# Patient Record
Sex: Female | Born: 1935 | Race: White | Hispanic: No | Marital: Married | Smoking: Current every day smoker
Health system: Southern US, Community
[De-identification: ages and names within clinical notes are randomized; demographics above are authoritative.]

## PROBLEM LIST (undated history)

## (undated) DIAGNOSIS — E785 Hyperlipidemia, unspecified: Secondary | ICD-10-CM

## (undated) DIAGNOSIS — J449 Chronic obstructive pulmonary disease, unspecified: Secondary | ICD-10-CM

## (undated) DIAGNOSIS — I639 Cerebral infarction, unspecified: Secondary | ICD-10-CM

## (undated) DIAGNOSIS — H9190 Unspecified hearing loss, unspecified ear: Secondary | ICD-10-CM

## (undated) DIAGNOSIS — C4490 Unspecified malignant neoplasm of skin, unspecified: Secondary | ICD-10-CM

## (undated) DIAGNOSIS — C569 Malignant neoplasm of unspecified ovary: Secondary | ICD-10-CM

## (undated) DIAGNOSIS — C801 Malignant (primary) neoplasm, unspecified: Secondary | ICD-10-CM

## (undated) DIAGNOSIS — I714 Abdominal aortic aneurysm, without rupture, unspecified: Secondary | ICD-10-CM

## (undated) DIAGNOSIS — R55 Syncope and collapse: Secondary | ICD-10-CM

## (undated) DIAGNOSIS — M810 Age-related osteoporosis without current pathological fracture: Secondary | ICD-10-CM

## (undated) DIAGNOSIS — C50919 Malignant neoplasm of unspecified site of unspecified female breast: Secondary | ICD-10-CM

## (undated) HISTORY — DX: Age-related osteoporosis without current pathological fracture: M81.0

## (undated) HISTORY — DX: Malignant neoplasm of unspecified site of unspecified female breast: C50.919

## (undated) HISTORY — DX: Malignant neoplasm of unspecified ovary: C56.9

## (undated) HISTORY — PX: BREAST LUMPECTOMY: SHX2

## (undated) HISTORY — DX: Cerebral infarction, unspecified: I63.9

## (undated) HISTORY — DX: Chronic obstructive pulmonary disease, unspecified: J44.9

## (undated) HISTORY — DX: Unspecified hearing loss, unspecified ear: H91.90

## (undated) HISTORY — DX: Hyperlipidemia, unspecified: E78.5

## (undated) HISTORY — PX: ABDOMINAL HYSTERECTOMY: SHX81

## (undated) HISTORY — DX: Syncope and collapse: R55

## (undated) HISTORY — DX: Unspecified malignant neoplasm of skin, unspecified: C44.90

## (undated) HISTORY — PX: OTHER SURGICAL HISTORY: SHX169

## (undated) HISTORY — PX: CHOLECYSTECTOMY: SHX55

---

## 2001-10-30 ENCOUNTER — Inpatient Hospital Stay (HOSPITAL_COMMUNITY): Admission: EM | Admit: 2001-10-30 | Discharge: 2001-10-31 | Payer: Self-pay | Admitting: Emergency Medicine

## 2001-10-30 ENCOUNTER — Encounter: Payer: Self-pay | Admitting: Emergency Medicine

## 2005-11-16 ENCOUNTER — Ambulatory Visit (HOSPITAL_COMMUNITY): Admission: RE | Admit: 2005-11-16 | Discharge: 2005-11-16 | Payer: Self-pay | Admitting: Obstetrics and Gynecology

## 2005-11-16 ENCOUNTER — Encounter (INDEPENDENT_AMBULATORY_CARE_PROVIDER_SITE_OTHER): Payer: Self-pay | Admitting: *Deleted

## 2005-12-08 ENCOUNTER — Ambulatory Visit: Admission: RE | Admit: 2005-12-08 | Discharge: 2005-12-08 | Payer: Self-pay | Admitting: Gynecology

## 2006-01-09 ENCOUNTER — Inpatient Hospital Stay (HOSPITAL_COMMUNITY): Admission: RE | Admit: 2006-01-09 | Discharge: 2006-01-12 | Payer: Self-pay | Admitting: Obstetrics and Gynecology

## 2006-01-09 ENCOUNTER — Encounter (INDEPENDENT_AMBULATORY_CARE_PROVIDER_SITE_OTHER): Payer: Self-pay | Admitting: *Deleted

## 2006-11-23 ENCOUNTER — Encounter (INDEPENDENT_AMBULATORY_CARE_PROVIDER_SITE_OTHER): Payer: Self-pay | Admitting: Surgery

## 2006-11-23 ENCOUNTER — Encounter: Admission: RE | Admit: 2006-11-23 | Discharge: 2006-11-23 | Payer: Self-pay | Admitting: Surgery

## 2006-11-23 ENCOUNTER — Ambulatory Visit (HOSPITAL_BASED_OUTPATIENT_CLINIC_OR_DEPARTMENT_OTHER): Admission: RE | Admit: 2006-11-23 | Discharge: 2006-11-23 | Payer: Self-pay | Admitting: Surgery

## 2006-12-04 ENCOUNTER — Ambulatory Visit: Payer: Self-pay | Admitting: Oncology

## 2006-12-19 LAB — CBC WITH DIFFERENTIAL/PLATELET
EOS%: 1.6 % (ref 0.0–7.0)
Eosinophils Absolute: 0.2 10*3/uL (ref 0.0–0.5)
MCH: 33.3 pg (ref 26.0–34.0)
MCV: 93.4 fL (ref 81.0–101.0)
MONO%: 4 % (ref 0.0–13.0)
NEUT#: 7.2 10*3/uL — ABNORMAL HIGH (ref 1.5–6.5)
RBC: 4.68 10*6/uL (ref 3.70–5.32)
RDW: 12.9 % (ref 11.3–14.5)

## 2006-12-22 LAB — COMPREHENSIVE METABOLIC PANEL
ALT: 15 U/L (ref 0–35)
AST: 18 U/L (ref 0–37)
Albumin: 4.3 g/dL (ref 3.5–5.2)
Alkaline Phosphatase: 68 U/L (ref 39–117)
Potassium: 4.3 mEq/L (ref 3.5–5.3)
Sodium: 145 mEq/L (ref 135–145)
Total Protein: 7.1 g/dL (ref 6.0–8.3)

## 2006-12-22 LAB — LACTATE DEHYDROGENASE: LDH: 153 U/L (ref 94–250)

## 2006-12-26 ENCOUNTER — Ambulatory Visit (HOSPITAL_COMMUNITY): Admission: RE | Admit: 2006-12-26 | Discharge: 2006-12-26 | Payer: Self-pay | Admitting: Oncology

## 2007-02-17 ENCOUNTER — Ambulatory Visit: Payer: Self-pay | Admitting: Oncology

## 2007-02-20 ENCOUNTER — Ambulatory Visit (HOSPITAL_COMMUNITY): Admission: RE | Admit: 2007-02-20 | Discharge: 2007-02-20 | Payer: Self-pay | Admitting: Oncology

## 2007-03-01 LAB — CBC WITH DIFFERENTIAL/PLATELET
BASO%: 0.4 % (ref 0.0–2.0)
Basophils Absolute: 0 10*3/uL (ref 0.0–0.1)
EOS%: 0.9 % (ref 0.0–7.0)
HCT: 42.5 % (ref 34.8–46.6)
HGB: 14.9 g/dL (ref 11.6–15.9)
LYMPH%: 23.6 % (ref 14.0–48.0)
MCH: 32.9 pg (ref 26.0–34.0)
MCHC: 35 g/dL (ref 32.0–36.0)
NEUT%: 71.5 % (ref 39.6–76.8)
Platelets: 274 10*3/uL (ref 145–400)

## 2007-03-01 LAB — COMPREHENSIVE METABOLIC PANEL
ALT: 21 U/L (ref 0–35)
BUN: 15 mg/dL (ref 6–23)
CO2: 31 mEq/L (ref 19–32)
Calcium: 9.2 mg/dL (ref 8.4–10.5)
Creatinine, Ser: 1.51 mg/dL — ABNORMAL HIGH (ref 0.40–1.20)
Total Bilirubin: 0.6 mg/dL (ref 0.3–1.2)

## 2007-03-20 LAB — COMPREHENSIVE METABOLIC PANEL
AST: 17 U/L (ref 0–37)
Albumin: 4.1 g/dL (ref 3.5–5.2)
BUN: 15 mg/dL (ref 6–23)
CO2: 25 mEq/L (ref 19–32)
Calcium: 9.1 mg/dL (ref 8.4–10.5)
Chloride: 106 mEq/L (ref 96–112)
Creatinine, Ser: 1.34 mg/dL — ABNORMAL HIGH (ref 0.40–1.20)
Glucose, Bld: 145 mg/dL — ABNORMAL HIGH (ref 70–99)
Potassium: 4.1 mEq/L (ref 3.5–5.3)

## 2007-03-20 LAB — CBC WITH DIFFERENTIAL/PLATELET
Basophils Absolute: 0.1 10*3/uL (ref 0.0–0.1)
EOS%: 1.5 % (ref 0.0–7.0)
Eosinophils Absolute: 0.1 10*3/uL (ref 0.0–0.5)
HCT: 43.2 % (ref 34.8–46.6)
HGB: 15.5 g/dL (ref 11.6–15.9)
MCH: 33.1 pg (ref 26.0–34.0)
MONO#: 0.4 10*3/uL (ref 0.1–0.9)
NEUT#: 6.2 10*3/uL (ref 1.5–6.5)
NEUT%: 67.1 % (ref 39.6–76.8)
RDW: 10.8 % — ABNORMAL LOW (ref 11.3–14.5)
lymph#: 2.4 10*3/uL (ref 0.9–3.3)

## 2007-03-20 LAB — CANCER ANTIGEN 27.29: CA 27.29: 18 U/mL (ref 0–39)

## 2007-03-29 ENCOUNTER — Ambulatory Visit (HOSPITAL_BASED_OUTPATIENT_CLINIC_OR_DEPARTMENT_OTHER): Admission: RE | Admit: 2007-03-29 | Discharge: 2007-03-29 | Payer: Self-pay | Admitting: Urology

## 2007-04-18 ENCOUNTER — Ambulatory Visit (HOSPITAL_COMMUNITY): Admission: RE | Admit: 2007-04-18 | Discharge: 2007-04-18 | Payer: Self-pay | Admitting: Urology

## 2007-05-10 ENCOUNTER — Inpatient Hospital Stay (HOSPITAL_COMMUNITY): Admission: RE | Admit: 2007-05-10 | Discharge: 2007-05-13 | Payer: Self-pay | Admitting: Urology

## 2007-05-16 ENCOUNTER — Ambulatory Visit: Payer: Self-pay | Admitting: Oncology

## 2007-06-03 LAB — CBC WITH DIFFERENTIAL/PLATELET
Basophils Absolute: 0 10*3/uL (ref 0.0–0.1)
EOS%: 1.3 % (ref 0.0–7.0)
Eosinophils Absolute: 0.1 10*3/uL (ref 0.0–0.5)
HGB: 12.9 g/dL (ref 11.6–15.9)
MCV: 95 fL (ref 81.0–101.0)
MONO%: 4.7 % (ref 0.0–13.0)
NEUT#: 6.5 10*3/uL (ref 1.5–6.5)
RBC: 3.87 10*6/uL (ref 3.70–5.32)
RDW: 13.7 % (ref 11.3–14.5)
lymph#: 2.5 10*3/uL (ref 0.9–3.3)

## 2007-06-03 LAB — COMPREHENSIVE METABOLIC PANEL
AST: 16 U/L (ref 0–37)
Albumin: 3.7 g/dL (ref 3.5–5.2)
Alkaline Phosphatase: 43 U/L (ref 39–117)
BUN: 22 mg/dL (ref 6–23)
Calcium: 9 mg/dL (ref 8.4–10.5)
Chloride: 107 mEq/L (ref 96–112)
Glucose, Bld: 132 mg/dL — ABNORMAL HIGH (ref 70–99)
Potassium: 4.2 mEq/L (ref 3.5–5.3)
Sodium: 145 mEq/L (ref 135–145)
Total Protein: 6.4 g/dL (ref 6.0–8.3)

## 2007-12-09 ENCOUNTER — Ambulatory Visit: Payer: Self-pay | Admitting: Oncology

## 2007-12-11 LAB — COMPREHENSIVE METABOLIC PANEL
ALT: 15 U/L (ref 0–35)
CO2: 26 mEq/L (ref 19–32)
Calcium: 9.1 mg/dL (ref 8.4–10.5)
Chloride: 104 mEq/L (ref 96–112)
Creatinine, Ser: 1.33 mg/dL — ABNORMAL HIGH (ref 0.40–1.20)
Glucose, Bld: 170 mg/dL — ABNORMAL HIGH (ref 70–99)

## 2007-12-11 LAB — CBC WITH DIFFERENTIAL/PLATELET
BASO%: 0.3 % (ref 0.0–2.0)
Basophils Absolute: 0 10*3/uL (ref 0.0–0.1)
Eosinophils Absolute: 0.1 10*3/uL (ref 0.0–0.5)
HCT: 44 % (ref 34.8–46.6)
HGB: 15 g/dL (ref 11.6–15.9)
MCHC: 34 g/dL (ref 32.0–36.0)
MONO#: 0.1 10*3/uL (ref 0.1–0.9)
NEUT#: 7.2 10*3/uL — ABNORMAL HIGH (ref 1.5–6.5)
NEUT%: 78.9 % — ABNORMAL HIGH (ref 39.6–76.8)
Platelets: 261 10*3/uL (ref 145–400)
WBC: 9.1 10*3/uL (ref 3.9–10.0)
lymph#: 1.7 10*3/uL (ref 0.9–3.3)

## 2007-12-11 LAB — CANCER ANTIGEN 27.29: CA 27.29: 23 U/mL (ref 0–39)

## 2007-12-11 LAB — LACTATE DEHYDROGENASE: LDH: 144 U/L (ref 94–250)

## 2008-06-09 ENCOUNTER — Ambulatory Visit: Payer: Self-pay | Admitting: Oncology

## 2008-06-12 LAB — COMPREHENSIVE METABOLIC PANEL
ALT: 17 U/L (ref 0–35)
Albumin: 4.1 g/dL (ref 3.5–5.2)
Alkaline Phosphatase: 37 U/L — ABNORMAL LOW (ref 39–117)
CO2: 27 mEq/L (ref 19–32)
Potassium: 4.1 mEq/L (ref 3.5–5.3)
Sodium: 142 mEq/L (ref 135–145)
Total Bilirubin: 0.4 mg/dL (ref 0.3–1.2)
Total Protein: 6.6 g/dL (ref 6.0–8.3)

## 2008-11-10 ENCOUNTER — Ambulatory Visit: Payer: Self-pay | Admitting: Oncology

## 2008-11-17 LAB — CBC WITH DIFFERENTIAL/PLATELET
BASO%: 0.4 % (ref 0.0–2.0)
Basophils Absolute: 0 10*3/uL (ref 0.0–0.1)
Eosinophils Absolute: 0.1 10*3/uL (ref 0.0–0.5)
HCT: 42.1 % (ref 34.8–46.6)
HGB: 14.1 g/dL (ref 11.6–15.9)
LYMPH%: 22.4 % (ref 14.0–49.7)
MCHC: 33.6 g/dL (ref 31.5–36.0)
MONO#: 0.4 10*3/uL (ref 0.1–0.9)
NEUT#: 8.1 10*3/uL — ABNORMAL HIGH (ref 1.5–6.5)
NEUT%: 73.2 % (ref 38.4–76.8)
Platelets: 237 10*3/uL (ref 145–400)
WBC: 11 10*3/uL — ABNORMAL HIGH (ref 3.9–10.3)
lymph#: 2.5 10*3/uL (ref 0.9–3.3)

## 2008-11-17 LAB — COMPREHENSIVE METABOLIC PANEL
ALT: 17 U/L (ref 0–35)
CO2: 28 mEq/L (ref 19–32)
Calcium: 9.6 mg/dL (ref 8.4–10.5)
Chloride: 106 mEq/L (ref 96–112)
Creatinine, Ser: 1.24 mg/dL — ABNORMAL HIGH (ref 0.40–1.20)
Glucose, Bld: 121 mg/dL — ABNORMAL HIGH (ref 70–99)
Total Protein: 6.6 g/dL (ref 6.0–8.3)

## 2009-05-07 ENCOUNTER — Ambulatory Visit: Payer: Self-pay | Admitting: Oncology

## 2009-05-11 LAB — CBC WITH DIFFERENTIAL/PLATELET
Eosinophils Absolute: 0 10*3/uL (ref 0.0–0.5)
HCT: 45.2 % (ref 34.8–46.6)
LYMPH%: 21.4 % (ref 14.0–49.7)
MCV: 99.7 fL (ref 79.5–101.0)
MONO#: 0.2 10*3/uL (ref 0.1–0.9)
MONO%: 1.3 % (ref 0.0–14.0)
NEUT#: 9 10*3/uL — ABNORMAL HIGH (ref 1.5–6.5)
NEUT%: 77 % — ABNORMAL HIGH (ref 38.4–76.8)
Platelets: 271 10*3/uL (ref 145–400)
RBC: 4.54 10*6/uL (ref 3.70–5.45)
WBC: 11.7 10*3/uL — ABNORMAL HIGH (ref 3.9–10.3)

## 2009-05-11 LAB — COMPREHENSIVE METABOLIC PANEL
Alkaline Phosphatase: 37 U/L — ABNORMAL LOW (ref 39–117)
BUN: 15 mg/dL (ref 6–23)
CO2: 29 mEq/L (ref 19–32)
Creatinine, Ser: 1.31 mg/dL — ABNORMAL HIGH (ref 0.40–1.20)
Glucose, Bld: 142 mg/dL — ABNORMAL HIGH (ref 70–99)
Sodium: 142 mEq/L (ref 135–145)
Total Bilirubin: 0.4 mg/dL (ref 0.3–1.2)

## 2009-05-11 LAB — CANCER ANTIGEN 27.29: CA 27.29: 19 U/mL (ref 0–39)

## 2009-06-02 ENCOUNTER — Encounter: Admission: RE | Admit: 2009-06-02 | Discharge: 2009-06-02 | Payer: Self-pay | Admitting: Family Medicine

## 2009-11-01 ENCOUNTER — Ambulatory Visit: Payer: Self-pay | Admitting: Oncology

## 2009-11-02 LAB — CBC WITH DIFFERENTIAL/PLATELET
BASO%: 0.1 % (ref 0.0–2.0)
Basophils Absolute: 0 10*3/uL (ref 0.0–0.1)
HCT: 42.1 % (ref 34.8–46.6)
HGB: 14.4 g/dL (ref 11.6–15.9)
MONO#: 0.4 10*3/uL (ref 0.1–0.9)
NEUT%: 70.8 % (ref 38.4–76.8)
WBC: 10.1 10*3/uL (ref 3.9–10.3)
lymph#: 2.4 10*3/uL (ref 0.9–3.3)

## 2009-11-02 LAB — COMPREHENSIVE METABOLIC PANEL
ALT: 15 U/L (ref 0–35)
Albumin: 4 g/dL (ref 3.5–5.2)
BUN: 20 mg/dL (ref 6–23)
CO2: 24 mEq/L (ref 19–32)
Calcium: 9.1 mg/dL (ref 8.4–10.5)
Chloride: 106 mEq/L (ref 96–112)
Creatinine, Ser: 1.23 mg/dL — ABNORMAL HIGH (ref 0.40–1.20)

## 2009-11-02 LAB — CANCER ANTIGEN 27.29: CA 27.29: 16 U/mL (ref 0–39)

## 2009-12-02 ENCOUNTER — Ambulatory Visit: Payer: Self-pay | Admitting: Oncology

## 2010-04-25 ENCOUNTER — Ambulatory Visit: Payer: Self-pay | Admitting: Oncology

## 2010-04-27 LAB — COMPREHENSIVE METABOLIC PANEL
ALT: 13 U/L (ref 0–35)
AST: 21 U/L (ref 0–37)
Albumin: 3.9 g/dL (ref 3.5–5.2)
Alkaline Phosphatase: 37 U/L — ABNORMAL LOW (ref 39–117)
BUN: 19 mg/dL (ref 6–23)
Calcium: 8.9 mg/dL (ref 8.4–10.5)
Chloride: 104 mEq/L (ref 96–112)
Potassium: 4.2 mEq/L (ref 3.5–5.3)
Sodium: 140 mEq/L (ref 135–145)
Total Protein: 6.5 g/dL (ref 6.0–8.3)

## 2010-04-27 LAB — CBC WITH DIFFERENTIAL/PLATELET
BASO%: 0.2 % (ref 0.0–2.0)
Basophils Absolute: 0 10*3/uL (ref 0.0–0.1)
EOS%: 0.8 % (ref 0.0–7.0)
HGB: 14.8 g/dL (ref 11.6–15.9)
MCH: 33.6 pg (ref 25.1–34.0)
RBC: 4.39 10*6/uL (ref 3.70–5.45)
RDW: 13.6 % (ref 11.2–14.5)
lymph#: 2.6 10*3/uL (ref 0.9–3.3)

## 2010-07-19 ENCOUNTER — Other Ambulatory Visit: Payer: Self-pay | Admitting: Family Medicine

## 2010-11-08 NOTE — Op Note (Signed)
NAME:  Ruth [name], Ruth [name]                  ACCOUNT NO.:  [id number]   MEDICAL RECORD NO.:  [id number]          PATIENT TYPE:  AMB   LOCATION:  [facility]                         FACILITY:  [facility]   PHYSICIAN:  Bertram Millard. Dahlstedt, M.D.DATE OF BIRTH:  [birth year]   DATE OF PROCEDURE:  03/29/2007  DATE OF DISCHARGE:                               OPERATIVE REPORT   PREOPERATIVE DIAGNOSIS:  Right distal ureteral stricture with  hydronephrosis.   POSTOPERATIVE DIAGNOSIS:  Right ureteral stricture due to the surgical  clips, hydronephrosis.   PRINCIPAL PROCEDURE:  Cystoscopy, right retrograde ureteral pyelogram,  balloon dilatation of distal right ureteral stricture, right  ureteroscopy, double-J stent (24 cm x 6-French).   SURGEON:  Bertram Millard. Dahlstedt, M.D.   ANESTHESIA:  General with LMA.   COMPLICATIONS:  None.   BRIEF HISTORY:  77 year old female who presents at this time for  definitive ureteroscopic evaluation of right hydronephrosis.  She  underwent a radical hysterectomy and pelvic lymph node dissection in  2007.  This was for invasive cervical cancer.  She has been followed  since then and was found to have hydronephrosis on a recent ultrasound.  The patient did have a CT scan which confirmed this and did not show any  evident pelvic mass.  She presents at this time for definitive  diagnostic possible therapeutic procedure.  The risks and complications  have been discussed with the patient.  She understands and desires to  proceed.   DESCRIPTION OF PROCEDURE:  The patient was administered preoperative IV  antibiotics.  Her surgical side was marked, and she was taken to the  operating room where general anesthetic was administered using LMA.  She  was placed in the dorsal lithotomy position.  Genitalia and perineum  were prepped and draped.   The bladder was inspected cystoscopically and found to be normal.  The  right ureteral orifice was cannulated with the 6 open-end catheter.  This showed a 1 to 2-cm segment of normal ureter.  There was a very  tight stricture, and proximal to this, it was very hard to fill the  ureter.  However, it was somewhat widened.  I tried passing a guidewire  blindly, and this would not pass up through the stricture.  I then used  the ureteroscope.  At this point, at the strictured area, I saw one  fairly large metal clip and a few small metallic objects consistent with  possible staples.  I was able to navigate a guidewire posterior to the  clips, and the guidewire was advanced into the renal pelvis.  I was  unable to advance a 6 open-ended catheter over the guidewire.  I then  passed a 4-cm 15-French balloon and balloon dilated the stricture.  It  was easily done.  Ureteroscopic evaluation of this area still revealed  these metallic densities within the ureter.  I was able to easily get by  them up into the more proximal ureter which was dilated.  No lesions  were seen.   At this point, I backed the ureteroscope out and then passed a 24-cm x  6-  [place] Contour double-J stent.  This curled proximally and distally.  The bladder was drained and the procedure terminated.   The patient tolerated the procedure well.  She was awakened and taken to  the PACU in stable condition.      Bertram Millard. Dahlstedt, M.D.  Electronically Signed     SMD/MEDQ  D:  03/29/2007  T:  03/29/2007  Job:  [id number]   cc:   Mosetta Putt, M.D.  Fax: [phone]   Juluis Mire, M.D.  Fax: [phone]   Valentino Hue. Magrinat, M.D.  Fax: [phone]

## 2010-11-08 NOTE — H&P (Signed)
NAMEDESI, CARBY                  ACCOUNT NO.:  [id number]   MEDICAL RECORD NO.:  [id number]          PATIENT TYPE:  INP   LOCATION:  1441                         FACILITY:  [facility]   PHYSICIAN:  Bertram Millard. Dahlstedt, M.D.DATE OF BIRTH:  10-05-1932   DATE OF ADMISSION:  05/10/2007  DATE OF DISCHARGE:  05/13/2007                              HISTORY & PHYSICAL   REASON FOR ADMISSION:  Right ureteral stricture.   HISTORY:  This 77 year old female presents at this time for a corrective  procedure for a right ureteral stricture.  She underwent a radical  hysterectomy and right salpingo-oophorectomy and bilateral  lymphadenectomy in July 2007.  She was recently diagnosed with  significant right hydronephrosis.  Further evaluation revealed a dense  stricture in the distal right ureter, most likely related to some small  clips put near the ureter.  She was recently stented.  She had a  renogram which showed 25% function of that right kidney - certainly  worth saving that kidney.  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   It was recommended that she undergo treatment of this right ureteral  stricture with a re-implantation or a Boari flap.  She also had  symptomatic stress urinary incontinence.  She has been a candidate for a  sling for some time.  She presents at this time for surgical management  of her right ureteral stricture and her stress incontinence.   PAST MEDICAL/SURGICAL HISTORY:  1. Significant for a D&C years ago.  2. Cholecystectomy.  3. Left salpingo-oophorectomy due to a cyst in 2003.  4. A cervical conization in May 2007.  5. Suprapubic tube insertion.  6. Radical hysterectomy with right salpingo-oophorectomy and pelvic      lymphadenectomy in July 2007.  7. Left breast lumpectomy in May 2008.  She is followed by Dr. Raymond Gurney C. Magrinat for her cancer.  1. She has hypercholesterolemia.   CURRENT MEDICATIONS:  1. Lovastatin.  2. Tamoxifen.  3. Aspirin.  4. Calcium.  5. Omega-3.  6. Multivitamin.  7. Recently Cipro.   ALLERGIES:  No known drug allergies.   SOCIAL HISTORY:  She is married.  She has a child.  She is a 1-1/2-pack-  per-day smoker for 54 years.  She occasionally drinks.  She is retired.   REVIEW OF SYSTEMS:  Negative except for stress incontinence.   PHYSICAL EXAMINATION:  GENERAL:  A pleasant elderly female.  She is  thin, and in no acute distress.  VITAL SIGNS:  As recorded.  NECK:  Supple without masses.  CHEST:  Clear.  HEART:  Normal rate and rhythm.  ABDOMEN:  Flat, soft, nontender, non-distended.  No masses, no  organomegaly.  Bladder is nonpalpable.  No CVA tenderness or splenic  mass.  Bladder is well-supported.  Increased mobility of the  ureterovesical angle with Valsalva.  PELVIC EXAM:  Uterus is absent.  No pelvic masses.  RECTAL:  Normal anal sphincter tone.  EXTREMITIES:  No peripheral edema.  NEUROLOGIC:  Grossly intact.   ADMISSION LABORATORY DATA:  Calcium 8.2, glucose 141.  Otherwise BMET is  normal.  Hemoglobin normal, white count was normal.  The platelet count  was normal.  Urinalysis revealed large leukocytes, otherwise  unremarkable.   A chest x-ray revealed chronic obstructive pulmonary disease with no  active disease.   Electrocardiogram with a normal sinus rhythm with possible right  ventricular conduction delay.   IMPRESSION:  1. Right ureteral stricture.  2. Stress urinary incontinence.   PLAN:  Admit for right ureteral re-implant and Lynx sling.      Bertram Millard. Dahlstedt, M.D.  Electronically Signed     SMD/MEDQ  D:  05/13/2007  T:  05/13/2007  Job:  [id number]

## 2010-11-08 NOTE — Op Note (Signed)
Ruth [name], Ruth [name]                  ACCOUNT NO.:  [id number]   MEDICAL RECORD NO.:  [id number]          PATIENT TYPE:  INP   LOCATION:  1441                         [facility]ACILITY:  [facility]   PHYSICIAN:  Bertram Millard. Dahlstedt, M.D.DATE OF BIRTH:  Nov 10, 1932   DATE OF PROCEDURE:  05/10/2007  DATE OF DISCHARGE:  05/13/2007                               OPERATIVE REPORT   PREOPERATIVE DIAGNOSIS:  Right ureteral stricture, stress urinary  incontinence.   POSTOPERATIVE DIAGNOSIS:  Right ureteral stricture, stress urinary  incontinence.   PROCEDURE:  Right ureteral reimplantation, right ureterolysis, placement  of Lynx suprapubic sling.   SURGEON:  Bertram Millard. Dahlstedt, M.D.   FIRST ASSISTANT:  Dr. Allena Katz.   FINDINGS:  Dense right ureteral stricture.   ESTIMATED BLOOD LOSS:  150 mL.   COMPLICATIONS:  Small perforation in bladder, discovered with  cystoscopy.   BRIEF HISTORY:  A 77 year old female who presents at this time for a  suprapubic sling as well as the surgical management of a right ureteral  stricture.  She is status post right salpingo-oophorectomy and a radical  hysterectomy and a bilateral pelvic lymph node dissection over a year  ago.  She was recently discovered to have significant right  hydronephrosis and hydroureter.  Evaluation included a cysto and a  ureteroscopy which showed a dense ureteral stricture with a circle of  clips present within the ureter.  A stent was placed and she presents at  this time for definitive management.  She does have some function still  in that right kidney.  She has significant stress urinary incontinence  and desires management of this as well.  She is aware of risks and  complications of the procedure.  These have been discussed with her and  her husband at length.  They understand and desire to proceed.   DESCRIPTION OF PROCEDURE:  The patient was identified in the holding  area and the surgical side marked.  She is taken to the operating  room  where general anesthetic was administered.  She was placed in the  modified lithotomy position.  Genitalia, perineum and abdomen were  prepped and draped.  The Pfannenstiel scar was incised and dissection  was carried down to the rectus fascia with electrocautery.  The fascia  was divided midline and the rectus muscle peeled off of the fascia.  The  rectus was divided in the midline.  Access was gained to the peritoneum.  There were no significant adhesions of the bladder to the bowels, but  the bowels were fairly well adhesed to the ureter underneath which could  be palpated with a stent in place.  Adhesiolysis of dense scars was then  performed, gaining access down to the ureter.  The staples in the ureter  were identified.  The ureter was dissected superiorly such that  approximately 3-4 cm was freed up.  The strictured area was then  excised.  The distal ureteral stump was then oversewn with a 2-0 Vicryl  placed in figure-of-eight fashion.  We filled the bladder up with the  Foley catheter that had been placed.  There was adequate length of the  ureter to perform a fish mouth anastomosis without performing a blind  flap or a psoas hitch.  A cystotomy was then made approximately 1 cm  wide by dissecting down through the muscularis tissue.  The proximal  ureter was then spatulated after the distal end had been freshened up a  bit.  A direct anastomosis was then performed using 4-0 PDS placed in  interrupted simple fashion.  A 6-French 24 cm Cook double-J stent was  then placed prior to closure.  We filled the bladder up and no leak was  seen.  The anastomosis was felt to be adequate.  At this point, two  puncture site wounds were then made just above the pubis on either side  of the midline.  The anterior vaginal wall was infiltrated with 50%  Marcaine with epinephrine.  An incision approximately 2 cm long was made  overlying the urethra.  Flaps were raised bilaterally down to  the  pubocervical fascia.  The [place] needles were then placed and brought  through the vaginal incision on either side of the catheter which was  easily palpable.  The catheter was removed and cystoscopy revealed an  intact anastomosis posteriorly on the right, and seemingly no injury to  the bladder anteriorly.  The [place] sling was then pulled through.  With  the bladder full at this point, on the inside of the patient's abdomen I  could see a little bit of leakage on the right.  There was a small  cystotomy at this point.  I then excised the sling and removed.  The  needles were passed again.  After the needles were passed, close  cystoscopic evaluation of the anterior bladder revealed no evident  needles.  I then pulled the sling through again.  There was excellent  apposition underneath the urethra with a right angle placed in between  the urethra and the sling to create a approximately 1 cm of slack.  With  the bladder full, there was no leak through any portion of bladder that  was observed from the inside of the abdomen.  The sheath from the sling  was then removed, and the sling trimmed just underneath the skin level  into the subcutaneous tissue.  Dermabond was placed to cover the small  puncture sites.  The vaginal mucosa wound was then closed very carefully  over the sling using a running 2-0 Vicryl placed in a simple fashion.  The vagina was irrigated and a pack was placed.  A drain was placed  through a separate stab incision in the left lower quadrant skin with 3-  0 nylon.  It was placed posteriorly to posterior to the bladder near the  anastomosis.  The pelvis was then irrigated.  Sponge was removed and the  sponge count was correct.  The rectus muscle was then reapproximated the  midline using a 2-0 chromic placed in a simple interrupted fashion.  The  rectus fascia was then reapproximated using running #1 PDS placed in a  simple fashion.  Skin edges were then reapproximated  using skin clips.  Dry sterile dressings were placed.  Catheter was left indwelling.   The patient tolerated the procedure well.  She was awakened and taken to  PACU in stable condition.      Bertram Millard. Dahlstedt, M.D.  Electronically Signed     SMD/MEDQ  D:  05/13/2007  T:  05/13/2007  Job:  [id number]   cc:  Juluis Mire, M.D.  Fax: [phone]   Valentino Hue. Magrinat, M.D.  Fax: [phone]

## 2010-11-08 NOTE — Op Note (Signed)
NAME:  Ruth [name], Ruth [name]                  ACCOUNT NO.:  [id number]   MEDICAL RECORD NO.:  [id number]          PATIENT TYPE:  AMB   LOCATION:  DSC                          FACILITY:  [facility]   PHYSICIAN:  Thomas A. Cornett, M.D.DATE OF BIRTH:  [birth year]   DATE OF PROCEDURE:  11/23/2006  DATE OF DISCHARGE:                               OPERATIVE REPORT   PREOPERATIVE DIAGNOSIS:  Left breast cancer.   POSTOPERATIVE DIAGNOSIS:  Left breast cancer.   PROCEDURE.:  1. Left breast needle-localized lumpectomy.  2. Left axillary sentinel lymph node mapping with injection of      methylene blue dye.   SURGEON:  Thomas Cornett.   ASSISTANT:  None.   ANESTHESIA:  LMA with 10 mL of 0.25% Sensorcaine local.   ESTIMATED BLOOD LOSS:  50 mL.   SPECIMEN:  1. Left breast mass to pathology.  2. Three left axillary sentinel nodes negative by touch prep.   INDICATIONS FOR PROCEDURE:  The patient is a 77 year old female found  with a left breast mass, found to be left breast cancer.  She wanted  breast conservation and presents today for left breast lumpectomy as  well as left sentinel lymph node mapping procedure.   DESCRIPTION OF PROCEDURE:  The patient was brought to the operating room  and placed supine.  After induction of general anesthesia, I injected 4  mL of methylene blue dye after using alcohol prep in a subareolar  position.  After 5 minutes, she was prepped and draped in sterile  fashion.  A NeoProbe was used, and the left axilla was interrogated.  A  hot spot was found.  Local anesthesia was infiltrated, and a small  incision was made in her left axillary region.  Dissection was carried  down into the Level I lymph nodes.  I found three hot lymph nodes and  sent these to pathology, and there were negative by touch prep.  Unfortunately, only one had blue dye in it.  The remainder of the axilla  was baseline.  Both internal mammary and supraclavicular nodes were at  baseline.  Next, we  turned our attention to the left breast mass.  This  was located in the left upper inner quadrant just under the nipple at  about 11 o'clock.  Needle localization was used preoperatively.  Curvilinear incision was made, and dissection was carried down in this  region.  The tumor felt to be up under the nipple, and I had to dissect  all the tissue from the undersurface of the nipple away to ensure that I  had adequate margins.  This tumor was actually quite central and not so  much in the upper inner quadrant as I initially thought.  We were able  to dissect circumferentially around this and took a very large margin in  this setting without difficulty.  The tumor is oriented and sent for  radiograph, found to be adequate.  Wire and clip were in the specimen.  Unfortunately, given the central location of this tumor, I mobilized  some of the lateral breast tissue back toward the  nipple areolar complex  and sutured this down to the chest wall inferiorly.  This helped filled  the void, but there still was somewhat of a defect here from the  location of the tumor being centrally located.  I was able to pull some  more tissue in this area to make this defect much less noticeable, but  unfortunately, she had a small amount of dimpling in this area.  I felt  with fluid and healing of this area it should look relatively contour.  Hemostasis was excellent in the biopsy cavity.  The skin was then closed  with 4-0 Monocryl in a subcuticular fashion.  Steri-Strips and dry  dressings were applied.  The left axilla was closed with 3-0 Vicryl and  4-0 Monocryl in a subcuticular fashion.  Steri-Strips and dry dressings  were applied.  All final counts of sponge, needle and instruments were  found be correct at this portion of the case.  The patient was then  awoke, taken to recovery in satisfactory condition.      Thomas A. Cornett, M.D.  Electronically Signed     TAC/MEDQ  D:  11/23/2006  T:   11/23/2006  Job:  [id number]   cc:   Mosetta Putt, M.D.

## 2010-11-11 NOTE — Consult Note (Signed)
NAME:  Ruth [name], Ruth [name] NO.:  [id number]   MEDICAL RECORD NO.:  [id number]          PATIENT TYPE:  OUT   LOCATION:  GYN                          FACILITY:  [facility]   PHYSICIAN:  De Blanch, M.D.DATE OF BIRTH:  26-Mar-1933   DATE OF CONSULTATION:  DATE OF DISCHARGE:                                   CONSULTATION   CONSULTING PHYSICIAN:  De Blanch, M.D.   A 77 year old white female seen in consultation at the request of Dr. Juluis Mire regarding management of a newly diagnosed cervical cancer.   The patient has not had a Pap smear in a number of years and had one  recently showing at least high-grade dysplasia.  Ultimately, the patient  underwent a cold knife conization that showed extensive high-grade squamous  dysplasia which was not only present in the cone specimen but also in the  endocervical and endometrial curettings obtained after the cone.  The cone  specimen itself had mostly extensive carcinoma in situ, however, there is an  area of superficial invasion which is right at the endocervical margin and  the depth of invasion cannot be determined.  Further, we cannot determine  whether there is disease above the surgical margin.  She has had an  uncomplicated recovery from that surgical procedure.   PAST MEDICAL HISTORY:   MEDICAL ILLNESSES:  Hypercholesterolemia.   PAST SURGICAL HISTORY:  1.  Left salpingo-oophorectomy for benign ovarian cyst.  2.  Open cholecystectomy.  3.  D&C for incomplete abortion.  4.  Cold knife conization as noted above.   CURRENT MEDICATIONS:  __________  , aspirin, and calcium.   DRUG ALLERGIES:  None.   OBSTETRICAL HISTORY:  Gravida 2.   FAMILY HISTORY:  Negative for gynecologic, breast, or colon cancer.  The  patient had a negative mammogram recently.   REVIEW OF SYSTEMS:  A 10-point comprehensive review of systems is negative.  The patient apparently has had a recent aortic ultrasound for a  question of  an aortic aneurysm.  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  The patient is asymptomatic and has no evidence  of claudication or other peripheral vascular disease.   PHYSICAL EXAMINATION:  VITAL SIGNS:  Height 5 feet, weight 133 pounds, blood  pressure 138/70, pulse 92, respiratory rate 18.  GENERAL:  The patient is pleasant, healthy, white female in no acute  distress.  HEENT:  Negative.  NECK:  Supple without thyromegaly.  There is no supraclavicular or inguinal  adenopathy.  ABDOMEN:  Soft and nontender.  There is an abdominal pulsation which is not  extreme.  No mass, organomegaly, ascites, or hernias are noted.  PELVIC:  EG/BUS, vagina, bladder, urethra are normal but slightly atrophic.  Cervix is post conization.  There is still some bloody exudate.  There is no  gross tumor noted.  Bimanual and rectovaginal exam reveal an anterior  uterus.  No adnexal masses noted and there is no parametrial involvement.  LOWER EXTREMITIES:  Without edema or varicosities.   IMPRESSION:  Invasive cervical cancer with extensive cervical high-grade  dysplasia.   I have a lengthy discussion with the patient and her daughter regarding this  dilemma we have because of the positive surgical margin.  On the one hand,  one could repeat the conization in hopes of determining whether there is any  deeper invasion, versus proceeding with total abdominal hysterectomy, versus  radical hysterectomy with pelvic lymphadenectomy.  We discussed the pros and  cons of each of these, the risks and benefits.  She is aware that should she  have an extra fascial hysterectomy and have invasive cervical cancer, she  would require postoperative pelvic radiation therapy.  On the other hand, if  she has a radical hysterectomy with pelvic lymphadenectomy, she potentially  could be over treated.  The  biggest concern would be surgical morbidity  including bladder dysfunction.  The risks of a repeat conization were also  discussed as well as the need for definitive therapy after the second  conization.  After discussing all the pros and cons at some length with a  total face time of approximately 30 minutes, the patient has elected to  proceed with a radical hysterectomy in hopes that this will be the most  definitive therapy for the potential worst disease.  Again, I reiterated the  fact that she will require a suprapubic catheter and will likely have some  degree of bladder dysfunction at least for a period of time.  She accepts  these risks and wishes to go ahead with surgery which will be scheduled in  conjunction with Dr. Arelia Sneddon for January 19, 2006.  She will undergo  preoperative studies prior to that time.  The risks of surgery including  hemorrhage, infection, injury to adjacent viscera, thrombolic complications,  and anesthetic risks were outlined.  The patient accepts these risks and  wishes to proceed.      De Blanch, M.D.  Electronically Signed     DC/MEDQ  D:  12/08/2005  T:  12/08/2005  Job:  [id number]   cc:   Juluis Mire, M.D.  Fax: [phone]   Telford Nab, R.N.  [address]  [place], [place] [address]   Mosetta Putt, M.D.  Fax: [phone]

## 2010-11-11 NOTE — Discharge Summary (Signed)
Ruth [name], Ruth [name]                  ACCOUNT NO.:  [id number]   MEDICAL RECORD NO.:  [id number]          PATIENT TYPE:  INP   LOCATION:  1441                         FACILITY:  [facility]   PHYSICIAN:  Bertram Millard. Dahlstedt, M.D.DATE OF BIRTH:  October 03, 1932   DATE OF ADMISSION:  05/10/2007  DATE OF DISCHARGE:  05/13/2007                               DISCHARGE SUMMARY   PRIMARY DIAGNOSES:  1. Right ureteral obstruction.  2. Hydronephrosis.  3. Stress urinary incontinence   PRINCIPAL PROCEDURE:  1. May 10, 2007 ureteroneocystostomy.  2. Ureteral adhesiolysis.  3. Placement of Lynx retropubic sling.   BRIEF HISTORY:  A 77 year old female admitted for a right ureteral  reimplantation.  She has a history of right ureteral stricture and  subsequent hydronephrosis.  She does have fair renal function on that  side.   At the same time, she has significant stress urinary incontinence.  She  has talked to me about treatment of this.  A brief evaluation revealed  fairly significant typical stress urinary incontinence.   The patient has a stent in place at the present time.  She is aware of  the risks and complications of a reimplantation on the right, having  discussed this with her and her husband at length in the office on  several occasions.  She is admitted at this time for those procedures.   PAST MEDICAL HISTORY:  Significant for D&C years ago.  She is status  post cholecystectomy, left salpingo-oophorectomy in 2003 and cervical  conization in May 2007.  She had a suprapubic tube in place in the past.  She had a radical hysterectomy with right salpingo-oophorectomy and  pelvic lymphadenectomy in July 2007.  She had a left breast lumpectomy  in 2008.  She has hypercholesterolemia.   For the reading physical exam, please see the admission note.   HOSPITAL COURSE:  The patient was admitted directly to the operating  room where she underwent a mid urethral sling and right ureteral  reimplantation.  She tolerated the procedures well.  We did not need to  perform a Boari flap or psoas hitch on this patient.  A drain was left  in perioperatively, drainage output taper off nicely.  She was advanced  to a regular diet without difficulty.  We treated her initially with a  PCA pump.  She was eventually weaned off of this, and her JP drain was  discontinued.   By May 13, 2007, she was doing quite well.  She had a bowel  movement, she was very comfortable and she was having flatus.  Her wound  appeared to be healing well.  She was discharged to home at that time.  She was discharged with her staples and her Foley catheter in place.   DISCHARGE MEDICATIONS:  Included her usual medications - lovastatin,  tamoxifen, aspirin, calcium, Omega-3 supplements, multivitamins, as well  as hydrocodone for pain, stool softener and Cipro.  She will followup in  approximately 1 week in my office.      Bertram Millard. Dahlstedt, M.D.  Electronically Signed     SMD/MEDQ  D:  07/01/2007  T:  07/01/2007  Job:  [id number]   cc:   Juluis Mire, M.D.  Fax: [phone]

## 2010-11-11 NOTE — Cardiovascular Report (Signed)
[place]  Patient:    Ruth [name], Ruth [name] Visit Number: [id number] MRN: [id number]          Service Type: MED Location: [phone] Attending Physician:  Ruth [name] Dictated by:   Ruth [name], M.D. [facility] Proc. Date: 10/30/01 Admit Date:  10/30/2001 Discharge Date: 10/31/2001   CC:         Ruth [name], M.D.  Ruth [name], M.D. [facility]  Cardiopulmonary Lab   Cardiac Catheterization  CLINICAL HISTORY:  [name] is 77 years old and had no prior history of known heart disease.  She gave blood this morning and then went shopping with her daughter in [employer].  She went to the bathroom and while she was in the bathroom sitting on the commode she felt nauseated and weak, and the next thing she remembers she was on the floor being revived.  EMS was called and she was brought to the emergency room at [facility].  Her EKG showed diffuse 1 mm ST segment elevation in most all of the leads. Dr. Chales [name] saw her and was concerned that her symptoms and EKG might represent acute coronary syndrome, and we arranged for her to be evaluated in the catheterization lab with angiography.  DESCRIPTION OF PROCEDURE:  The procedure was performed by the right femoral artery with arterial sheath and 6-French preformed coronary catheters.  A front wall arterial puncture was performed and Omnipaque contrast was used. The patient tolerated the procedure well and left the laboratory in satisfactory condition.  RESULTS: 1. LEFT MAIN CORONARY ARTERY:  Free of significant disease. 2. LEFT ANTERIOR DESCENDING ARTERY:  Gave rise to a first diagonal branch, a    large septal perforator and two more diagonal branches.  There was 30%    narrowing in the proximal LAD, and there was a mild aneurysmal    dilatation just before the first diagonal branch.  There was 30%    narrowing after the first diagonal branch and the first septal perforator. 3. CIRCUMFLEX  ARTERY:  Gave rise to an intermediate branch, a small marginal    branch, an atrial branch and a posterolateral branch.  These vessels were    free of significant disease. 4. RIGHT CORONARY ARTERY:  A moderate sized vessel that gave rise to a conus    branch, two small right ventricular branches, a posterior descending branch    and two posterolateral branches.  The right coronary artery was irregular;    there were no major obstruction.  LEFT VENTRICULOGRAM:  Performed in the RAO projection, showed good wall motion and no areas of hypokinesis.  The estimated ejection fraction was 60%.  HEMODYNAMICS: 1. Aortic pressure:  134/65 with mean of 93. 2. Left ventricular pressure:  134/17.  CONCLUSIONS: 1. Nonobstructive coronary artery disease, with    a. 30% narrowing in the proximal and mid left anterior descending artery.    b. No obstruction in the circumflex artery.    c. Mild irregularities in the right coronary artery. 2. Normal left ventricular function.  RECOMMENDATIONS:  There is no source for ischemia.  In view of these findings I think the patients recent episode probably represents vasovagal syncope. She does not have a previous history of such events, so I think we probably do not need to work this up further unless she has recurrent symptoms.  Will plan to keep her in the hospital on telemetry for 24 hr, and await the results of her blood  tests.  If this is all negative, she can probably be discharged home. Dictated by:   Ruth [name], M.D. [facility] Attending Physician:  Ruth [name] DD:  10/30/01 TD:  11/01/01 Job: [id number]

## 2010-12-09 ENCOUNTER — Other Ambulatory Visit: Payer: Self-pay | Admitting: Oncology

## 2010-12-09 ENCOUNTER — Encounter (HOSPITAL_BASED_OUTPATIENT_CLINIC_OR_DEPARTMENT_OTHER): Payer: Medicare Other | Admitting: Oncology

## 2010-12-09 DIAGNOSIS — C50919 Malignant neoplasm of unspecified site of unspecified female breast: Secondary | ICD-10-CM

## 2010-12-09 DIAGNOSIS — M810 Age-related osteoporosis without current pathological fracture: Secondary | ICD-10-CM

## 2010-12-09 DIAGNOSIS — C50219 Malignant neoplasm of upper-inner quadrant of unspecified female breast: Secondary | ICD-10-CM

## 2010-12-09 DIAGNOSIS — C50119 Malignant neoplasm of central portion of unspecified female breast: Secondary | ICD-10-CM

## 2010-12-09 DIAGNOSIS — Z170 Estrogen receptor positive status [ER+]: Secondary | ICD-10-CM

## 2010-12-09 LAB — CBC WITH DIFFERENTIAL/PLATELET
BASO%: 0.5 % (ref 0.0–2.0)
EOS%: 2.2 % (ref 0.0–7.0)
MCH: 33 pg (ref 25.1–34.0)
MCHC: 33.8 g/dL (ref 31.5–36.0)
MCV: 97.7 fL (ref 79.5–101.0)
MONO%: 5.1 % (ref 0.0–14.0)
RDW: 13.2 % (ref 11.2–14.5)
lymph#: 1.8 10*3/uL (ref 0.9–3.3)

## 2010-12-09 LAB — COMPREHENSIVE METABOLIC PANEL
ALT: 50 U/L — ABNORMAL HIGH (ref 0–35)
AST: 31 U/L (ref 0–37)
Albumin: 3.2 g/dL — ABNORMAL LOW (ref 3.5–5.2)
Alkaline Phosphatase: 124 U/L — ABNORMAL HIGH (ref 39–117)
Calcium: 9.7 mg/dL (ref 8.4–10.5)
Chloride: 102 mEq/L (ref 96–112)
Creatinine, Ser: 1.69 mg/dL — ABNORMAL HIGH (ref 0.50–1.10)
Potassium: 3.9 mEq/L (ref 3.5–5.3)

## 2011-01-11 ENCOUNTER — Other Ambulatory Visit: Payer: Self-pay | Admitting: Family Medicine

## 2011-03-14 ENCOUNTER — Other Ambulatory Visit: Payer: Self-pay | Admitting: Family Medicine

## 2011-04-04 LAB — COMPREHENSIVE METABOLIC PANEL
AST: 20
Albumin: 3 — ABNORMAL LOW
BUN: 14
Calcium: 8.7
Chloride: 101
Creatinine, Ser: 1.1
GFR calc Af Amer: 59 — ABNORMAL LOW
Total Protein: 5.9 — ABNORMAL LOW

## 2011-04-04 LAB — CBC
HCT: 37.7
Hemoglobin: 12
MCHC: 34.9
MCHC: 35
MCV: 94.1
MCV: 94.9
MCV: 94.4
Platelets: 234
Platelets: 255
Platelets: 268
RBC: 4.25
RDW: 12.7
RDW: 13.3
RDW: 13.3
WBC: 13.5 — ABNORMAL HIGH
WBC: 18.8 — ABNORMAL HIGH
WBC: 9.4

## 2011-04-04 LAB — DIFFERENTIAL
Basophils Absolute: 0
Basophils Relative: 0
Eosinophils Absolute: 0.2
Eosinophils Relative: 2
Lymphocytes Relative: 12

## 2011-04-04 LAB — URINE MICROSCOPIC-ADD ON

## 2011-04-04 LAB — BASIC METABOLIC PANEL
BUN: 11
BUN: 14
CO2: 29
Calcium: 8.4
Calcium: 8.6
Chloride: 103
Chloride: 103
Chloride: 105
Creatinine, Ser: 1.17
Creatinine, Ser: 1.26 — ABNORMAL HIGH
GFR calc Af Amer: 51 — ABNORMAL LOW
GFR calc non Af Amer: 42 — ABNORMAL LOW
GFR calc non Af Amer: 45 — ABNORMAL LOW
Glucose, Bld: 157 — ABNORMAL HIGH
Glucose, Bld: 141 — ABNORMAL HIGH
Potassium: 3.9
Sodium: 136

## 2011-04-04 LAB — URINALYSIS, ROUTINE W REFLEX MICROSCOPIC
Glucose, UA: NEGATIVE
Nitrite: NEGATIVE
pH: 5.5

## 2011-04-06 LAB — POCT HEMOGLOBIN-HEMACUE: Operator id: 268271

## 2011-06-06 ENCOUNTER — Other Ambulatory Visit: Payer: Self-pay | Admitting: Oncology

## 2011-06-06 ENCOUNTER — Other Ambulatory Visit (HOSPITAL_BASED_OUTPATIENT_CLINIC_OR_DEPARTMENT_OTHER): Payer: Medicare Other | Admitting: Lab

## 2011-06-06 DIAGNOSIS — C50919 Malignant neoplasm of unspecified site of unspecified female breast: Secondary | ICD-10-CM

## 2011-06-06 DIAGNOSIS — Z170 Estrogen receptor positive status [ER+]: Secondary | ICD-10-CM

## 2011-06-06 DIAGNOSIS — M810 Age-related osteoporosis without current pathological fracture: Secondary | ICD-10-CM

## 2011-06-06 LAB — CBC WITH DIFFERENTIAL/PLATELET
BASO%: 0.3 % (ref 0.0–2.0)
EOS%: 0.4 % (ref 0.0–7.0)
LYMPH%: 23.4 % (ref 14.0–49.7)
MCHC: 33.5 g/dL (ref 31.5–36.0)
MCV: 98.1 fL (ref 79.5–101.0)
MONO%: 4.2 % (ref 0.0–14.0)
NEUT#: 6.6 10*3/uL — ABNORMAL HIGH (ref 1.5–6.5)
Platelets: 228 10*3/uL (ref 145–400)
RBC: 4.24 10*6/uL (ref 3.70–5.45)
RDW: 14 % (ref 11.2–14.5)

## 2011-06-07 LAB — COMPREHENSIVE METABOLIC PANEL
ALT: 12 U/L (ref 0–35)
AST: 19 U/L (ref 0–37)
Albumin: 3.7 g/dL (ref 3.5–5.2)
Alkaline Phosphatase: 35 U/L — ABNORMAL LOW (ref 39–117)
Potassium: 3.9 mEq/L (ref 3.5–5.3)
Sodium: 139 mEq/L (ref 135–145)
Total Bilirubin: 0.3 mg/dL (ref 0.3–1.2)
Total Protein: 5.7 g/dL — ABNORMAL LOW (ref 6.0–8.3)

## 2011-06-07 LAB — VITAMIN D 25 HYDROXY (VIT D DEFICIENCY, FRACTURES): Vit D, 25-Hydroxy: 47 ng/mL (ref 30–89)

## 2011-06-13 ENCOUNTER — Ambulatory Visit (HOSPITAL_BASED_OUTPATIENT_CLINIC_OR_DEPARTMENT_OTHER): Payer: Medicare Other | Admitting: Oncology

## 2011-06-13 VITALS — BP 155/72 | HR 90 | Temp 98.3°F | Ht 61.0 in | Wt 113.8 lb

## 2011-06-13 DIAGNOSIS — Z170 Estrogen receptor positive status [ER+]: Secondary | ICD-10-CM

## 2011-06-13 DIAGNOSIS — C50919 Malignant neoplasm of unspecified site of unspecified female breast: Secondary | ICD-10-CM

## 2011-06-13 DIAGNOSIS — C50912 Malignant neoplasm of unspecified site of left female breast: Secondary | ICD-10-CM | POA: Insufficient documentation

## 2011-06-13 DIAGNOSIS — Z79810 Long term (current) use of selective estrogen receptor modulators (SERMs): Secondary | ICD-10-CM

## 2011-06-13 DIAGNOSIS — M810 Age-related osteoporosis without current pathological fracture: Secondary | ICD-10-CM

## 2011-06-13 NOTE — Progress Notes (Signed)
ID: Ruth [name]   Interval History:   Ruth [name] returns today for followup of her breast cancer. Interval history is generally unremarkable. [facility] once a week. Other days "there 6 to do around the house".  ROS:  She has a slight vaginal discharge secondary to the tamoxifen. This has been unchanged, is not itchy or smelly, and doesn't require any intervention on her part. She had a skin cancer removed from her forehead and a basal cell from her right cheek. Otherwise a detailed review of systems was noncontributory.   Medications: I have reviewed the patient's current medications.  No current outpatient prescriptions on file.   The patient is on tamoxifen 20 mg daily and alendronate 70 mg weekly.  Objective:  Filed Vitals:   06/13/11 1350  BP: 155/72  Pulse: 90  Temp: 98.3 F (36.8 C)   Body mass index is 21.50 kg/(m^2).  Physical Exam:   Sclerae unicteric  Oropharynx clear  No peripheral adenopathy  Lungs clear -- no rales or rhonchi  Heart regular rate and rhythm  Abdomen benign  MSK no focal spinal tenderness, no peripheral edema  Neuro nonfocal  Breast exam: Right breast no suspicious finding left breast status post lumpectomy, no evidence of local recurrence  Lab Results:  CMP    Chemistry      Component Value Date/Time   NA 139 06/06/2011 1403   NA 139 06/06/2011 1403   NA 139 06/06/2011 1403   NA 139 06/06/2011 1403   K 3.9 06/06/2011 1403   K 3.9 06/06/2011 1403   K 3.9 06/06/2011 1403   K 3.9 06/06/2011 1403   CL 102 06/06/2011 1403   CL 102 06/06/2011 1403   CL 102 06/06/2011 1403   CL 102 06/06/2011 1403   CO2 28 06/06/2011 1403   CO2 28 06/06/2011 1403   CO2 28 06/06/2011 1403   CO2 28 06/06/2011 1403   BUN 20 06/06/2011 1403   BUN 20 06/06/2011 1403   BUN 20 06/06/2011 1403   BUN 20 06/06/2011 1403   CREATININE 1.26* 06/06/2011 1403   CREATININE 1.26* 06/06/2011 1403   CREATININE 1.26* 06/06/2011 1403     CREATININE 1.26* 06/06/2011 1403      Component Value Date/Time   CALCIUM 9.1 06/06/2011 1403   CALCIUM 9.1 06/06/2011 1403   CALCIUM 9.1 06/06/2011 1403   CALCIUM 9.1 06/06/2011 1403   ALKPHOS 35* 06/06/2011 1403   ALKPHOS 35* 06/06/2011 1403   ALKPHOS 35* 06/06/2011 1403   ALKPHOS 35* 06/06/2011 1403   AST 19 06/06/2011 1403   AST 19 06/06/2011 1403   AST 19 06/06/2011 1403   AST 19 06/06/2011 1403   ALT 12 06/06/2011 1403   ALT 12 06/06/2011 1403   ALT 12 06/06/2011 1403   ALT 12 06/06/2011 1403   BILITOT 0.3 06/06/2011 1403   BILITOT 0.3 06/06/2011 1403   BILITOT 0.3 06/06/2011 1403   BILITOT 0.3 06/06/2011 1403       CBC Lab Results  Component Value Date   WBC 9.2 06/06/2011   HGB 13.9 06/06/2011   HCT 41.6 06/06/2011   MCV 98.1 06/06/2011   PLT 228 06/06/2011   NEUTROABS 6.6* 06/06/2011    Studies/Results:  Mammography at [facility] May of 2012 was unremarkable  Assessment: This is a 77 year old [place] woman status post left lumpectomy and sentinel lymph node biopsy May 2008 for a T1c N0 grade 2 invasive lobular carcinoma, which was  ER positive, but PR and HER2 negative, with an MIB-1 of 17%.  She received no radiation and has been on tamoxifen since July 2008 with good tolerance.  She is status post [facility] July 2007 for CIN-3 of the cervix, the final pathology showing no residual disease and with a 0 of 14 lymph nodes involved.   Plan: She will return to see [place] June of 2013, and that will be 5 years out from her original surgery. The new data suggests that 10 years of tamoxifen may be of benefit as opposed to 5, and in her case, since tamoxifen is also helping with her osteoporosis, that may be a reasonable choice. In any case after that visit we would see her on a once a year basis for the next 5 years.  MAGRINAT,GUSTAV C 06/13/2011

## 2012-02-04 ENCOUNTER — Other Ambulatory Visit: Payer: Self-pay | Admitting: Oncology

## 2012-02-04 DIAGNOSIS — C50919 Malignant neoplasm of unspecified site of unspecified female breast: Secondary | ICD-10-CM

## 2012-02-05 ENCOUNTER — Telehealth: Payer: Self-pay | Admitting: Oncology

## 2012-02-05 NOTE — Telephone Encounter (Signed)
S/w the pt and she is aware of her oct appts °

## 2012-03-26 ENCOUNTER — Telehealth: Payer: Self-pay | Admitting: *Deleted

## 2012-03-26 ENCOUNTER — Ambulatory Visit (HOSPITAL_BASED_OUTPATIENT_CLINIC_OR_DEPARTMENT_OTHER): Payer: Medicare Other | Admitting: Oncology

## 2012-03-26 ENCOUNTER — Other Ambulatory Visit (HOSPITAL_BASED_OUTPATIENT_CLINIC_OR_DEPARTMENT_OTHER): Payer: Medicare Other | Admitting: Lab

## 2012-03-26 VITALS — BP 132/88 | HR 99 | Temp 98.5°F | Resp 20 | Ht 61.0 in | Wt 105.1 lb

## 2012-03-26 DIAGNOSIS — C50919 Malignant neoplasm of unspecified site of unspecified female breast: Secondary | ICD-10-CM

## 2012-03-26 DIAGNOSIS — Z170 Estrogen receptor positive status [ER+]: Secondary | ICD-10-CM

## 2012-03-26 DIAGNOSIS — C50019 Malignant neoplasm of nipple and areola, unspecified female breast: Secondary | ICD-10-CM

## 2012-03-26 LAB — COMPREHENSIVE METABOLIC PANEL (CC13)
Albumin: 3.4 g/dL — ABNORMAL LOW (ref 3.5–5.0)
BUN: 19 mg/dL (ref 7.0–26.0)
CO2: 26 mEq/L (ref 22–29)
Glucose: 89 mg/dl (ref 70–99)
Potassium: 4.6 mEq/L (ref 3.5–5.1)
Sodium: 140 mEq/L (ref 136–145)
Total Bilirubin: 0.4 mg/dL (ref 0.20–1.20)
Total Protein: 6.6 g/dL (ref 6.4–8.3)

## 2012-03-26 LAB — CBC WITH DIFFERENTIAL/PLATELET
Basophils Absolute: 0.1 10*3/uL (ref 0.0–0.1)
EOS%: 1.6 % (ref 0.0–7.0)
HCT: 43 % (ref 34.8–46.6)
HGB: 14.2 g/dL (ref 11.6–15.9)
LYMPH%: 17.7 % (ref 14.0–49.7)
MCH: 32.7 pg (ref 25.1–34.0)
MCV: 99 fL (ref 79.5–101.0)
NEUT%: 73.6 % (ref 38.4–76.8)
Platelets: 222 10*3/uL (ref 145–400)
lymph#: 1.4 10*3/uL (ref 0.9–3.3)

## 2012-03-26 MED ORDER — TAMOXIFEN CITRATE 20 MG PO TABS: 20.0000 mg | ORAL_TABLET | Freq: Every day | ORAL | Status: AC

## 2012-03-26 NOTE — Progress Notes (Signed)
ID: Ruth [name]   DOB: April 25, 1933  MR#: [id number]  CSN#:[id number]  PCP: Carolyne Fiscal, MD GYN:  SU: Harriette Bouillon MD OTHER MD:   HISTORY OF PRESENT ILLNESS: The patient had a screening mammogram at [facility] on 10-17-06 which showed some asymmetry in the left breast.  Additional views were obtained on 10-25-06 and showed approximately a 10 mm. nodular density in the retroareolar region of the left breast.  Ultrasound was obtained and showed it to be a hypoechoic mass measuring 10.8 mm.  Biopsy was obtained the same day and showed ([id number] and [id number]) invasive lobular breast cancer which was 97% estrogen receptor positive, 0% progesterone receptor with a proliferation marker of 17% and HER-2/neu negative at 0.    With this information, the patient was referred to Dr. Luisa Hart and on 10-31-06 bilateral breast MRI's were obtained at [facility].  This showed a bilobed 8 mm. spiculated nodule in the left breast with no evidence of axillary involvement and nothing of importance in the right breast.    With this information, after appropriate discussion, the patient proceeded to left lumpectomy and sentinel lymph node biopsy under Dr. Luisa Hart on 11-23-06.  The final pathology ([id number]) confirmed an invasive lobular carcinoma measuring 1.5 cm., grade 2, with no evidence of lymphovascular invasion, adequate margins and 0 of 3 sentinel lymph nodes involved. Her subsequent history is as detailed below.  INTERVAL HISTORY: [place] returns today for followup of her breast cancer. Since her last visit here she and her husband Channing Mutters have moved to [place]. She still in the middle of the move, and she "hurts all over" from carrying things. She does say the food is good there.  REVIEW OF SYSTEMS:  PAST MEDICAL HISTORY: No past medical history on file. Significant for the patient being status post hysterectomy and bilateral salpingo-oophorectomy under Dr. Richardean Chimera and Dr.  De Blanch on 01-09-06.  The final pathology of that surgery ([id number]) showed no residual disease and a total of 14 lymph nodes were negative for carcinoma.  On 11-16-05 the patient had endocervical curettage showing a high grade squamous intraepithelial lesion, CIN-3. The patient is followed by Dr. Arelia Sneddon for this problem. She is also status post cholecystectomy.  She has a history of hypercholesterolemia, osteoporosis, bilateral hearing loss, question of hypertension and ongoing tobacco abuse.  She has an ascending thoracic aortic aneurysm which measured 3.8 cm. by the recent breast MRI and she has problems with bladder atony and stress incontinence.  PAST SURGICAL HISTORY: No past surgical history on file.  FAMILY HISTORY No family history on file. The patient's father died at the age of 41 from a myocardial infarction.  The patient's mother died at the age of 51 from a stroke.  She has two sisters and one brother.  No history of breast or ovarian cancer in the family.    GYNECOLOGIC HISTORY: She is GX P2, first pregnancy age 9, change of life age 56.  She did not take hormone replacement.  SOCIAL HISTORY: She used to work in Clinical biochemist.  She has been married 47+ years to [place] who is present today.  He is a semi-retired Comptroller.  Their daughter, Darl Pikes, is a Herbalist.  She works for a [employer].  Their son, Onalee Hua, works in Arts administrator and lives in [place].  The patient has one grandchild.  She is not a Advice worker.    ADVANCED DIRECTIVES:  HEALTH MAINTENANCE: History  Substance Use Topics  . Smoking status:  Not on file  . Smokeless tobacco: Not on file  . Alcohol Use: Not on file     Colonoscopy:  PAP:  Bone density:  Lipid panel:  Allergies no known allergies  Current Outpatient Prescriptions  Medication Sig Dispense Refill  . alendronate (FOSAMAX) 70 MG tablet TAKE 1 TABLET BY MOUTH EACH WEEK AS DIRECTED  12 tablet  1      OBJECTIVE: Elderly white woman who appears well Filed Vitals:   03/26/12 1202  BP: 132/88  Pulse: 99  Temp: 98.5 F (36.9 C)  Resp: 20     Body mass index is 19.86 kg/(m^2).    ECOG FS: 1  Sclerae unicteric Oropharynx clear No cervical or supraclavicular adenopathy Lungs no rales or rhonchi Heart regular rate and rhythm Abd benign MSK no focal spinal tenderness, no peripheral edema Neuro: nonfocal Breasts: The right breast is unremarkable. The left breast is status post lumpectomy, with no evidence of local recurrence. The left axilla is clear  LAB RESULTS: Lab Results  Component Value Date   WBC 8.1 03/26/2012   NEUTROABS 5.9 03/26/2012   HGB 14.2 03/26/2012   HCT 43.0 03/26/2012   MCV 99.0 03/26/2012   PLT 222 03/26/2012      Chemistry      Component Value Date/Time   NA 139 06/06/2011 1403   NA 139 06/06/2011 1403   NA 139 06/06/2011 1403   NA 139 06/06/2011 1403   K 3.9 06/06/2011 1403   K 3.9 06/06/2011 1403   K 3.9 06/06/2011 1403   K 3.9 06/06/2011 1403   CL 102 06/06/2011 1403   CL 102 06/06/2011 1403   CL 102 06/06/2011 1403   CL 102 06/06/2011 1403   CO2 28 06/06/2011 1403   CO2 28 06/06/2011 1403   CO2 28 06/06/2011 1403   CO2 28 06/06/2011 1403   BUN 20 06/06/2011 1403   BUN 20 06/06/2011 1403   BUN 20 06/06/2011 1403   BUN 20 06/06/2011 1403   CREATININE 1.26* 06/06/2011 1403   CREATININE 1.26* 06/06/2011 1403   CREATININE 1.26* 06/06/2011 1403   CREATININE 1.26* 06/06/2011 1403      Component Value Date/Time   CALCIUM 9.1 06/06/2011 1403   CALCIUM 9.1 06/06/2011 1403   CALCIUM 9.1 06/06/2011 1403   CALCIUM 9.1 06/06/2011 1403   ALKPHOS 35* 06/06/2011 1403   ALKPHOS 35* 06/06/2011 1403   ALKPHOS 35* 06/06/2011 1403   ALKPHOS 35* 06/06/2011 1403   AST 19 06/06/2011 1403   AST 19 06/06/2011 1403   AST 19 06/06/2011 1403   AST 19 06/06/2011 1403   ALT 12 06/06/2011 1403   ALT 12 06/06/2011 1403   ALT 12 06/06/2011 1403   ALT 12  06/06/2011 1403   BILITOT 0.3 06/06/2011 1403   BILITOT 0.3 06/06/2011 1403   BILITOT 0.3 06/06/2011 1403   BILITOT 0.3 06/06/2011 1403       Lab Results  Component Value Date   LABCA2 12 06/06/2011   LABCA2 12 06/06/2011   LABCA2 12 06/06/2011    No components found with this basename: [id number]    No results found for this basename: INR:1;PROTIME:1 in the last 168 hours  Urinalysis    Component Value Date/Time   COLORURINE YELLOW 05/07/2007 1145   APPEARANCEUR CLOUDY* 05/07/2007 1145   LABSPEC 1.019 05/07/2007 1145   PHURINE 5.5 05/07/2007 1145   GLUCOSEU NEGATIVE 05/07/2007 1145   HGBUR MODERATE* 05/07/2007 1145   BILIRUBINUR NEGATIVE 05/07/2007 1145  KETONESUR NEGATIVE 05/07/2007 1145   PROTEINUR >300* 05/07/2007 1145   UROBILINOGEN 0.2 05/07/2007 1145   NITRITE NEGATIVE 05/07/2007 1145   LEUKOCYTESUR LARGE* 05/07/2007 1145    STUDIES: No results found. Mammogram is being rescheduled for later this week  ASSESSMENT: 78 y.o. [place] woman status post left lumpectomy and sentinel lymph node biopsy May 2008 for a T1c N0 grade 2 invasive lobular carcinoma, which was ER positive, but PR and HER2 negative, with an MIB-1 of 17%. She received no radiation and has been on tamoxifen since July 2008 with good tolerance. She is status post [facility] July 2007 for CIN-3 of the cervix, the final pathology showing no residual disease and with a 0 of 14 lymph nodes involved.  PLAN: We discussed stopping tamoxifen at this point, switching to an aromatase inhibitor, on a continuing tamoxifen for another 5 years. We discussed discontinuing followup as well. Which she would like to do is continue the tamoxifen, which she is tolerating without any side effects that she is aware of, and continued to be seen here on a once a year basis. This is very reasonable as recent data shows this will reduce her risk of recurrence an additional 2-3%.  Accordingly I wrote her tamoxifen prescription and  made her return appointment here for a year from now. She was unable to get her mammogram before this visit because of her move and we have rescheduled that test for her. She knows to call for any problems that may develop before the next visit.   Jaymien Landin C    03/26/2012

## 2012-03-26 NOTE — Telephone Encounter (Signed)
MADE PATIENT APPOINTMENT FOR MAMMOGRAM AT [facility]  MADE PATIENT APPOINTMENT FOR RETURN OFFICE VISIT

## 2012-09-03 ENCOUNTER — Other Ambulatory Visit: Payer: Self-pay | Admitting: Oncology

## 2013-03-31 ENCOUNTER — Other Ambulatory Visit: Payer: Self-pay | Admitting: Physician Assistant

## 2013-03-31 DIAGNOSIS — C50919 Malignant neoplasm of unspecified site of unspecified female breast: Secondary | ICD-10-CM

## 2013-04-01 ENCOUNTER — Other Ambulatory Visit (HOSPITAL_BASED_OUTPATIENT_CLINIC_OR_DEPARTMENT_OTHER): Payer: Medicare Other | Admitting: Lab

## 2013-04-01 ENCOUNTER — Ambulatory Visit (HOSPITAL_BASED_OUTPATIENT_CLINIC_OR_DEPARTMENT_OTHER): Payer: Medicare Other | Admitting: Physician Assistant

## 2013-04-01 ENCOUNTER — Telehealth: Payer: Self-pay | Admitting: Oncology

## 2013-04-01 ENCOUNTER — Encounter: Payer: Self-pay | Admitting: Physician Assistant

## 2013-04-01 VITALS — BP 158/92 | HR 98 | Temp 97.1°F | Resp 18 | Ht 61.0 in | Wt 106.7 lb

## 2013-04-01 DIAGNOSIS — C50019 Malignant neoplasm of nipple and areola, unspecified female breast: Secondary | ICD-10-CM

## 2013-04-01 DIAGNOSIS — C50919 Malignant neoplasm of unspecified site of unspecified female breast: Secondary | ICD-10-CM

## 2013-04-01 DIAGNOSIS — Z170 Estrogen receptor positive status [ER+]: Secondary | ICD-10-CM

## 2013-04-01 DIAGNOSIS — H9190 Unspecified hearing loss, unspecified ear: Secondary | ICD-10-CM

## 2013-04-01 DIAGNOSIS — M810 Age-related osteoporosis without current pathological fracture: Secondary | ICD-10-CM

## 2013-04-01 DIAGNOSIS — E785 Hyperlipidemia, unspecified: Secondary | ICD-10-CM

## 2013-04-01 DIAGNOSIS — C50912 Malignant neoplasm of unspecified site of left female breast: Secondary | ICD-10-CM

## 2013-04-01 HISTORY — DX: Unspecified hearing loss, unspecified ear: H91.90

## 2013-04-01 HISTORY — DX: Age-related osteoporosis without current pathological fracture: M81.0

## 2013-04-01 HISTORY — DX: Hyperlipidemia, unspecified: E78.5

## 2013-04-01 LAB — COMPREHENSIVE METABOLIC PANEL (CC13)
Albumin: 3.2 g/dL — ABNORMAL LOW (ref 3.5–5.0)
Anion Gap: 8 mEq/L (ref 3–11)
CO2: 28 mEq/L (ref 22–29)
Calcium: 9.1 mg/dL (ref 8.4–10.4)
Chloride: 108 mEq/L (ref 98–109)
Glucose: 104 mg/dl (ref 70–140)
Potassium: 4 mEq/L (ref 3.5–5.1)
Sodium: 143 mEq/L (ref 136–145)
Total Protein: 6.8 g/dL (ref 6.4–8.3)

## 2013-04-01 LAB — CBC WITH DIFFERENTIAL/PLATELET
Eosinophils Absolute: 0.1 10*3/uL (ref 0.0–0.5)
HGB: 14.4 g/dL (ref 11.6–15.9)
MONO#: 0.5 10*3/uL (ref 0.1–0.9)
NEUT#: 8.1 10*3/uL — ABNORMAL HIGH (ref 1.5–6.5)
RBC: 4.41 10*6/uL (ref 3.70–5.45)
RDW: 13.3 % (ref 11.2–14.5)
WBC: 10.6 10*3/uL — ABNORMAL HIGH (ref 3.9–10.3)

## 2013-04-01 MED ORDER — TAMOXIFEN CITRATE 20 MG PO TABS: 20.0000 mg | ORAL_TABLET | Freq: Every day | ORAL | Status: DC

## 2013-04-01 NOTE — Telephone Encounter (Signed)
, °

## 2013-04-01 NOTE — Progress Notes (Signed)
ID: Ruth [name]   DOB: August 29, 1932  MR#: [id number]  CSN#:[id number]  PCP: Carolyne Fiscal, MD GYN:  SU: Harriette Bouillon MD OTHER MD: Marcine Matar, MD   CHIEF COMPLAINT:  Left Breast Cancer   HISTORY OF PRESENT ILLNESS: The patient had a screening mammogram at [facility] on 10-17-06 which showed some asymmetry in the left breast.  Additional views were obtained on 10-25-06 and showed approximately a 10 mm. nodular density in the retroareolar region of the left breast.  Ultrasound was obtained and showed it to be a hypoechoic mass measuring 10.8 mm.  Biopsy was obtained the same day and showed ([id number]) invasive lobular breast cancer which was 97% estrogen receptor positive, 0% progesterone receptor with a proliferation marker of 17% and HER-2/neu negative at 0.    With this information, the patient was referred to Dr. Luisa Hart and on 10-31-06 bilateral breast MRI's were obtained at [facility].  This showed a bilobed 8 mm. spiculated nodule in the left breast with no evidence of axillary involvement and nothing of importance in the right breast.    With this information, after appropriate discussion, the patient proceeded to left lumpectomy and sentinel lymph node biopsy under Dr. Luisa Hart on 11-23-06.  The final pathology ([id number]) confirmed an invasive lobular carcinoma measuring 1.5 cm., grade 2, with no evidence of lymphovascular invasion, adequate margins and 0 of 3 sentinel lymph nodes involved. Her subsequent history is as detailed below.  INTERVAL HISTORY: Ruth [name] returns alone today for followup of her left breast cancer. Her husband Channing Mutters was unable to come with her today. He's had multiple health problems over the past year, including an aortic aneurysm for which he had surgery approximately 6 months ago. She tells me he is "doing okay".   Ruth [name] continues on tamoxifen which she is tolerating well. She's had no problems with hot flashes. She has a  slight vaginal discharge, clear and non-odorous. She's had no abnormal vaginal bleeding. She denies any abnormal clotting. She has regular eye exams and has had no significant change in vision. Interval history is notable for Ruth [name] currently being treated with Cipro for urinary tract infection which is improving.  REVIEW OF SYSTEMS: Ruth [name] denies any fevers or chills. She's had no rashes or skin changes. Her energy level is fair. She is sleeping well at night. She feels a little forgetful at times. She's eating and drinking well had no nausea or change in bowel or bladder habits. She denies any cough, increased shortness of breath, chest pain, or palpitations. She's had no abnormal headaches or dizziness. She denies any pain, specifically no unusual myalgias, arthralgias, or bony pain. She's had no peripheral swelling.  A detailed review of systems is otherwise stable and noncontributory.   PAST MEDICAL HISTORY: Past Medical History  Diagnosis Date  . Osteoporosis, unspecified 04/01/2013  . Hyperlipidemia 04/01/2013  . Hearing loss 04/01/2013  Significant for the patient being status post hysterectomy and bilateral salpingo-oophorectomy under Dr. Richardean Chimera and Dr. De Blanch on 01-09-06.  The final pathology of that surgery ([id number]) showed no residual disease and a total of 14 lymph nodes were negative for carcinoma.  On 11-16-05 the patient had endocervical curettage showing a high grade squamous intraepithelial lesion, CIN-3. The patient is followed by Dr. Arelia Sneddon for this problem. She is also status post cholecystectomy.  She has a history of hypercholesterolemia, osteoporosis, bilateral hearing loss, question of hypertension and ongoing tobacco abuse.  She has an ascending thoracic aortic aneurysm which measured  3.8 cm. by the recent breast MRI and she has problems with bladder atony and stress incontinence.  PAST SURGICAL HISTORY: History reviewed. No pertinent past surgical  history.  FAMILY HISTORY History reviewed. No pertinent family history. The patient's father died at the age of 70 from a myocardial infarction.  The patient's mother died at the age of 61 from a stroke.  She has two sisters and one brother.  No history of breast or ovarian cancer in the family.    GYNECOLOGIC HISTORY: She is GX P2, first pregnancy age 46, change of life age 43.  She did not take hormone replacement.  SOCIAL HISTORY: (Updated 04/01/2013) She used to work in Clinical biochemist.  She has been married 47+ years to Yeager.  He is a semi-retired Comptroller.  Their daughter, Darl Pikes, is a Herbalist.  She works for a [employer].  Their son, Onalee Hua, works in Arts administrator and lives in [place].  The patient has one grandchild.  She is not a Advice worker.    ADVANCED DIRECTIVES:  HEALTH MAINTENANCE:  (Updated 04/01/2013) History  Substance Use Topics  . Smoking status: Current Every Day Smoker -- 1.50 packs/day    Types: Cigarettes  . Smokeless tobacco: Never Used  . Alcohol Use: Yes     Comment: occas     Colonoscopy:  PAP:  S/p TAH/BSO 2007  Bone density: June 2014, osteoporosis  Lipid panel: UTD, Dr. Duaine Dredge   No Known Allergies  Current Outpatient Prescriptions  Medication Sig Dispense Refill  . alendronate (FOSAMAX) 70 MG tablet TAKE 1 TABLET BY MOUTH EACH WEEK AS DIRECTED  12 tablet  1  . aspirin 81 MG tablet Take 81 mg by mouth daily.      . calcium carbonate (TUMS EX) 750 MG chewable tablet Chew 2 tablets by mouth daily.      . fish oil-omega-3 fatty acids 1000 MG capsule Take 1 g by mouth 2 (two) times daily.      . Multiple Vitamin (MULTIVITAMIN) tablet Take 1 tablet by mouth daily.      . pravastatin (PRAVACHOL) 10 MG tablet Take 10 mg by mouth daily.      . tamoxifen (NOLVADEX) 20 MG tablet Take 1 tablet (20 mg total) by mouth daily.  30 tablet  11  . tiotropium (SPIRIVA) 18 MCG inhalation capsule Place 18 mcg into inhaler and  inhale daily.       No current facility-administered medications for this visit.    OBJECTIVE: Elderly white woman who appears well and is in no acute distress Filed Vitals:   04/01/13 1133  BP: 158/92  Pulse: 98  Temp: 97.1 F (36.2 C)  Resp: 18     Body mass index is 20.17 kg/(m^2).    ECOG FS: 1 Filed Weights   04/01/13 1133  Weight: 106 lb 11.2 oz (48.399 kg)   Physical Exam: HEENT:  Sclerae anicteric.  Oropharynx clear. Buccal mucosa is pink and moist NODES:  No cervical or supraclavicular lymphadenopathy palpated.  BREAST EXAM:  Right breast is unremarkable. Left breast is status post lumpectomy, with no suspicious nodularity or evidence of local recurrence. Axillae are benign bilaterally with no palpable adenopathy. LUNGS:  Clear to auscultation bilaterally.  No wheezes or rhonchi HEART:  Regular rate and rhythm. No murmur appreciated ABDOMEN:  Soft, thin, nontender.  Positive bowel sounds.  MSK:  No focal spinal tenderness to palpation. Full range of motion in the upper tremor these. EXTREMITIES:  No peripheral edema.  NEURO:  Nonfocal. Well oriented.  Positive affect.    LAB RESULTS: Lab Results  Component Value Date   WBC 10.6* 04/01/2013   NEUTROABS 8.1* 04/01/2013   HGB 14.4 04/01/2013   HCT 42.7 04/01/2013   MCV 96.8 04/01/2013   PLT 209 04/01/2013      Chemistry      Component Value Date/Time   NA 143 04/01/2013 1106   NA 139 06/06/2011 1403   K 4.0 04/01/2013 1106   K 3.9 06/06/2011 1403   CL 105 03/26/2012 1145   CL 102 06/06/2011 1403   CO2 28 04/01/2013 1106   CO2 28 06/06/2011 1403   BUN 15.4 04/01/2013 1106   BUN 20 06/06/2011 1403   CREATININE 1.2* 04/01/2013 1106   CREATININE 1.26* 06/06/2011 1403      Component Value Date/Time   CALCIUM 9.1 04/01/2013 1106   CALCIUM 9.1 06/06/2011 1403   ALKPHOS 43 04/01/2013 1106   ALKPHOS 35* 06/06/2011 1403   AST 17 04/01/2013 1106   AST 19 06/06/2011 1403   ALT 12 04/01/2013 1106   ALT 12 06/06/2011 1403    BILITOT 0.41 04/01/2013 1106   BILITOT 0.3 06/06/2011 1403       STUDIES:  Most recent mammogram at [facility] on 04/03/2012 was unremarkable. This is scheduled to be repeated later this week on 04/04/2013.  Bone density at [facility] on 12/02/2012 confirms osteoporosis. Patient continues on Fosamax weekly.    ASSESSMENT: 79 y.o. [place] woman   (1)  status post left lumpectomy and sentinel lymph node biopsy May 2008 for a T1c N0 grade 2 invasive lobular carcinoma, which was ER positive, but PR and HER2 negative, with an MIB-1 of 17%.   (2)  She received no radiation and has been on tamoxifen since July 2008 with good tolerance. The plan is to continue tamoxifen for total of 10 years.  (3)  She is status post [facility] July 2007 for CIN-3 of the cervix, the final pathology showing no residual disease and with a 0 of 14 lymph nodes involved.   PLAN:  With regards to her breast cancer, Ruth [name] seems to be doing well, with no clinical evidence of disease recurrence. She will continue on tamoxifen, the plan being to complete a total of 10 years (until July 2018). This has been refilled for another year.  She scheduled for her mammogram tomorrow. We'll plan on seeing her again in one year from now for labs and physical exam, and we'll try to schedule her followup appointment here in late October, after her mammogram in 2015.   This was all reviewed with the patient detailed today and she voices understanding and agreement with our plan. She will call any changes prior to her next appointment.   Ruth [name]    04/01/2013

## 2013-08-18 ENCOUNTER — Other Ambulatory Visit: Payer: Self-pay | Admitting: *Deleted

## 2013-08-18 DIAGNOSIS — C50912 Malignant neoplasm of unspecified site of left female breast: Secondary | ICD-10-CM

## 2013-08-18 MED ORDER — TAMOXIFEN CITRATE 20 MG PO TABS: 20.0000 mg | ORAL_TABLET | Freq: Every day | ORAL | Status: DC

## 2013-11-12 ENCOUNTER — Other Ambulatory Visit: Payer: Self-pay | Admitting: Family Medicine

## 2013-11-12 DIAGNOSIS — I719 Aortic aneurysm of unspecified site, without rupture: Secondary | ICD-10-CM

## 2013-11-13 ENCOUNTER — Other Ambulatory Visit: Payer: Medicare Other

## 2013-11-14 ENCOUNTER — Inpatient Hospital Stay (HOSPITAL_COMMUNITY)
Admission: AD | Admit: 2013-11-14 | Discharge: 2013-11-19 | DRG: 371 | Disposition: A | Discharge status: Home Health | Payer: Medicare Other | Source: Ambulatory Visit | Attending: Internal Medicine | Admitting: Internal Medicine

## 2013-11-14 ENCOUNTER — Other Ambulatory Visit: Payer: Medicare Other

## 2013-11-14 ENCOUNTER — Telehealth: Payer: Self-pay | Admitting: Family Medicine

## 2013-11-14 DIAGNOSIS — Z681 Body mass index (BMI) 19 or less, adult: Secondary | ICD-10-CM

## 2013-11-14 DIAGNOSIS — Z7982 Long term (current) use of aspirin: Secondary | ICD-10-CM

## 2013-11-14 DIAGNOSIS — A0472 Enterocolitis due to Clostridium difficile, not specified as recurrent: Principal | ICD-10-CM | POA: Diagnosis present

## 2013-11-14 DIAGNOSIS — Z2989 Encounter for other specified prophylactic measures: Secondary | ICD-10-CM

## 2013-11-14 DIAGNOSIS — N179 Acute kidney failure, unspecified: Secondary | ICD-10-CM

## 2013-11-14 DIAGNOSIS — R531 Weakness: Secondary | ICD-10-CM | POA: Diagnosis present

## 2013-11-14 DIAGNOSIS — E860 Dehydration: Secondary | ICD-10-CM | POA: Diagnosis present

## 2013-11-14 DIAGNOSIS — Z79899 Other long term (current) drug therapy: Secondary | ICD-10-CM

## 2013-11-14 DIAGNOSIS — Z66 Do not resuscitate: Secondary | ICD-10-CM | POA: Diagnosis present

## 2013-11-14 DIAGNOSIS — A498 Other bacterial infections of unspecified site: Secondary | ICD-10-CM | POA: Diagnosis present

## 2013-11-14 DIAGNOSIS — A0471 Enterocolitis due to Clostridium difficile, recurrent: Secondary | ICD-10-CM | POA: Diagnosis present

## 2013-11-14 DIAGNOSIS — R5383 Other fatigue: Secondary | ICD-10-CM

## 2013-11-14 DIAGNOSIS — K529 Noninfective gastroenteritis and colitis, unspecified: Secondary | ICD-10-CM | POA: Diagnosis present

## 2013-11-14 DIAGNOSIS — Z418 Encounter for other procedures for purposes other than remedying health state: Secondary | ICD-10-CM

## 2013-11-14 DIAGNOSIS — C50919 Malignant neoplasm of unspecified site of unspecified female breast: Secondary | ICD-10-CM

## 2013-11-14 DIAGNOSIS — E785 Hyperlipidemia, unspecified: Secondary | ICD-10-CM

## 2013-11-14 DIAGNOSIS — Z853 Personal history of malignant neoplasm of breast: Secondary | ICD-10-CM

## 2013-11-14 DIAGNOSIS — H9190 Unspecified hearing loss, unspecified ear: Secondary | ICD-10-CM

## 2013-11-14 DIAGNOSIS — R5381 Other malaise: Secondary | ICD-10-CM | POA: Diagnosis present

## 2013-11-14 DIAGNOSIS — E41 Nutritional marasmus: Secondary | ICD-10-CM | POA: Diagnosis present

## 2013-11-14 DIAGNOSIS — M810 Age-related osteoporosis without current pathological fracture: Secondary | ICD-10-CM

## 2013-11-14 DIAGNOSIS — D72829 Elevated white blood cell count, unspecified: Secondary | ICD-10-CM | POA: Diagnosis present

## 2013-11-14 DIAGNOSIS — F17200 Nicotine dependence, unspecified, uncomplicated: Secondary | ICD-10-CM | POA: Diagnosis present

## 2013-11-15 ENCOUNTER — Encounter (HOSPITAL_COMMUNITY): Payer: Self-pay | Admitting: *Deleted

## 2013-11-15 DIAGNOSIS — A0471 Enterocolitis due to Clostridium difficile, recurrent: Secondary | ICD-10-CM | POA: Diagnosis present

## 2013-11-15 DIAGNOSIS — R5383 Other fatigue: Secondary | ICD-10-CM

## 2013-11-15 DIAGNOSIS — A0472 Enterocolitis due to Clostridium difficile, not specified as recurrent: Principal | ICD-10-CM

## 2013-11-15 DIAGNOSIS — E860 Dehydration: Secondary | ICD-10-CM | POA: Diagnosis present

## 2013-11-15 DIAGNOSIS — A498 Other bacterial infections of unspecified site: Secondary | ICD-10-CM

## 2013-11-15 DIAGNOSIS — R5381 Other malaise: Secondary | ICD-10-CM

## 2013-11-15 DIAGNOSIS — R531 Weakness: Secondary | ICD-10-CM | POA: Diagnosis present

## 2013-11-15 DIAGNOSIS — K529 Noninfective gastroenteritis and colitis, unspecified: Secondary | ICD-10-CM | POA: Diagnosis present

## 2013-11-15 DIAGNOSIS — E785 Hyperlipidemia, unspecified: Secondary | ICD-10-CM

## 2013-11-15 LAB — CBC WITH DIFFERENTIAL/PLATELET
BASOS PCT: 0 % (ref 0–1)
Basophils Absolute: 0 10*3/uL (ref 0.0–0.1)
EOS PCT: 0 % (ref 0–5)
Eosinophils Absolute: 0 10*3/uL (ref 0.0–0.7)
HCT: 44.1 % (ref 36.0–46.0)
Hemoglobin: 15.4 g/dL — ABNORMAL HIGH (ref 12.0–15.0)
Lymphocytes Relative: 7 % — ABNORMAL LOW (ref 12–46)
Lymphs Abs: 1.4 10*3/uL (ref 0.7–4.0)
MCH: 32.4 pg (ref 26.0–34.0)
MCHC: 34.9 g/dL (ref 30.0–36.0)
MCV: 92.6 fL (ref 78.0–100.0)
MONOS PCT: 8 % (ref 3–12)
Monocytes Absolute: 1.6 10*3/uL — ABNORMAL HIGH (ref 0.1–1.0)
Neutro Abs: 17.2 10*3/uL — ABNORMAL HIGH (ref 1.7–7.7)
Neutrophils Relative %: 85 % — ABNORMAL HIGH (ref 43–77)
Platelets: 240 10*3/uL (ref 150–400)
RBC: 4.76 MIL/uL (ref 3.87–5.11)
RDW: 14.8 % (ref 11.5–15.5)
SMEAR REVIEW: ADEQUATE
WBC MORPHOLOGY: INCREASED
WBC: 20.2 10*3/uL — AB (ref 4.0–10.5)

## 2013-11-15 LAB — COMPREHENSIVE METABOLIC PANEL
ALBUMIN: 2.7 g/dL — AB (ref 3.5–5.2)
ALT: 64 U/L — AB (ref 0–35)
AST: 67 U/L — ABNORMAL HIGH (ref 0–37)
Alkaline Phosphatase: 56 U/L (ref 39–117)
BILIRUBIN TOTAL: 0.4 mg/dL (ref 0.3–1.2)
BUN: 28 mg/dL — ABNORMAL HIGH (ref 6–23)
CO2: 23 meq/L (ref 19–32)
Calcium: 8.3 mg/dL — ABNORMAL LOW (ref 8.4–10.5)
Chloride: 97 mEq/L (ref 96–112)
Creatinine, Ser: 1.61 mg/dL — ABNORMAL HIGH (ref 0.50–1.10)
GFR calc Af Amer: 34 mL/min — ABNORMAL LOW (ref >90)
GFR, EST NON AFRICAN AMERICAN: 30 mL/min — AB (ref >90)
Glucose, Bld: 122 mg/dL — ABNORMAL HIGH (ref 70–99)
POTASSIUM: 3.7 meq/L (ref 3.7–5.3)
SODIUM: 137 meq/L (ref 137–147)
Total Protein: 6.4 g/dL (ref 6.0–8.3)

## 2013-11-15 LAB — FECAL LACTOFERRIN, QUANT: Fecal Lactoferrin: POSITIVE

## 2013-11-15 LAB — CLOSTRIDIUM DIFFICILE BY PCR: Toxigenic C. Difficile by PCR: POSITIVE — AB

## 2013-11-15 MED ORDER — CALCIUM CARBONATE ANTACID 500 MG PO CHEW
3.0000 | CHEWABLE_TABLET | Freq: Every day | ORAL | Status: DC
  Administered 2013-11-16 – 2013-11-18 (×2): 600 mg via ORAL
  Filled 2013-11-15 (×5): qty 3

## 2013-11-15 MED ORDER — ASPIRIN EC 81 MG PO TBEC
81.0000 mg | DELAYED_RELEASE_TABLET | Freq: Every day | ORAL | Status: DC
  Administered 2013-11-15 – 2013-11-19 (×5): 81 mg via ORAL
  Filled 2013-11-15 (×5): qty 1

## 2013-11-15 MED ORDER — SODIUM CHLORIDE 0.9 % IV SOLN
INTRAVENOUS | Status: AC
  Administered 2013-11-15: 06:00:00 via INTRAVENOUS

## 2013-11-15 MED ORDER — TIOTROPIUM BROMIDE MONOHYDRATE 18 MCG IN CAPS
18.0000 ug | ORAL_CAPSULE | Freq: Every day | RESPIRATORY_TRACT | Status: DC
  Administered 2013-11-15 – 2013-11-19 (×5): 18 ug via RESPIRATORY_TRACT
  Filled 2013-11-15: qty 5

## 2013-11-15 MED ORDER — OMEGA-3-ACID ETHYL ESTERS 1 G PO CAPS
1.0000 g | ORAL_CAPSULE | Freq: Two times a day (BID) | ORAL | Status: DC
  Administered 2013-11-15 – 2013-11-19 (×8): 1 g via ORAL
  Filled 2013-11-15 (×10): qty 1

## 2013-11-15 MED ORDER — SIMVASTATIN 5 MG PO TABS
5.0000 mg | ORAL_TABLET | Freq: Every day | ORAL | Status: DC
  Administered 2013-11-16 – 2013-11-18 (×3): 5 mg via ORAL
  Filled 2013-11-15 (×5): qty 1

## 2013-11-15 MED ORDER — ONE-DAILY MULTI VITAMINS PO TABS: 1.0000 | ORAL_TABLET | Freq: Every day | ORAL | Status: DC

## 2013-11-15 MED ORDER — SACCHAROMYCES BOULARDII 250 MG PO CAPS
250.0000 mg | ORAL_CAPSULE | Freq: Two times a day (BID) | ORAL | Status: DC
  Administered 2013-11-15 – 2013-11-19 (×9): 250 mg via ORAL
  Filled 2013-11-15 (×10): qty 1

## 2013-11-15 MED ORDER — ONDANSETRON HCL 4 MG PO TABS: 4.0000 mg | ORAL_TABLET | Freq: Four times a day (QID) | ORAL | Status: DC | PRN

## 2013-11-15 MED ORDER — OMEGA-3 FATTY ACIDS 1000 MG PO CAPS: 1.0000 g | ORAL_CAPSULE | Freq: Two times a day (BID) | ORAL | Status: DC

## 2013-11-15 MED ORDER — ADULT MULTIVITAMIN W/MINERALS CH
1.0000 | ORAL_TABLET | Freq: Every day | ORAL | Status: DC
Start: 2013-11-15 — End: 2013-11-19
  Administered 2013-11-15 – 2013-11-19 (×5): 1 via ORAL
  Filled 2013-11-15 (×5): qty 1

## 2013-11-15 MED ORDER — SODIUM CHLORIDE 0.9 % IV BOLUS (SEPSIS)
1000.0000 mL | Freq: Once | INTRAVENOUS | Status: AC
  Administered 2013-11-15: 1000 mL via INTRAVENOUS

## 2013-11-15 MED ORDER — BOOST / RESOURCE BREEZE PO LIQD
1.0000 | Freq: Two times a day (BID) | ORAL | Status: DC
  Administered 2013-11-15 – 2013-11-18 (×5): 1 via ORAL

## 2013-11-15 MED ORDER — CALCIUM CARBONATE ANTACID 750 MG PO CHEW: 2.0000 | CHEWABLE_TABLET | Freq: Every day | ORAL | Status: DC

## 2013-11-15 MED ORDER — TAMOXIFEN CITRATE 10 MG PO TABS
20.0000 mg | ORAL_TABLET | Freq: Every day | ORAL | Status: DC
  Administered 2013-11-15 – 2013-11-19 (×5): 20 mg via ORAL
  Filled 2013-11-15 (×5): qty 2

## 2013-11-15 MED ORDER — ASPIRIN 81 MG PO TABS: 81.0000 mg | ORAL_TABLET | Freq: Every day | ORAL | Status: DC

## 2013-11-15 MED ORDER — ONDANSETRON HCL 4 MG/2ML IJ SOLN
4.0000 mg | Freq: Four times a day (QID) | INTRAMUSCULAR | Status: DC | PRN
  Administered 2013-11-15: 4 mg via INTRAVENOUS
  Filled 2013-11-15: qty 2

## 2013-11-15 MED ORDER — VANCOMYCIN 50 MG/ML ORAL SOLUTION
125.0000 mg | Freq: Four times a day (QID) | ORAL | Status: DC
  Administered 2013-11-15 – 2013-11-19 (×19): 125 mg via ORAL
  Filled 2013-11-15 (×25): qty 2.5

## 2013-11-15 MED ORDER — BOOST / RESOURCE BREEZE PO LIQD: 1.0000 | Freq: Every day | ORAL | Status: DC

## 2013-11-15 NOTE — Progress Notes (Signed)
Ruth [name] [id number] Admitted to 0F00: 11/15/2013 12:35 AM Attending Provider: Phillips Grout, MD    Ruth [name] is a 80 y.o. female patient admitted from ED awake, alert  & orientated  X 3,  No Order, no c/o shortness of breath, no c/o chest pain, no distress noted.   Allergies:  No Known Allergies   Past Medical History  Diagnosis Date  . Osteoporosis, unspecified 04/01/2013  . Hyperlipidemia 04/01/2013  . Hearing loss 04/01/2013    History:  obtained from patient/husband  Pt orientation to unit, room and routine. Information packet given to patient/family.  Admission INP armband ID verified with patient/family, and in place. SR up x 2, fall risk assessment complete with Patient and family verbalizing understanding of risks associated with falls. Pt verbalizes an understanding of how to use the call bell and to call for help before getting out of bed.  Skin, clean-dry- intact without evidence of bruising, or skin tears.   No evidence of skin break down noted on exam.  Will cont to monitor and assist as needed.  Josephine Cables [place], RN 11/15/2013 12:35 AM

## 2013-11-15 NOTE — H&P (Signed)
PCP:   Ruth Land, MD   Chief Complaint:  Diarrhea , feeling weak  HPI: 80 yo female direct admit from [name] pcp earlier this evening for progressive worsening weakness and abnormal stool labs obtained 2 days ago in his office pos cdiff and shiga producing ecoli.  lives at assisted living with husband.  flagyl given couple of weeks ago for cdiff finished may 11 and improved. then diarrhea returned again not as severe as last month this past week. not eating well. wbc 3500 2 days ago. very weak.  She denies any n/v.  Denies any fevers or abd pain.  No sick contacts.  Does not frequent nursing homes.  She says she completed the course of flagyl ten days total and it really messed up her stomach, her appetite has not returned to normal since taking that flagyl.  [name] has faxed all of her recent labs and notes to the [facility].   Records reviewed from dr blomgren office:  Notes from visit Nov 12, 2013 vitals nml at that time.  Stool cx from 4/27 pos cdiff.  4/27 cr 1.08 wbc 12, hgb 14.4 sed rate 1, lip and amylase nml.  pcp did not have report from recent stool studies available to fax   Review of Systems:  Positive and negative as per HPI otherwise all other systems are negative  Past Medical History: Past Medical History  Diagnosis Date  . Osteoporosis, unspecified 04/01/2013  . Hyperlipidemia 04/01/2013  . Hearing loss 04/01/2013   History reviewed. No pertinent past surgical history.  Medications: Prior to Admission medications   Medication Sig Start Date End Date Taking? Authorizing Provider  alendronate (FOSAMAX) 70 MG tablet TAKE 1 TABLET BY MOUTH EACH WEEK AS DIRECTED 09/03/12   Chauncey Cruel, MD  aspirin 81 MG tablet Take 81 mg by mouth daily.    Historical Provider, MD  calcium carbonate (TUMS EX) 750 MG chewable tablet Chew 2 tablets by mouth daily.    Historical Provider, MD  fish oil-omega-3 fatty acids 1000 MG capsule Take 1 g by mouth 2  (two) times daily.    Historical Provider, MD  Multiple Vitamin (MULTIVITAMIN) tablet Take 1 tablet by mouth daily.    Historical Provider, MD  pravastatin (PRAVACHOL) 10 MG tablet Take 10 mg by mouth daily.    Historical Provider, MD  tamoxifen (NOLVADEX) 20 MG tablet Take 1 tablet (20 mg total) by mouth daily. 08/18/13   Amy Milda Smart, PA-C  tiotropium (SPIRIVA) 18 MCG inhalation capsule Place 18 mcg into inhaler and inhale daily.    Historical Provider, MD    Allergies:  No Known Allergies  Social History:  reports that she has been smoking Cigarettes.  She has a 91.5 pack-year smoking history. She has never used smokeless tobacco. She reports that she drinks alcohol. She reports that she does not use illicit drugs.  Family History: History reviewed. No pertinent family history.  Physical Exam: There were no vitals filed for this visit. General appearance: alert, cooperative and no distress Head: Normocephalic, without obvious abnormality, atraumatic Eyes: negative Nose: Nares normal. Septum midline. Mucosa normal. No drainage or sinus tenderness. Neck: no JVD and supple, symmetrical, trachea midline Lungs: clear to auscultation bilaterally Heart: regular rate and rhythm, S1, S2 normal, no murmur, click, rub or gallop Abdomen: soft, non-tender; bowel sounds normal; no masses,  no organomegaly Extremities: extremities normal, atraumatic, no cyanosis or edema Pulses: 2+ and symmetric Skin: Skin color, texture, turgor  normal. No rashes or lesions Neurologic: Grossly normal    Labs on Admission:   pending    Radiological Exams on Admission: No results found.  Assessment/Plan  80 yo female with possible recurrent cdiff and ?shiga producing e coli  Principal Problem:   Possible Recurrent Clostridium difficile diarrhea-  Will need to get those stool cultures from pcp in next day or so, will resend tonight.  Place on vancomycin for now.  Labs are pending.    Active Problems:   Stable unless o/w noted   Dehydration  Give ivf bolus, then maintenance overnight.   Weakness generalized   Shiga toxin-producing Escherichia coli (E. coli) (STEC), unspecified  Full code.  Contact isolation.  Nachum Derossett A Shanon Brow 11/15/2013, 12:21 AM

## 2013-11-15 NOTE — Progress Notes (Addendum)
INITIAL NUTRITION ASSESSMENT  DOCUMENTATION CODES Per approved criteria  -Severe malnutrition in the context of chronic illness -Underweight   INTERVENTION: Resource Breeze po BID, each supplement provides 250 kcal and 9 grams of protein RD to follow for nutrition care plan  NUTRITION DIAGNOSIS: Inadequate oral intake related to poor appetite, C-diff as evidenced by husband report  Goal: Pt to meet >/= 90% of their estimated nutrition needs   Monitor:  PO & supplemental intake, weight, labs, I/O's  Reason for Assessment: Malnutrition Screening Tool Report  80 y.o. female  Admitting Dx: Recurrent Clostridium difficile diarrhea  ASSESSMENT: 80 y.o. Female with PMH of breast cancer; admitted from [name] for progressive worsening weakness and abnormal stool labs; Flagyl given couple of weeks ago for C diff; diarrhea returned again not as severe as last month this past week.   RD spoke with patient's husband who reports patient is eating "zilch;" states pt with very poor appetite for 2-3 days PTA; usually consumes at least 2 meals per day; husband feels patient has lost at least 8 lbs; per wt readings pt has had a 18% weight loss since October 2014 -- severe for time frame; husband amenable to RD ordering Resource Breeze nutrition supplement.  Nutrition Focused Physical Exam:  Subcutaneous Fat:  Orbital Region: N/A Upper Arm Region: severe depletion Thoracic and Lumbar Region: N/A  Muscle:  Temple Region:  Clavicle Bone Region: severe depletion Clavicle and Acromion Bone Region: severe depletion Scapular Bone Region: N/A Dorsal Hand: N/A Patellar Region: N/A Anterior Thigh Region: N/A Posterior Calf Region: N/A  Edema: none  Patient meets criteria for severe malnutrition in the context of chronic illness as evidenced by 18% weight loss in < 1 year, severe muscle loss and severe subcutaneous fat loss.  Height: Ht Readings from Last 1 Encounters:  11/15/13 5\' 1"   (1.549 m)    Weight: Wt Readings from Last 1 Encounters:  11/15/13 87 lb 15.4 oz (39.9 kg)    Ideal Body Weight: 105 lb  % Ideal Body Weight: 83%  Wt Readings from Last 10 Encounters:  11/15/13 87 lb 15.4 oz (39.9 kg)  04/01/13 106 lb 11.2 oz (48.399 kg)  03/26/12 105 lb 1.6 oz (47.673 kg)  06/13/11 113 lb 12.8 oz (51.619 kg)    Usual Body Weight: 106 lb  % Usual Body Weight: 82%  BMI:  Body mass index is 16.63 kg/(m^2).  Estimated Nutritional Needs: Kcal: 1100-1300 Protein: 55-65 gm Fluid: >/= 1.5 L  Skin: Intact  Diet Order: Cardiac  EDUCATION NEEDS: -No education needs identified at this time   Intake/Output Summary (Last 24 hours) at 11/15/13 1409 Last data filed at 11/15/13 0544  Gross per 24 hour  Intake    240 ml  Output      0 ml  Net    240 ml    Labs:   Recent Labs Lab 11/15/13 0150  NA 137  K 3.7  CL 97  CO2 23  BUN 28*  CREATININE 1.61*  CALCIUM 8.3*  GLUCOSE 122*    Scheduled Meds: . aspirin EC  81 mg Oral Daily  . calcium carbonate  3 tablet Oral Daily  . multivitamin with minerals  1 tablet Oral Daily  . omega-3 acid ethyl esters  1 g Oral BID  . saccharomyces boulardii  250 mg Oral BID  . simvastatin  5 mg Oral q1800  . tamoxifen  20 mg Oral Daily  . tiotropium  18 mcg Inhalation Daily  . vancomycin  125 mg Oral 4 times per day    Continuous Infusions:   Past Medical History  Diagnosis Date  . Osteoporosis, unspecified 04/01/2013  . Hyperlipidemia 04/01/2013  . Hearing loss 04/01/2013    History reviewed. No pertinent past surgical history.  Arthur Holms, RD, LDN Pager #: [phone] After-Hours Pager #: [phone]

## 2013-11-15 NOTE — Progress Notes (Addendum)
PATIENT DETAILS Name: Ruth [name] Age: 80 y.o. Sex: female Date of Birth: October 24, 1932 Admit Date: 11/14/2013 Admitting Physician Phillips Grout, MD UJW:[id number],NFAOZ F, MD  Subjective: Admitted with worsening diarrhea-only 2 BM's overnight. No other complaints.  Assessment/Plan: Principal Problem:   Suspected Recurrent Clostridium difficile diarrhea -recently finished treatment with Flagyl on 5/11 for first episode of C Diff, only to have recurrent diarrhea a few days back. Admitted for weakness and worsening diarrhea. Much better overnight, now on Oral Vancomycin-started 5/23. Await C Diff PCR and other stool studies  Active Problems:   Dehydration -Resolved with IVF    Weakness generalized -secondary to diarrhea and dehydration -PT eval  ARF -likely secondary to diarrhea -re-check lytes in am  Hx of Breast Cancer -resume Tamoxifen  Hx of Dyslipidemia -resume Statins  Disposition: Remain inpatient  DVT Prophylaxis: Prophylactic Lovenox   Code Status: Full code or DNR  Family Communication None at bedside  Procedures:  None  CONSULTS:  None  Time spent 40 minutes-which includes 50% of the time with face-to-face with patient/ family and coordinating care related to the above assessment and plan.    MEDICATIONS: Scheduled Meds: . tiotropium  18 mcg Inhalation Daily  . vancomycin  125 mg Oral 4 times per day   Continuous Infusions: . sodium chloride 100 mL/hr at 11/15/13 0544   PRN Meds:.ondansetron (ZOFRAN) IV, ondansetron  Antibiotics: Anti-infectives   Start     Dose/Rate Route Frequency Ordered Stop   11/15/13 0100  vancomycin (VANCOCIN) 50 mg/mL oral solution 125 mg     125 mg Oral 4 times per day 11/15/13 0048         PHYSICAL EXAM: Vital signs in last 24 hours: Filed Vitals:   11/14/13 2300 11/15/13 0522 11/15/13 0754  BP: 94/68 143/83   Pulse: 103 110   Temp: 97.5 F (36.4 C) 98.7 F (37.1 C)   TempSrc: Oral Oral    Resp: 16 16   Height:  5\' 1"  (1.549 m)   Weight: 39.9 kg (87 lb 15.4 oz) 39.9 kg (87 lb 15.4 oz)   SpO2: 94% 92% 94%    Weight change:  Filed Weights   11/14/13 2300 11/15/13 0522  Weight: 39.9 kg (87 lb 15.4 oz) 39.9 kg (87 lb 15.4 oz)   Body mass index is 16.63 kg/(m^2).   Gen Exam: Awake and alert with clear speech.   Neck: Supple, No JVD.   Chest: B/L Clear.   CVS: S1 S2 Regular, no murmurs.  Abdomen: soft, BS +, non tender, non distended.  Extremities: no edema, lower extremities warm to touch. Neurologic: Non Focal.   Skin: No Rash.   Wounds: N/A.    Intake/Output from previous day:  Intake/Output Summary (Last 24 hours) at 11/15/13 0943 Last data filed at 11/15/13 0544  Gross per 24 hour  Intake    240 ml  Output      0 ml  Net    240 ml     LAB RESULTS: CBC  Recent Labs Lab 11/15/13 0150  WBC 20.2*  HGB 15.4*  HCT 44.1  PLT 240  MCV 92.6  MCH 32.4  MCHC 34.9  RDW 14.8  LYMPHSABS 1.4  MONOABS 1.6*  EOSABS 0.0  BASOSABS 0.0    Chemistries   Recent Labs Lab 11/15/13 0150  NA 137  K 3.7  CL 97  CO2 23  GLUCOSE 122*  BUN 28*  CREATININE 1.61*  CALCIUM 8.3*  CBG: No results found for this basename: GLUCAP,  in the last 168 hours  GFR Estimated Creatinine Clearance: 18.1 ml/min (by C-G formula based on Cr of 1.61).  Coagulation profile No results found for this basename: INR, PROTIME,  in the last 168 hours  Cardiac Enzymes No results found for this basename: CK, CKMB, TROPONINI, MYOGLOBIN,  in the last 168 hours  No components found with this basename: POCBNP,  No results found for this basename: DDIMER,  in the last 72 hours No results found for this basename: HGBA1C,  in the last 72 hours No results found for this basename: CHOL, HDL, LDLCALC, TRIG, CHOLHDL, LDLDIRECT,  in the last 72 hours No results found for this basename: TSH, T4TOTAL, FREET3, T3FREE, THYROIDAB,  in the last 72 hours No results found for this basename:  VITAMINB12, FOLATE, FERRITIN, TIBC, IRON, RETICCTPCT,  in the last 72 hours No results found for this basename: LIPASE, AMYLASE,  in the last 72 hours  Urine Studies No results found for this basename: UACOL, UAPR, USPG, UPH, UTP, UGL, UKET, UBIL, UHGB, UNIT, UROB, ULEU, UEPI, UWBC, URBC, UBAC, CAST, CRYS, UCOM, BILUA,  in the last 72 hours  MICROBIOLOGY: No results found for this or any previous visit (from the past 240 hour(s)).  RADIOLOGY STUDIES/RESULTS: No results found.  Jonetta Osgood, MD  [facility] Pager:[phone]  If 7PM-7AM, please contact night-coverage [email or web address] Password [email or web address] 11/15/2013, 9:43 AM   LOS: 1 day   **Disclaimer: This note may have been dictated with voice recognition software. Similar sounding words can inadvertently be transcribed and this note may contain transcription errors which may not have been corrected upon publication of note.**

## 2013-11-15 NOTE — Telephone Encounter (Signed)
See note

## 2013-11-16 DIAGNOSIS — C50919 Malignant neoplasm of unspecified site of unspecified female breast: Secondary | ICD-10-CM

## 2013-11-16 DIAGNOSIS — N179 Acute kidney failure, unspecified: Secondary | ICD-10-CM

## 2013-11-16 DIAGNOSIS — K5289 Other specified noninfective gastroenteritis and colitis: Secondary | ICD-10-CM

## 2013-11-16 LAB — CBC WITH DIFFERENTIAL/PLATELET
Basophils Absolute: 0 10*3/uL (ref 0.0–0.1)
Basophils Relative: 0 % (ref 0–1)
Eosinophils Absolute: 0.1 10*3/uL (ref 0.0–0.7)
Eosinophils Relative: 1 % (ref 0–5)
HCT: 36.4 % (ref 36.0–46.0)
HEMOGLOBIN: 12.1 g/dL (ref 12.0–15.0)
Lymphocytes Relative: 6 % — ABNORMAL LOW (ref 12–46)
Lymphs Abs: 0.9 10*3/uL (ref 0.7–4.0)
MCH: 31.3 pg (ref 26.0–34.0)
MCHC: 33.2 g/dL (ref 30.0–36.0)
MCV: 94.1 fL (ref 78.0–100.0)
MONO ABS: 1 10*3/uL (ref 0.1–1.0)
Monocytes Relative: 7 % (ref 3–12)
Neutro Abs: 13.5 10*3/uL — ABNORMAL HIGH (ref 1.7–7.7)
Neutrophils Relative %: 87 % — ABNORMAL HIGH (ref 43–77)
Platelets: 206 10*3/uL (ref 150–400)
RBC: 3.87 MIL/uL (ref 3.87–5.11)
RDW: 15.1 % (ref 11.5–15.5)
WBC: 15.6 10*3/uL — ABNORMAL HIGH (ref 4.0–10.5)

## 2013-11-16 LAB — BASIC METABOLIC PANEL
BUN: 19 mg/dL (ref 6–23)
CO2: 21 mEq/L (ref 19–32)
CREATININE: 1.01 mg/dL (ref 0.50–1.10)
Calcium: 7.7 mg/dL — ABNORMAL LOW (ref 8.4–10.5)
Chloride: 101 mEq/L (ref 96–112)
GFR, EST AFRICAN AMERICAN: 60 mL/min — AB (ref >90)
GFR, EST NON AFRICAN AMERICAN: 52 mL/min — AB (ref >90)
Glucose, Bld: 71 mg/dL (ref 70–99)
Potassium: 3.2 mEq/L — ABNORMAL LOW (ref 3.7–5.3)
Sodium: 136 mEq/L — ABNORMAL LOW (ref 137–147)

## 2013-11-16 MED ORDER — POTASSIUM CHLORIDE 20 MEQ/15ML (10%) PO LIQD
40.0000 meq | Freq: Once | ORAL | Status: AC
  Administered 2013-11-16: 40 meq via ORAL
  Filled 2013-11-16: qty 30

## 2013-11-16 MED ORDER — POTASSIUM CHLORIDE CRYS ER 20 MEQ PO TBCR
40.0000 meq | EXTENDED_RELEASE_TABLET | Freq: Four times a day (QID) | ORAL | Status: AC
  Administered 2013-11-16: 40 meq via ORAL
  Filled 2013-11-16 (×2): qty 2

## 2013-11-16 NOTE — Progress Notes (Signed)
PATIENT DETAILS Name: Ruth [name] Age: 80 y.o. Sex: female Date of Birth: [birth year] Admit Date: 11/14/2013 Admitting Physician Phillips Grout, MD URK:[id number],EGBTD F, MD  Subjective: Had only 2 bowel movements since yesterday morning.  Assessment/Plan:  Principal Problem:   Suspected Recurrent Clostridium difficile diarrhea -Recently finished treatment with Flagyl on 5/11 for first episode of C Diff, only to have recurrent diarrhea a few days back. -Admitted for weakness and worsening diarrhea.  -GI pathogen panel from outpatient showed positive C. difficile toxin and positive Escherichia coli toxin. -Inpatient workup showed positive C. difficile by PCR, rest of GI pathogen panel is pending. -Continue oral vancomycin, patient is nontoxic, followed WBC and diarrhea.  Active Problems:   Dehydration -Resolved with IVF  Leukocytosis -WBC of 20.2 on admission, today WBC is 15.6. -This is likely secondary to C. difficile colitis.    Weakness generalized -Secondary to diarrhea and dehydration -PT eval  ARF -likely secondary to diarrhea - resolved with IV fluid hydration, presented with creatinine of 1.6, creatinine today is 1.0.   Hx of Breast Cancer -resume Tamoxifen  Hx of Dyslipidemia -resume Statins  Disposition: Remain inpatient  DVT Prophylaxis: Prophylactic Lovenox   Code Status: Full code or DNR  Family Communication None at bedside  Procedures:  None  CONSULTS:  None  Time spent 40 minutes-which includes 50% of the time with face-to-face with patient/ family and coordinating care related to the above assessment and plan.    MEDICATIONS: Scheduled Meds: . aspirin EC  81 mg Oral Daily  . calcium carbonate  3 tablet Oral Daily  . feeding supplement (RESOURCE BREEZE)  1 Container Oral BID BM  . multivitamin with minerals  1 tablet Oral Daily  . omega-3 acid ethyl esters  1 g Oral BID  . saccharomyces boulardii  250 mg Oral BID  .  simvastatin  5 mg Oral q1800  . tamoxifen  20 mg Oral Daily  . tiotropium  18 mcg Inhalation Daily  . vancomycin  125 mg Oral 4 times per day   Continuous Infusions:   PRN Meds:.ondansetron (ZOFRAN) IV, ondansetron  Antibiotics: Anti-infectives   Start     Dose/Rate Route Frequency Ordered Stop   11/15/13 0100  vancomycin (VANCOCIN) 50 mg/mL oral solution 125 mg     125 mg Oral 4 times per day 11/15/13 0048         PHYSICAL EXAM: Vital signs in last 24 hours: Filed Vitals:   11/15/13 1319 11/15/13 2022 11/16/13 0601 11/16/13 0745  BP: 128/68 145/86 128/72   Pulse: 108 100 103   Temp: 98.3 F (36.8 C) 98.5 F (36.9 C) 97.8 F (36.6 C)   TempSrc: Oral Oral Oral   Resp: 14 16 14    Height:      Weight:      SpO2: 92% 90% 92% 91%    Weight change:  Filed Weights   11/14/13 2300 11/15/13 0522  Weight: 39.9 kg (87 lb 15.4 oz) 39.9 kg (87 lb 15.4 oz)   Body mass index is 16.63 kg/(m^2).   Gen Exam: Awake and alert with clear speech.   Neck: Supple, No JVD.   Chest: B/L Clear.   CVS: S1 S2 Regular, no murmurs.  Abdomen: soft, BS +, non tender, non distended.  Extremities: no edema, lower extremities warm to touch. Neurologic: Non Focal.   Skin: No Rash.   Wounds: N/A.    Intake/Output from previous day: No intake or output  data in the 24 hours ending 11/16/13 1039   LAB RESULTS: CBC  Recent Labs Lab 11/15/13 0150 11/16/13 0625  WBC 20.2* 15.6*  HGB 15.4* 12.1  HCT 44.1 36.4  PLT 240 206  MCV 92.6 94.1  MCH 32.4 31.3  MCHC 34.9 33.2  RDW 14.8 15.1  LYMPHSABS 1.4 0.9  MONOABS 1.6* 1.0  EOSABS 0.0 0.1  BASOSABS 0.0 0.0    Chemistries   Recent Labs Lab 11/15/13 0150 11/16/13 0625  NA 137 136*  K 3.7 3.2*  CL 97 101  CO2 23 21  GLUCOSE 122* 71  BUN 28* 19  CREATININE 1.61* 1.01  CALCIUM 8.3* 7.7*    CBG: No results found for this basename: GLUCAP,  in the last 168 hours  GFR Estimated Creatinine Clearance: 28.9 ml/min (by C-G formula  based on Cr of 1.01).  Coagulation profile No results found for this basename: INR, PROTIME,  in the last 168 hours  Cardiac Enzymes No results found for this basename: CK, CKMB, TROPONINI, MYOGLOBIN,  in the last 168 hours  No components found with this basename: POCBNP,  No results found for this basename: DDIMER,  in the last 72 hours No results found for this basename: HGBA1C,  in the last 72 hours No results found for this basename: CHOL, HDL, LDLCALC, TRIG, CHOLHDL, LDLDIRECT,  in the last 72 hours No results found for this basename: TSH, T4TOTAL, FREET3, T3FREE, THYROIDAB,  in the last 72 hours No results found for this basename: VITAMINB12, FOLATE, FERRITIN, TIBC, IRON, RETICCTPCT,  in the last 72 hours No results found for this basename: LIPASE, AMYLASE,  in the last 72 hours  Urine Studies No results found for this basename: UACOL, UAPR, USPG, UPH, UTP, UGL, UKET, UBIL, UHGB, UNIT, UROB, ULEU, UEPI, UWBC, URBC, UBAC, CAST, CRYS, UCOM, BILUA,  in the last 72 hours  MICROBIOLOGY: Recent Results (from the past 240 hour(s))  CLOSTRIDIUM DIFFICILE BY PCR     Status: Abnormal   Collection Time    11/15/13  5:34 AM      Result Value Ref Range Status   C difficile by pcr POSITIVE (*) NEGATIVE Final   Comment: CRITICAL RESULT CALLED TO, READ BACK BY AND VERIFIED WITH:     K.BRUNAGIN,RN 1044 11/15/13 M.CAMPBELL    RADIOLOGY STUDIES/RESULTS: No results found.  Verlee Monte, MD  [facility] Pager:[phone]  If 7PM-7AM, please contact night-coverage [email or web address] Password [email or web address] 11/16/2013, 10:39 AM   LOS: 2 days   **Disclaimer: This note may have been dictated with voice recognition software. Similar sounding words can inadvertently be transcribed and this note may contain transcription errors which may not have been corrected upon publication of note.**

## 2013-11-17 LAB — BASIC METABOLIC PANEL
BUN: 13 mg/dL (ref 6–23)
CALCIUM: 7.9 mg/dL — AB (ref 8.4–10.5)
CO2: 25 meq/L (ref 19–32)
CREATININE: 0.9 mg/dL (ref 0.50–1.10)
Chloride: 104 mEq/L (ref 96–112)
GFR calc non Af Amer: 60 mL/min — ABNORMAL LOW (ref >90)
GFR, EST AFRICAN AMERICAN: 69 mL/min — AB (ref >90)
Glucose, Bld: 88 mg/dL (ref 70–99)
Potassium: 4 mEq/L (ref 3.7–5.3)
Sodium: 137 mEq/L (ref 137–147)

## 2013-11-17 LAB — CBC
HCT: 36.9 % (ref 36.0–46.0)
Hemoglobin: 12.6 g/dL (ref 12.0–15.0)
MCH: 32.2 pg (ref 26.0–34.0)
MCHC: 34.1 g/dL (ref 30.0–36.0)
MCV: 94.4 fL (ref 78.0–100.0)
PLATELETS: 200 10*3/uL (ref 150–400)
RBC: 3.91 MIL/uL (ref 3.87–5.11)
RDW: 15 % (ref 11.5–15.5)
WBC: 11.2 10*3/uL — ABNORMAL HIGH (ref 4.0–10.5)

## 2013-11-17 MED ORDER — ACETAMINOPHEN 325 MG PO TABS
650.0000 mg | ORAL_TABLET | Freq: Four times a day (QID) | ORAL | Status: DC | PRN
  Administered 2013-11-17 – 2013-11-19 (×2): 650 mg via ORAL
  Filled 2013-11-17 (×2): qty 2

## 2013-11-17 NOTE — Progress Notes (Addendum)
PATIENT DETAILS Name: Ruth [name] Age: 80 y.o. Sex: female Date of Birth: 1932/10/08 Admit Date: 11/14/2013 Admitting Physician Phillips Grout, MD VPX:[id number],NIOEV F, MD  Subjective: Admitted with worsening diarrhea-only 2 BM's overnight. No other complaints.  Assessment/Plan: Principal Problem:   Suspected Recurrent Clostridium difficile diarrhea -recently finished treatment with Flagyl on 5/11 for first episode of C Diff, only to have recurrent diarrhea a few days back. Admitted for weakness and worsening diarrhea. Outpatient GI pathogen panel positive for both C Diff PCR and Shiga toxin E Coli. Started on oral vancomycin ON 5/23-much improved, down to 2 loose stools/24 hours. Had d/w Dr Johnnye Sima on 5/23, recommended to continue treatment of C Diff, and await further stool studies-Culture and repeat GI pathogen panel -Much improved, no fever, leukocytosis has resolved, diarrhea has slowed down significantly  Active Problems:   Dehydration -Resolved with IVF    Weakness generalized -secondary to diarrhea and dehydration -PT eval  ARF -likely secondary to diarrhea -resolved  Hx of Breast Cancer -c/w Tamoxifen  Hx of Dyslipidemia -c/w Statins  Severe malnutrition  -in the context of chronic illness-c/w Supplements  Disposition: Remain inpatient  DVT Prophylaxis: Prophylactic Lovenox   Code Status: Full code   Family Communication None at bedside  Procedures:  None  CONSULTS:  None  MEDICATIONS: Scheduled Meds: . aspirin EC  81 mg Oral Daily  . calcium carbonate  3 tablet Oral Daily  . feeding supplement (RESOURCE BREEZE)  1 Container Oral BID BM  . multivitamin with minerals  1 tablet Oral Daily  . omega-3 acid ethyl esters  1 g Oral BID  . saccharomyces boulardii  250 mg Oral BID  . simvastatin  5 mg Oral q1800  . tamoxifen  20 mg Oral Daily  . tiotropium  18 mcg Inhalation Daily  . vancomycin  125 mg Oral 4 times per day   Continuous  Infusions:   PRN Meds:.acetaminophen, ondansetron (ZOFRAN) IV, ondansetron  Antibiotics: Anti-infectives   Start     Dose/Rate Route Frequency Ordered Stop   11/15/13 0100  vancomycin (VANCOCIN) 50 mg/mL oral solution 125 mg     125 mg Oral 4 times per day 11/15/13 0048         PHYSICAL EXAM: Vital signs in last 24 hours: Filed Vitals:   11/16/13 0745 11/16/13 1328 11/16/13 2326 11/17/13 0453  BP:  131/83 131/85 128/77  Pulse:  103 108 93  Temp:  98.6 F (37 C) 98.2 F (36.8 C) 98.7 F (37.1 C)  TempSrc:  Oral Oral Oral  Resp:  14 16 14   Height:      Weight:      SpO2: 91% 90% 93% 91%    Weight change:  Filed Weights   11/14/13 2300 11/15/13 0522  Weight: 39.9 kg (87 lb 15.4 oz) 39.9 kg (87 lb 15.4 oz)   Body mass index is 16.63 kg/(m^2).   Gen Exam: Awake and alert with clear speech.   Neck: Supple, No JVD.   Chest: B/L Clear.   CVS: S1 S2 Regular, no murmurs.  Abdomen: soft, BS +, non tender, non distended.  Extremities: no edema, lower extremities warm to touch. Neurologic: Non Focal.   Skin: No Rash.   Wounds: N/A.    Intake/Output from previous day: No intake or output data in the 24 hours ending 11/17/13 1130   LAB RESULTS: CBC  Recent Labs Lab 11/15/13 0150 11/16/13 0625 11/17/13 0435  WBC 20.2* 15.6*  11.2*  HGB 15.4* 12.1 12.6  HCT 44.1 36.4 36.9  PLT 240 206 200  MCV 92.6 94.1 94.4  MCH 32.4 31.3 32.2  MCHC 34.9 33.2 34.1  RDW 14.8 15.1 15.0  LYMPHSABS 1.4 0.9  --   MONOABS 1.6* 1.0  --   EOSABS 0.0 0.1  --   BASOSABS 0.0 0.0  --     Chemistries   Recent Labs Lab 11/15/13 0150 11/16/13 0625 11/17/13 0435  NA 137 136* 137  K 3.7 3.2* 4.0  CL 97 101 104  CO2 23 21 25   GLUCOSE 122* 71 88  BUN 28* 19 13  CREATININE 1.61* 1.01 0.90  CALCIUM 8.3* 7.7* 7.9*    CBG: No results found for this basename: GLUCAP,  in the last 168 hours  GFR Estimated Creatinine Clearance: 32.4 ml/min (by C-G formula based on Cr of  0.9).  Coagulation profile No results found for this basename: INR, PROTIME,  in the last 168 hours  Cardiac Enzymes No results found for this basename: CK, CKMB, TROPONINI, MYOGLOBIN,  in the last 168 hours  No components found with this basename: POCBNP,  No results found for this basename: DDIMER,  in the last 72 hours No results found for this basename: HGBA1C,  in the last 72 hours No results found for this basename: CHOL, HDL, LDLCALC, TRIG, CHOLHDL, LDLDIRECT,  in the last 72 hours No results found for this basename: TSH, T4TOTAL, FREET3, T3FREE, THYROIDAB,  in the last 72 hours No results found for this basename: VITAMINB12, FOLATE, FERRITIN, TIBC, IRON, RETICCTPCT,  in the last 72 hours No results found for this basename: LIPASE, AMYLASE,  in the last 72 hours  Urine Studies No results found for this basename: UACOL, UAPR, USPG, UPH, UTP, UGL, UKET, UBIL, UHGB, UNIT, UROB, ULEU, UEPI, UWBC, URBC, UBAC, CAST, CRYS, UCOM, BILUA,  in the last 72 hours  MICROBIOLOGY: Recent Results (from the past 240 hour(s))  STOOL CULTURE     Status: None   Collection Time    11/15/13  5:34 AM      Result Value Ref Range Status   Specimen Description STOOL   Final   Special Requests NONE   Final   Culture     Final   Value: NO SUSPICIOUS COLONIES, CONTINUING TO HOLD     Performed at [facility]   Report Status PENDING   Incomplete  CLOSTRIDIUM DIFFICILE BY PCR     Status: Abnormal   Collection Time    11/15/13  5:34 AM      Result Value Ref Range Status   C difficile by pcr POSITIVE (*) NEGATIVE Final   Comment: CRITICAL RESULT CALLED TO, READ BACK BY AND VERIFIED WITH:     K.BRUNAGIN,RN 1044 11/15/13 M.CAMPBELL    RADIOLOGY STUDIES/RESULTS: No results found.  Jonetta Osgood, MD  [facility] Pager:[phone]  If 7PM-7AM, please contact night-coverage [email or web address] Password [email or web address] 11/17/2013, 11:30 AM   LOS: 3 days   **Disclaimer: This note may have been  dictated with voice recognition software. Similar sounding words can inadvertently be transcribed and this note may contain transcription errors which may not have been corrected upon publication of note.**

## 2013-11-18 LAB — GI PATHOGEN PANEL BY PCR, STOOL
C difficile toxin A/B: POSITIVE
CAMPYLOBACTER BY PCR: NEGATIVE
CRYPTOSPORIDIUM BY PCR: NEGATIVE
E COLI (ETEC) LT/ST: NEGATIVE
E COLI (STEC): POSITIVE
E coli 0157 by PCR: NEGATIVE
G lamblia by PCR: NEGATIVE
NOROVIRUS G1/G2: NEGATIVE
Rotavirus A by PCR: NEGATIVE
SHIGELLA BY PCR: NEGATIVE
Salmonella by PCR: NEGATIVE

## 2013-11-18 LAB — OVA AND PARASITE EXAMINATION

## 2013-11-18 MED ORDER — VANCOMYCIN 50 MG/ML ORAL SOLUTION: 125.0000 mg | Freq: Four times a day (QID) | ORAL | Status: DC

## 2013-11-18 MED ORDER — ENOXAPARIN SODIUM 40 MG/0.4ML ~~LOC~~ SOLN
40.0000 mg | SUBCUTANEOUS | Status: DC
  Administered 2013-11-18: 40 mg via SUBCUTANEOUS
  Filled 2013-11-18 (×2): qty 0.4

## 2013-11-18 MED ORDER — ENOXAPARIN SODIUM 30 MG/0.3ML ~~LOC~~ SOLN
20.0000 mg | SUBCUTANEOUS | Status: DC
  Administered 2013-11-19: 20 mg via SUBCUTANEOUS
  Filled 2013-11-18 (×2): qty 0.2

## 2013-11-18 MED ORDER — BOOST / RESOURCE BREEZE PO LIQD: 1.0000 | Freq: Two times a day (BID) | ORAL | Status: DC

## 2013-11-18 MED ORDER — SACCHAROMYCES BOULARDII 250 MG PO CAPS: 250.0000 mg | ORAL_CAPSULE | Freq: Two times a day (BID) | ORAL | Status: DC

## 2013-11-18 NOTE — Progress Notes (Addendum)
PATIENT DETAILS Name: Ruth [name] Age: 80 y.o. Sex: female Date of Birth: Apr 11, 1933 Admit Date: 11/14/2013 Admitting Physician Phillips Grout, MD CBJ:[id number],OHYWV F, MD  Subjective: No further abscess  Assessment/Plan: Principal Problem:   Suspected Recurrent Clostridium difficile diarrhea -recently finished treatment with Flagyl on 5/11 for first episode of C Diff, only to have recurrent diarrhea a few days back. Admitted for weakness and worsening diarrhea. Outpatient GI pathogen panel positive for both C Diff toxin and Shiga toxin E Coli. Started on oral vancomycin on 5/23-much improved,with complete resolution of diarrhea and leukocytosis. Had d/w Dr Johnnye Sima on 5/23, recommended to continue treatment of C Diff, and await further stool studies-Culture and repeat GI pathogen panel done during this hospitalization.Stool culture negative to date. Called Lab-told to expect results later this evening.  Active Problems:   Dehydration -Resolved with IVF    Weakness generalized -secondary to diarrhea and dehydration -PT eval  ARF -likely secondary to diarrhea -resolved  Hx of Breast Cancer -c/w Tamoxifen  Hx of Dyslipidemia -c/w Statins  Severe malnutrition  -in the context of chronic illness-c/w Supplements  Disposition: Remain inpatient  DVT Prophylaxis: Prophylactic Lovenox   Code Status: Full code   Family Communication None at bedside this am-but have d/w spouse and daughters a few days back  Procedures:  None  CONSULTS:  None  MEDICATIONS: Scheduled Meds: . aspirin EC  81 mg Oral Daily  . calcium carbonate  3 tablet Oral Daily  . [START ON 11/19/2013] enoxaparin (LOVENOX) injection  20 mg Subcutaneous Q24H  . feeding supplement (RESOURCE BREEZE)  1 Container Oral BID BM  . multivitamin with minerals  1 tablet Oral Daily  . omega-3 acid ethyl esters  1 g Oral BID  . saccharomyces boulardii  250 mg Oral BID  . simvastatin  5 mg Oral q1800    . tamoxifen  20 mg Oral Daily  . tiotropium  18 mcg Inhalation Daily  . vancomycin  125 mg Oral 4 times per day   Continuous Infusions:   PRN Meds:.acetaminophen, ondansetron (ZOFRAN) IV, ondansetron  Antibiotics: Anti-infectives   Start     Dose/Rate Route Frequency Ordered Stop   11/15/13 0100  vancomycin (VANCOCIN) 50 mg/mL oral solution 125 mg     125 mg Oral 4 times per day 11/15/13 0048         PHYSICAL EXAM: Vital signs in last 24 hours: Filed Vitals:   11/17/13 1544 11/17/13 2020 11/18/13 0511 11/18/13 0754  BP: 152/91 172/77 138/84   Pulse: 83 72 84   Temp: 98.5 F (36.9 C) 98.1 F (36.7 C) 97.9 F (36.6 C)   TempSrc: Oral Oral Oral   Resp: 15 14 16    Height:      Weight:      SpO2: 91% 90% 93% 94%    Weight change:  Filed Weights   11/14/13 2300 11/15/13 0522  Weight: 39.9 kg (87 lb 15.4 oz) 39.9 kg (87 lb 15.4 oz)   Body mass index is 16.63 kg/(m^2).   Gen Exam: Awake and alert with clear speech.   Neck: Supple, No JVD.   Chest: B/L Clear.   CVS: S1 S2 Regular, no murmurs.  Abdomen: soft, BS +, non tender, non distended.  Extremities: no edema, lower extremities warm to touch. Neurologic: Non Focal.   Skin: No Rash.   Wounds: N/A.    Intake/Output from previous day:  Intake/Output Summary (Last 24 hours) at 11/18/13 1020  Last data filed at 11/17/13 2131  Gross per 24 hour  Intake    240 ml  Output      0 ml  Net    240 ml     LAB RESULTS: CBC  Recent Labs Lab 11/15/13 0150 11/16/13 0625 11/17/13 0435  WBC 20.2* 15.6* 11.2*  HGB 15.4* 12.1 12.6  HCT 44.1 36.4 36.9  PLT 240 206 200  MCV 92.6 94.1 94.4  MCH 32.4 31.3 32.2  MCHC 34.9 33.2 34.1  RDW 14.8 15.1 15.0  LYMPHSABS 1.4 0.9  --   MONOABS 1.6* 1.0  --   EOSABS 0.0 0.1  --   BASOSABS 0.0 0.0  --     Chemistries   Recent Labs Lab 11/15/13 0150 11/16/13 0625 11/17/13 0435  NA 137 136* 137  K 3.7 3.2* 4.0  CL 97 101 104  CO2 23 21 25   GLUCOSE 122* 71 88  BUN  28* 19 13  CREATININE 1.61* 1.01 0.90  CALCIUM 8.3* 7.7* 7.9*    CBG: No results found for this basename: GLUCAP,  in the last 168 hours  GFR Estimated Creatinine Clearance: 32.4 ml/min (by C-G formula based on Cr of 0.9).  Coagulation profile No results found for this basename: INR, PROTIME,  in the last 168 hours  Cardiac Enzymes No results found for this basename: CK, CKMB, TROPONINI, MYOGLOBIN,  in the last 168 hours  No components found with this basename: POCBNP,  No results found for this basename: DDIMER,  in the last 72 hours No results found for this basename: HGBA1C,  in the last 72 hours No results found for this basename: CHOL, HDL, LDLCALC, TRIG, CHOLHDL, LDLDIRECT,  in the last 72 hours No results found for this basename: TSH, T4TOTAL, FREET3, T3FREE, THYROIDAB,  in the last 72 hours No results found for this basename: VITAMINB12, FOLATE, FERRITIN, TIBC, IRON, RETICCTPCT,  in the last 72 hours No results found for this basename: LIPASE, AMYLASE,  in the last 72 hours  Urine Studies No results found for this basename: UACOL, UAPR, USPG, UPH, UTP, UGL, UKET, UBIL, UHGB, UNIT, UROB, ULEU, UEPI, UWBC, URBC, UBAC, CAST, CRYS, UCOM, BILUA,  in the last 72 hours  MICROBIOLOGY: Recent Results (from the past 240 hour(s))  STOOL CULTURE     Status: None   Collection Time    11/15/13  5:34 AM      Result Value Ref Range Status   Specimen Description STOOL   Final   Special Requests NONE   Final   Culture     Final   Value: NO SUSPICIOUS COLONIES, CONTINUING TO HOLD     Performed at [facility]   Report Status PENDING   Incomplete  CLOSTRIDIUM DIFFICILE BY PCR     Status: Abnormal   Collection Time    11/15/13  5:34 AM      Result Value Ref Range Status   C difficile by pcr POSITIVE (*) NEGATIVE Final   Comment: CRITICAL RESULT CALLED TO, READ BACK BY AND VERIFIED WITH:     K.BRUNAGIN,RN 1044 11/15/13 M.CAMPBELL    RADIOLOGY STUDIES/RESULTS: No results  found.  Jonetta Osgood, MD  [facility] Pager:[phone]  If 7PM-7AM, please contact night-coverage [email or web address] Password [email or web address] 11/18/2013, 10:20 AM   LOS: 4 days   **Disclaimer: This note may have been dictated with voice recognition software. Similar sounding words can inadvertently be transcribed and this note may contain transcription errors which may not have been  corrected upon publication of note.**

## 2013-11-18 NOTE — Progress Notes (Signed)
CARE MANAGEMENT NOTE 11/18/2013  Patient:  LATARIA, COURSER   Account Number:  [id number]  Date Initiated:  11/18/2013  Documentation initiated by:  [facility]  Subjective/Objective Assessment:   c-diff     Action/Plan:   Anticipated DC Date:     Anticipated DC Plan:  [place]  CM consult      Choice offered to / List presented to:             Status of service:  Completed, signed off Medicare Important Message given?   (If response is "NO", the following Medicare IM given date fields will be blank) Date Medicare IM given:  11/18/2013 Date Additional Medicare IM given:  11/18/2013  Discharge Disposition:  HOME/SELF CARE  Per UR Regulation:    If discussed at Long Length of Stay Meetings, dates discussed:    Comments:  11/18/2013 1900 NCM spoke to pt and gave permission to speak with dtr, Laureen Ochs # [phone]. Dtr states she contacted [facility] for Vancomycin oral dosing. They could order medication. NCM explained she can contact [facility] in [facility]. They usually have medication in stock. She will follow up in am. States she will pick pt up at dc. Requesting Unit RN call her if pt dc home on tom. She works during the day. Additional Medicare Im given, placed on chart.  Jonnie Finner RN CCM Case Mgmt phone [phone]

## 2013-11-18 NOTE — Discharge Summary (Signed)
PATIENT DETAILS Name: Ruth [name] Age: 80 y.o. Sex: female Date of Birth: 19-Mar-1933 MRN: [id number]. Admit Date: 11/14/2013 Admitting Physician: Phillips Grout, MD XIP:[id number],JQBHA F, MD  Recommendations for Outpatient Follow-up:  1. Follow stool cultures till final. 2. Minimize antibiotics and PPI  PRIMARY DISCHARGE DIAGNOSIS:  Principal Problem:   Recurrent Clostridium difficile diarrhea Active Problems:   Dehydration   Weakness generalized   Shiga toxin-producing Escherichia coli (E. coli) (STEC), unspecified   Colitis      PAST MEDICAL HISTORY: Past Medical History  Diagnosis Date  . Osteoporosis, unspecified 04/01/2013  . Hyperlipidemia 04/01/2013  . Hearing loss 04/01/2013    DISCHARGE MEDICATIONS:   Medication List         alendronate 70 MG tablet  Commonly known as:  FOSAMAX  Take 70 mg by mouth once a week. Take with a full glass of water on an empty stomach.     calcium carbonate 750 MG chewable tablet  Commonly known as:  TUMS EX  Chew 2 tablets by mouth daily.     ciprofloxacin 500 MG tablet  Commonly known as:  CIPRO  Take 1 tablet (500 mg total) by mouth 2 (two) times daily.     feeding supplement (RESOURCE BREEZE) Liqd  Take 1 Container by mouth 2 (two) times daily between meals.     fish oil-omega-3 fatty acids 1000 MG capsule  Take 1 g by mouth 2 (two) times daily.     multivitamin tablet  Take 1 tablet by mouth daily.     pravastatin 10 MG tablet  Commonly known as:  PRAVACHOL  Take 10 mg by mouth daily.     saccharomyces boulardii 250 MG capsule  Commonly known as:  FLORASTOR  Take 1 capsule (250 mg total) by mouth 2 (two) times daily.     tamoxifen 20 MG tablet  Commonly known as:  NOLVADEX  Take 1 tablet (20 mg total) by mouth daily.     tiotropium 18 MCG inhalation capsule  Commonly known as:  SPIRIVA  Place 18 mcg into inhaler and inhale daily.     vancomycin 50 mg/mL oral solution  Commonly known as:  VANCOCIN  Take  2.5 mLs (125 mg total) by mouth every 6 (six) hours. Take till 11/29/13 and then stop        ALLERGIES:  No Known Allergies  BRIEF HPI:  See H&P, Labs, Consult and Test reports for all details in brief, patient was admitted for diarrhea and abdominal pain. Outpatient GI pathogen panel was positive for C Diff toxin and E Coli shiga toxin as well. Patient was referred to the hospitalist service for further evaluation and treatment.  CONSULTATIONS:   None  PERTINENT RADIOLOGIC STUDIES: No results found.   PERTINENT LAB RESULTS: CBC:  Recent Labs  11/17/13 0435  WBC 11.2*  HGB 12.6  HCT 36.9  PLT 200   CMET CMP     Component Value Date/Time   NA 137 11/17/2013 0435   NA 143 04/01/2013 1106   K 4.0 11/17/2013 0435   K 4.0 04/01/2013 1106   CL 104 11/17/2013 0435   CL 105 03/26/2012 1145   CO2 25 11/17/2013 0435   CO2 28 04/01/2013 1106   GLUCOSE 88 11/17/2013 0435   GLUCOSE 104 04/01/2013 1106   GLUCOSE 89 03/26/2012 1145   BUN 13 11/17/2013 0435   BUN 15.4 04/01/2013 1106   CREATININE 0.90 11/17/2013 0435   CREATININE 1.2* 04/01/2013 1106   CALCIUM  7.9* 11/17/2013 0435   CALCIUM 9.1 04/01/2013 1106   PROT 6.4 11/15/2013 0150   PROT 6.8 04/01/2013 1106   ALBUMIN 2.7* 11/15/2013 0150   ALBUMIN 3.2* 04/01/2013 1106   AST 67* 11/15/2013 0150   AST 17 04/01/2013 1106   ALT 64* 11/15/2013 0150   ALT 12 04/01/2013 1106   ALKPHOS 56 11/15/2013 0150   ALKPHOS 43 04/01/2013 1106   BILITOT 0.4 11/15/2013 0150   BILITOT 0.41 04/01/2013 1106   GFRNONAA 60* 11/17/2013 0435   GFRAA 69* 11/17/2013 0435    GFR Estimated Creatinine Clearance: 32.4 ml/min (by C-G formula based on Cr of 0.9). No results found for this basename: LIPASE, AMYLASE,  in the last 72 hours No results found for this basename: CKTOTAL, CKMB, CKMBINDEX, TROPONINI,  in the last 72 hours No components found with this basename: POCBNP,  No results found for this basename: DDIMER,  in the last 72 hours No results found for this  basename: HGBA1C,  in the last 72 hours No results found for this basename: CHOL, HDL, LDLCALC, TRIG, CHOLHDL, LDLDIRECT,  in the last 72 hours No results found for this basename: TSH, T4TOTAL, FREET3, T3FREE, THYROIDAB,  in the last 72 hours No results found for this basename: VITAMINB12, FOLATE, FERRITIN, TIBC, IRON, RETICCTPCT,  in the last 72 hours Coags: No results found for this basename: PT, INR,  in the last 72 hours Microbiology: Recent Results (from the past 240 hour(s))  STOOL CULTURE     Status: None   Collection Time    11/15/13  5:34 AM      Result Value Ref Range Status   Specimen Description STOOL   Final   Special Requests NONE   Final   Culture     Final   Value: NO SALMONELLA, SHIGELLA, CAMPYLOBACTER, YERSINIA, OR E.COLI 0157:H7 ISOLATED     Performed at [facility]   Report Status 11/19/2013 FINAL   Final  CLOSTRIDIUM DIFFICILE BY PCR     Status: Abnormal   Collection Time    11/15/13  5:34 AM      Result Value Ref Range Status   C difficile by pcr POSITIVE (*) NEGATIVE Final   Comment: CRITICAL RESULT CALLED TO, READ BACK BY AND VERIFIED WITH:     K.BRUNAGIN,RN 1044 11/15/13 M.CAMPBELL  OVA AND PARASITE EXAMINATION     Status: None   Collection Time    11/15/13  5:34 AM      Result Value Ref Range Status   Specimen Description STOOL   Final   Special Requests NONE   Final   Ova and parasites     Final   Value: NO OVA OR PARASITES SEEN ABUNDANT WBC SEEN     Performed at [facility]   Report Status 11/18/2013 FINAL   Final     BRIEF HOSPITAL COURSE:   Suspected Recurrent Clostridium difficile diarrhea  -recently finished treatment with Flagyl on 5/11 for first episode of C Diff, only to have recurrent diarrhea a few days back. Admitted for weakness and worsening diarrhea. Outpatient GI pathogen panel positive for both C Diff toxin and Shiga toxin E Coli. Started on oral vancomycin on 5/23-much improved,with complete resolution of diarrhea  and leukocytosis. Had d/w Dr Ninetta Lights on 5/23, recommended to continue treatment of C Diff, also had Ecoli +ve GI pathogen panel x 2, D/W ID Dr Ninetta Lights will DC on 3 days of PO Cipro + PO Vancomycin on discharge for a total of  2 weeks, stop date 11/29/13.   Dehydration  -Resolved with IVF    Weakness generalized  -secondary to diarrhea and dehydration. Seen by PT-for home health PT.   ARF  -likely secondary to diarrhea  -resolved    Hx of Breast Cancer  -c/w Tamoxifen    Hx of Dyslipidemia  -c/w Statins    Severe malnutrition  -in the context of chronic illness-c/w Supplements on discharge.     TODAY-DAY OF DISCHARGE:  Subjective:   Ruth [name] today has no headache,no chest abdominal pain,no new weakness tingling or numbness, feels much better wants to go home today.  Objective:   Blood pressure 147/77, pulse 89, temperature 97.9 F (36.6 C), temperature source Oral, resp. rate 20, height 5\' 1"  (1.549 m), weight 39.9 kg (87 lb 15.4 oz), SpO2 94.00%. No intake or output data in the 24 hours ending 11/19/13 1040 Filed Weights   11/14/13 2300 11/15/13 0522  Weight: 39.9 kg (87 lb 15.4 oz) 39.9 kg (87 lb 15.4 oz)     Exam Awake Alert, Oriented *3, No new F.N deficits, Normal affect [place].AT,PERRAL Supple Neck,No JVD, No cervical lymphadenopathy appriciated.  Symmetrical Chest wall movement, Good air movement bilaterally, CTAB RRR,No Gallops,Rubs or new Murmurs, No Parasternal Heave +ve B.Sounds, Abd Soft, Non tender, No organomegaly appriciated, No rebound -guarding or rigidity. No Cyanosis, Clubbing or edema, No new Rash or bruise   DISCHARGE CONDITION: Stable   DISPOSITION: Home with home health services   DISCHARGE INSTRUCTIONS:    Activity:  As tolerated   Diet recommendation: Heart Healthy diet  Discharge Instructions   Call MD for:    Complete by:  As directed   For Diarrhea     Diet - low sodium heart healthy    Complete by:  As directed       Discharge instructions    Complete by:  As directed   Follow with Primary MD Marylene Land, MD in 7 days   Get CBC, CMP, checked 7 days by Primary MD and again as instructed by your Primary MD.     Activity: As tolerated with Full fall precautions use walker/cane & assistance as needed   Disposition Home    Diet: Heart Healthy .  For Heart failure patients - Check your Weight same time everyday, if you gain over 2 pounds, or you develop in leg swelling, experience more shortness of breath or chest pain, call your Primary MD immediately. Follow Cardiac Low Salt Diet and 1.8 lit/day fluid restriction.   On your next visit with her primary care physician please Get Medicines reviewed and adjusted.  Please request your Prim.MD to go over all Hospital Tests and Procedure/Radiological results at the follow up, please get all Hospital records sent to your Prim MD by signing hospital release before you go home.   If you experience worsening of your admission symptoms, develop shortness of breath, life threatening emergency, suicidal or homicidal thoughts you must seek medical attention immediately by calling 911 or calling your MD immediately  if symptoms less severe.  You Must read complete instructions/literature along with all the possible adverse reactions/side effects for all the Medicines you take and that have been prescribed to you. Take any new Medicines after you have completely understood and accpet all the possible adverse reactions/side effects.   Do not drive and provide baby sitting services if your were admitted for syncope or siezures until you have seen by Primary MD or a Neurologist and advised to do so  again.  Do not drive when taking Pain medications.    Do not take more than prescribed Pain, Sleep and Anxiety Medications  Special Instructions: If you have smoked or chewed Tobacco  in the last 2 yrs please stop smoking, stop any regular Alcohol  and or any  Recreational drug use.  Wear Seat belts while driving.   Please note  You were cared for by a hospitalist during your hospital stay. If you have any questions about your discharge medications or the care you received while you were in the hospital after you are discharged, you can call the unit and asked to speak with the hospitalist on call if the hospitalist that took care of you is not available. Once you are discharged, your primary care physician will handle any further medical issues. Please note that NO REFILLS for any discharge medications will be authorized once you are discharged, as it is imperative that you return to your primary care physician (or establish a relationship with a primary care physician if you do not have one) for your aftercare needs so that they can reassess your need for medications and monitor your lab values.  Follow with Primary MD Carolyne Fiscal, MD in 7 days   Get CBC, CMP, checked 7 days by Primary MD and again as instructed by your Primary MD.     Activity: As tolerated with Full fall precautions use walker/cane & assistance as needed   Disposition Home    Diet: Heart Healthy .  For Heart failure patients - Check your Weight same time everyday, if you gain over 2 pounds, or you develop in leg swelling, experience more shortness of breath or chest pain, call your Primary MD immediately. Follow Cardiac Low Salt Diet and 1.8 lit/day fluid restriction.   On your next visit with her primary care physician please Get Medicines reviewed and adjusted.  Please request your Prim.MD to go over all Hospital Tests and Procedure/Radiological results at the follow up, please get all Hospital records sent to your Prim MD by signing hospital release before you go home.   If you experience worsening of your admission symptoms, develop shortness of breath, life threatening emergency, suicidal or homicidal thoughts you must seek medical attention immediately by  calling 911 or calling your MD immediately  if symptoms less severe.  You Must read complete instructions/literature along with all the possible adverse reactions/side effects for all the Medicines you take and that have been prescribed to you. Take any new Medicines after you have completely understood and accpet all the possible adverse reactions/side effects.   Do not drive and provide baby sitting services if your were admitted for syncope or siezures until you have seen by Primary MD or a Neurologist and advised to do so again.  Do not drive when taking Pain medications.    Do not take more than prescribed Pain, Sleep and Anxiety Medications  Special Instructions: If you have smoked or chewed Tobacco  in the last 2 yrs please stop smoking, stop any regular Alcohol  and or any Recreational drug use.  Wear Seat belts while driving.   Please note  You were cared for by a hospitalist during your hospital stay. If you have any questions about your discharge medications or the care you received while you were in the hospital after you are discharged, you can call the unit and asked to speak with the hospitalist on call if the hospitalist that took care of you is not available.  Once you are discharged, your primary care physician will handle any further medical issues. Please note that NO REFILLS for any discharge medications will be authorized once you are discharged, as it is imperative that you return to your primary care physician (or establish a relationship with a primary care physician if you do not have one) for your aftercare needs so that they can reassess your need for medications and monitor your lab values.     Increase activity slowly    Complete by:  As directed            Follow-up Information   Follow up with Marylene Land, MD. Schedule an appointment as soon as possible for a visit in 1 week.   Specialty:  Family Medicine   Contact information:   [address] [place] [address] [phone]      Total Time spent on discharge equals 45 minutes.  Signed: Thurnell Lose 11/19/2013 10:40 AM  **Disclaimer: This note may have been dictated with voice recognition software. Similar sounding words can inadvertently be transcribed and this note may contain transcription errors which may not have been corrected upon publication of note.**

## 2013-11-18 NOTE — Evaluation (Signed)
Physical Therapy Evaluation Patient Details Name: Ruth [name] MRN: [id number] DOB: 06/16/1933 Today's Date: 11/18/2013   History of Present Illness  Pt admitted with recurrent Cdiff and Ecoli  Clinical Impression  Pt very pleasant and reports generalized fatigue and weakness since becoming ill. Pt typically very active and drives spouse to dining hall for meals and does not normally use AD. Pt will benefit from acute therapy to maximize balance, strength and activity to return to PLOF. Recommend daily ambulation with nursing and continued HEP.     Follow Up Recommendations Home health PT    Equipment Recommendations  None recommended by PT    Recommendations for Other Services       Precautions / Restrictions Precautions Precautions: Fall Precaution Comments: pt reports one fall grossly 2 mo ago Restrictions Weight Bearing Restrictions: No      Mobility  Bed Mobility Overal bed mobility: Modified Independent                Transfers Overall transfer level: Modified independent                  Ambulation/Gait Ambulation/Gait assistance: Supervision Ambulation Distance (Feet): 120 Feet Assistive device: None Gait Pattern/deviations: Step-through pattern;Decreased stride length     General Gait Details: decreaesed distance due to fatigue  Stairs            Wheelchair Mobility    Modified Rankin (Stroke Patients Only)       Balance Overall balance assessment: History of Falls                                           Pertinent Vitals/Pain No pain    Home Living Family/patient expects to be discharged to:: Assisted living Living Arrangements: Spouse/significant other             Home Equipment: Walker - 2 wheels;Shower seat      Prior Function Level of Independence: Independent         Comments: pt typically independent and attending water aerobics. Since initial Cdiff spouse has been assisting more with  household chores     Hand Dominance        Extremity/Trunk Assessment   Upper Extremity Assessment: Overall WFL for tasks assessed           Lower Extremity Assessment: Generalized weakness      Cervical / Trunk Assessment: Normal  Communication   Communication: No difficulties  Cognition Arousal/Alertness: Awake/alert Behavior During Therapy: WFL for tasks assessed/performed Overall Cognitive Status: Within Functional Limits for tasks assessed                      General Comments      Exercises General Exercises - Lower Extremity Long Arc Quad: Seated;AROM;Both;20 reps Hip Flexion/Marching: AROM;Seated;Both;20 reps      Assessment/Plan    PT Assessment Patient needs continued PT services  PT Diagnosis Difficulty walking;Generalized weakness   PT Problem List Decreased activity tolerance;Decreased balance;Decreased mobility  PT Treatment Interventions Gait training;Balance training;Functional mobility training;Therapeutic activities;Therapeutic exercise;Patient/family education   PT Goals (Current goals can be found in the Care Plan section) Acute Rehab PT Goals Patient Stated Goal: return to water aerobics PT Goal Formulation: With patient Time For Goal Achievement: 12/02/13 Potential to Achieve Goals: Good    Frequency Min 3X/week   Barriers to discharge Decreased caregiver support  Co-evaluation               End of Session   Activity Tolerance: Patient tolerated treatment well Patient left: in chair;with call bell/phone within reach;with chair alarm set;with nursing/sitter in room Nurse Communication: Mobility status         Time: 0801-0822 PT Time Calculation (min): 21 min   Charges:   PT Evaluation $Initial PT Evaluation Tier I: 1 Procedure PT Treatments $Therapeutic Exercise: 8-22 mins   PT G Codes:          Jozef Eisenbeis B Bev Drennen 11/18/2013, 8:31 AM Elwyn Reach, [place]

## 2013-11-18 NOTE — Progress Notes (Signed)
MEDICATION RELATED CONSULT NOTE   Re: Lovenox Dosing  No Known Allergies  Patient Measurements: Height: 5\' 1"  (154.9 cm) Weight: 87 lb 15.4 oz (39.9 kg) IBW/kg (Calculated) : 47.8   Labs:  Recent Labs  11/16/13 0625 11/17/13 0435  WBC 15.6* 11.2*  HGB 12.1 12.6  HCT 36.4 36.9  PLT 206 200  CREATININE 1.01 0.90   Estimated Creatinine Clearance: 32.4 ml/min (by C-G formula based on Cr of 0.9).    Assessment: 80 yo F initiated on Lovenox for VTE prophylaxis. Dose has been adjusted to 0.5 mg/kg/day due to patient low body weight and reduced CrCl.   Plan:  Lovenox 20 mg SQ Q24h.  [employer], Pharm.D., BCPS Clinical Pharmacist Pager [phone] 11/18/2013 9:00 AM

## 2013-11-19 LAB — STOOL CULTURE

## 2013-11-19 MED ORDER — VANCOMYCIN 50 MG/ML ORAL SOLUTION: 125.0000 mg | Freq: Four times a day (QID) | ORAL | Status: DC

## 2013-11-19 MED ORDER — CIPROFLOXACIN HCL 500 MG PO TABS: 500.0000 mg | ORAL_TABLET | Freq: Two times a day (BID) | ORAL | Status: DC

## 2013-11-19 NOTE — Discharge Instructions (Signed)
Follow with Primary MD Marylene Land, MD in 7 days   Get CBC, CMP, checked 7 days by Primary MD and again as instructed by your Primary MD.     Activity: As tolerated with Full fall precautions use walker/cane & assistance as needed   Disposition Home    Diet: Heart Healthy .  For Heart failure patients - Check your Weight same time everyday, if you gain over 2 pounds, or you develop in leg swelling, experience more shortness of breath or chest pain, call your Primary MD immediately. Follow Cardiac Low Salt Diet and 1.8 lit/day fluid restriction.   On your next visit with her primary care physician please Get Medicines reviewed and adjusted.  Please request your Prim.MD to go over all Hospital Tests and Procedure/Radiological results at the follow up, please get all Hospital records sent to your Prim MD by signing hospital release before you go home.   If you experience worsening of your admission symptoms, develop shortness of breath, life threatening emergency, suicidal or homicidal thoughts you must seek medical attention immediately by calling 911 or calling your MD immediately  if symptoms less severe.  You Must read complete instructions/literature along with all the possible adverse reactions/side effects for all the Medicines you take and that have been prescribed to you. Take any new Medicines after you have completely understood and accpet all the possible adverse reactions/side effects.   Do not drive and provide baby sitting services if your were admitted for syncope or siezures until you have seen by Primary MD or a Neurologist and advised to do so again.  Do not drive when taking Pain medications.    Do not take more than prescribed Pain, Sleep and Anxiety Medications  Special Instructions: If you have smoked or chewed Tobacco  in the last 2 yrs please stop smoking, stop any regular Alcohol  and or any Recreational drug use.  Wear Seat belts while  driving.   Please note  You were cared for by a hospitalist during your hospital stay. If you have any questions about your discharge medications or the care you received while you were in the hospital after you are discharged, you can call the unit and asked to speak with the hospitalist on call if the hospitalist that took care of you is not available. Once you are discharged, your primary care physician will handle any further medical issues. Please note that NO REFILLS for any discharge medications will be authorized once you are discharged, as it is imperative that you return to your primary care physician (or establish a relationship with a primary care physician if you do not have one) for your aftercare needs so that they can reassess your need for medications and monitor your lab values.

## 2013-11-19 NOTE — Care Management Note (Addendum)
    Page 1 of 2   11/20/2013     3:27:34 PM CARE MANAGEMENT NOTE 11/20/2013  Patient:  Ruth [name], Ruth [name]   Account Number:  [id number]  Date Initiated:  11/18/2013  Documentation initiated by:  [facility]  Subjective/Objective Assessment:   c-diff     Action/Plan:   Anticipated DC Date:  11/19/2013   Anticipated DC Plan:  [place]  CM consult      [facility] Choice  HOME HEALTH   Choice offered to / List presented to:  C-4 Adult Children        [place] arranged  HH-2 PT      [place]   Status of service:  Completed, signed off Medicare Important Message given?   (If response is "NO", the following Medicare IM given date fields will be blank) Date Medicare IM given:  11/18/2013 Date Additional Medicare IM given:  11/18/2013  Discharge Disposition:  [place]  Per UR Regulation:  Reviewed for med. necessity/level of care/duration of stay  If discussed at [place] of Stay Meetings, dates discussed:    Comments:  11/20/13 [place], BSN [phone] NCM received call from patient's daughter, Ruth [name] [phone], she states they would like to have hhpt set up with [facility],  NCM called MD to put order in for this.  MD states to have [facility] call him and he will do a verbal over the phone for the order.  NCM will fax information to [phone].  She states it may be the first of the week when they will be able to see patient.  11/19/13 [place] RN, BSN [phone] NCM received prior approval for vanc po, patient 's spouse wanted to go to [place] to get it filled.  11/18/2013 1900 NCM spoke to pt and gave permission to speak with dtr, Ruth [name] # [phone]. Dtr states she contacted [facility] for Vancomycin oral dosing. They could order medication. NCM explained she can contact [facility] in [facility]. They usually have medication in stock. She will  follow up in am. States she will pick pt up at dc. Requesting Unit RN call her if pt dc home on tom. She works during the day. Additional Medicare Im given, placed on chart.  Jonnie Finner RN CCM Case Mgmt phone [phone]

## 2013-11-19 NOTE — Progress Notes (Signed)
[name] to be D/C'd Home per MD order.  Discussed with the patient and all questions fully answered.    Medication List         alendronate 70 MG tablet  Commonly known as:  FOSAMAX  Take 70 mg by mouth once a week. Take with a full glass of water on an empty stomach.     calcium carbonate 750 MG chewable tablet  Commonly known as:  TUMS EX  Chew 2 tablets by mouth daily.     ciprofloxacin 500 MG tablet  Commonly known as:  CIPRO  Take 1 tablet (500 mg total) by mouth 2 (two) times daily.     feeding supplement (RESOURCE BREEZE) Liqd  Take 1 Container by mouth 2 (two) times daily between meals.     fish oil-omega-3 fatty acids 1000 MG capsule  Take 1 g by mouth 2 (two) times daily.     multivitamin tablet  Take 1 tablet by mouth daily.     pravastatin 10 MG tablet  Commonly known as:  PRAVACHOL  Take 10 mg by mouth daily.     saccharomyces boulardii 250 MG capsule  Commonly known as:  FLORASTOR  Take 1 capsule (250 mg total) by mouth 2 (two) times daily.     tamoxifen 20 MG tablet  Commonly known as:  NOLVADEX  Take 1 tablet (20 mg total) by mouth daily.     tiotropium 18 MCG inhalation capsule  Commonly known as:  SPIRIVA  Place 18 mcg into inhaler and inhale daily.     vancomycin 50 mg/mL oral solution  Commonly known as:  VANCOCIN  Take 2.5 mLs (125 mg total) by mouth every 6 (six) hours. Take till 11/29/13 and then stop        VVS, Skin clean, dry and intact without evidence of skin break down, no evidence of skin tears noted. IV catheter discontinued intact. Site without signs and symptoms of complications. Dressing and pressure applied.  An After Visit Summary was printed and given to the patient.  D/c education completed with patient/family including follow up instructions, medication list, d/c activities limitations if indicated, with other d/c instructions as indicated by MD - patient able to verbalize understanding, all questions fully answered.    Patient instructed to return to ED, call 911, or call MD for any changes in condition.   Patient escorted via [place], and D/C home via private auto.  Ruth [name] 11/19/2013 3:35 PM

## 2013-11-25 ENCOUNTER — Emergency Department (HOSPITAL_COMMUNITY): Payer: Medicare Other

## 2013-11-25 ENCOUNTER — Encounter (HOSPITAL_COMMUNITY): Payer: Self-pay | Admitting: Emergency Medicine

## 2013-11-25 ENCOUNTER — Inpatient Hospital Stay (HOSPITAL_COMMUNITY)
Admission: EM | Admit: 2013-11-25 | Discharge: 2013-11-28 | DRG: 064 | Disposition: A | Discharge status: Home Health | Payer: Medicare Other | Attending: Internal Medicine | Admitting: Internal Medicine

## 2013-11-25 DIAGNOSIS — S32019A Unspecified fracture of first lumbar vertebra, initial encounter for closed fracture: Secondary | ICD-10-CM

## 2013-11-25 DIAGNOSIS — E785 Hyperlipidemia, unspecified: Secondary | ICD-10-CM

## 2013-11-25 DIAGNOSIS — J449 Chronic obstructive pulmonary disease, unspecified: Secondary | ICD-10-CM | POA: Diagnosis present

## 2013-11-25 DIAGNOSIS — Z7982 Long term (current) use of aspirin: Secondary | ICD-10-CM

## 2013-11-25 DIAGNOSIS — Z79899 Other long term (current) drug therapy: Secondary | ICD-10-CM

## 2013-11-25 DIAGNOSIS — H9190 Unspecified hearing loss, unspecified ear: Secondary | ICD-10-CM

## 2013-11-25 DIAGNOSIS — E876 Hypokalemia: Secondary | ICD-10-CM | POA: Diagnosis not present

## 2013-11-25 DIAGNOSIS — Z853 Personal history of malignant neoplasm of breast: Secondary | ICD-10-CM

## 2013-11-25 DIAGNOSIS — Z8543 Personal history of malignant neoplasm of ovary: Secondary | ICD-10-CM

## 2013-11-25 DIAGNOSIS — I714 Abdominal aortic aneurysm, without rupture, unspecified: Secondary | ICD-10-CM | POA: Diagnosis present

## 2013-11-25 DIAGNOSIS — Z681 Body mass index (BMI) 19 or less, adult: Secondary | ICD-10-CM

## 2013-11-25 DIAGNOSIS — I639 Cerebral infarction, unspecified: Secondary | ICD-10-CM

## 2013-11-25 DIAGNOSIS — R531 Weakness: Secondary | ICD-10-CM

## 2013-11-25 DIAGNOSIS — J4489 Other specified chronic obstructive pulmonary disease: Secondary | ICD-10-CM | POA: Diagnosis present

## 2013-11-25 DIAGNOSIS — A0472 Enterocolitis due to Clostridium difficile, not specified as recurrent: Secondary | ICD-10-CM | POA: Diagnosis present

## 2013-11-25 DIAGNOSIS — W19XXXA Unspecified fall, initial encounter: Secondary | ICD-10-CM | POA: Diagnosis present

## 2013-11-25 DIAGNOSIS — C50919 Malignant neoplasm of unspecified site of unspecified female breast: Secondary | ICD-10-CM

## 2013-11-25 DIAGNOSIS — R55 Syncope and collapse: Secondary | ICD-10-CM

## 2013-11-25 DIAGNOSIS — A0471 Enterocolitis due to Clostridium difficile, recurrent: Secondary | ICD-10-CM

## 2013-11-25 DIAGNOSIS — I6350 Cerebral infarction due to unspecified occlusion or stenosis of unspecified cerebral artery: Principal | ICD-10-CM | POA: Diagnosis present

## 2013-11-25 DIAGNOSIS — F17200 Nicotine dependence, unspecified, uncomplicated: Secondary | ICD-10-CM | POA: Diagnosis present

## 2013-11-25 DIAGNOSIS — E43 Unspecified severe protein-calorie malnutrition: Secondary | ICD-10-CM

## 2013-11-25 DIAGNOSIS — K529 Noninfective gastroenteritis and colitis, unspecified: Secondary | ICD-10-CM

## 2013-11-25 DIAGNOSIS — S32009A Unspecified fracture of unspecified lumbar vertebra, initial encounter for closed fracture: Secondary | ICD-10-CM | POA: Diagnosis present

## 2013-11-25 DIAGNOSIS — E860 Dehydration: Secondary | ICD-10-CM

## 2013-11-25 DIAGNOSIS — M810 Age-related osteoporosis without current pathological fracture: Secondary | ICD-10-CM

## 2013-11-25 DIAGNOSIS — A498 Other bacterial infections of unspecified site: Secondary | ICD-10-CM

## 2013-11-25 HISTORY — DX: Abdominal aortic aneurysm, without rupture, unspecified: I71.40

## 2013-11-25 HISTORY — DX: Malignant (primary) neoplasm, unspecified: C80.1

## 2013-11-25 HISTORY — DX: Abdominal aortic aneurysm, without rupture: I71.4

## 2013-11-25 LAB — URINALYSIS, ROUTINE W REFLEX MICROSCOPIC
BILIRUBIN URINE: NEGATIVE
GLUCOSE, UA: NEGATIVE mg/dL
Hgb urine dipstick: NEGATIVE
KETONES UR: 40 mg/dL — AB
NITRITE: NEGATIVE
PH: 6 (ref 5.0–8.0)
Protein, ur: NEGATIVE mg/dL
Specific Gravity, Urine: 1.023 (ref 1.005–1.030)
Urobilinogen, UA: 0.2 mg/dL (ref 0.0–1.0)

## 2013-11-25 LAB — I-STAT CHEM 8, ED
BUN: 12 mg/dL (ref 6–23)
CHLORIDE: 99 meq/L (ref 96–112)
Calcium, Ion: 1.18 mmol/L (ref 1.13–1.30)
Creatinine, Ser: 1.1 mg/dL (ref 0.50–1.10)
GLUCOSE: 94 mg/dL (ref 70–99)
HEMATOCRIT: 43 % (ref 36.0–46.0)
Hemoglobin: 14.6 g/dL (ref 12.0–15.0)
POTASSIUM: 4.1 meq/L (ref 3.7–5.3)
Sodium: 143 mEq/L (ref 137–147)
TCO2: 24 mmol/L (ref 0–100)

## 2013-11-25 LAB — BASIC METABOLIC PANEL
BUN: 13 mg/dL (ref 6–23)
CO2: 22 mEq/L (ref 19–32)
Calcium: 8.7 mg/dL (ref 8.4–10.5)
Chloride: 98 mEq/L (ref 96–112)
Creatinine, Ser: 0.97 mg/dL (ref 0.50–1.10)
GFR, EST AFRICAN AMERICAN: 63 mL/min — AB (ref >90)
GFR, EST NON AFRICAN AMERICAN: 55 mL/min — AB (ref >90)
Glucose, Bld: 111 mg/dL — ABNORMAL HIGH (ref 70–99)
Potassium: 6.4 mEq/L — ABNORMAL HIGH (ref 3.7–5.3)
SODIUM: 134 meq/L — AB (ref 137–147)

## 2013-11-25 LAB — URINE MICROSCOPIC-ADD ON

## 2013-11-25 LAB — CBC
HCT: 39.3 % (ref 36.0–46.0)
Hemoglobin: 13.4 g/dL (ref 12.0–15.0)
MCH: 32.4 pg (ref 26.0–34.0)
MCHC: 34.1 g/dL (ref 30.0–36.0)
MCV: 94.9 fL (ref 78.0–100.0)
PLATELETS: 393 10*3/uL (ref 150–400)
RBC: 4.14 MIL/uL (ref 3.87–5.11)
RDW: 15.1 % (ref 11.5–15.5)
WBC: 13.6 10*3/uL — ABNORMAL HIGH (ref 4.0–10.5)

## 2013-11-25 LAB — TROPONIN I

## 2013-11-25 MED ORDER — ADULT MULTIVITAMIN W/MINERALS CH
1.0000 | ORAL_TABLET | Freq: Every day | ORAL | Status: DC
  Administered 2013-11-26 – 2013-11-28 (×3): 1 via ORAL
  Filled 2013-11-25 (×3): qty 1

## 2013-11-25 MED ORDER — SACCHAROMYCES BOULARDII 250 MG PO CAPS
250.0000 mg | ORAL_CAPSULE | Freq: Two times a day (BID) | ORAL | Status: DC
  Administered 2013-11-26 – 2013-11-28 (×6): 250 mg via ORAL
  Filled 2013-11-25 (×9): qty 1

## 2013-11-25 MED ORDER — CALCIUM CARBONATE ANTACID 500 MG PO CHEW
3.0000 | CHEWABLE_TABLET | Freq: Every day | ORAL | Status: DC
  Administered 2013-11-26 – 2013-11-27 (×2): 600 mg via ORAL
  Filled 2013-11-25 (×3): qty 3

## 2013-11-25 MED ORDER — ALENDRONATE SODIUM 70 MG PO TABS: 70.0000 mg | ORAL_TABLET | ORAL | Status: DC

## 2013-11-25 MED ORDER — OMEGA-3-ACID ETHYL ESTERS 1 G PO CAPS
1.0000 g | ORAL_CAPSULE | Freq: Two times a day (BID) | ORAL | Status: DC
  Administered 2013-11-26 – 2013-11-28 (×4): 1 g via ORAL
  Filled 2013-11-25 (×7): qty 1

## 2013-11-25 MED ORDER — SODIUM CHLORIDE 0.9 % IV SOLN
INTRAVENOUS | Status: DC
  Administered 2013-11-26 – 2013-11-27 (×3): via INTRAVENOUS

## 2013-11-25 MED ORDER — CIPROFLOXACIN HCL 500 MG PO TABS
500.0000 mg | ORAL_TABLET | Freq: Two times a day (BID) | ORAL | Status: DC
  Filled 2013-11-25: qty 1

## 2013-11-25 MED ORDER — HEPARIN SODIUM (PORCINE) 5000 UNIT/ML IJ SOLN
5000.0000 [IU] | Freq: Three times a day (TID) | INTRAMUSCULAR | Status: DC
  Administered 2013-11-26 – 2013-11-28 (×8): 5000 [IU] via SUBCUTANEOUS
  Filled 2013-11-25 (×10): qty 1

## 2013-11-25 MED ORDER — SODIUM CHLORIDE 0.9 % IV BOLUS (SEPSIS)
1000.0000 mL | Freq: Once | INTRAVENOUS | Status: AC
  Administered 2013-11-25: 1000 mL via INTRAVENOUS

## 2013-11-25 MED ORDER — IOHEXOL 350 MG/ML SOLN
100.0000 mL | Freq: Once | INTRAVENOUS | Status: AC | PRN
  Administered 2013-11-25: 100 mL via INTRAVENOUS

## 2013-11-25 MED ORDER — VANCOMYCIN 50 MG/ML ORAL SOLUTION
125.0000 mg | Freq: Three times a day (TID) | ORAL | Status: DC
  Administered 2013-11-26 – 2013-11-28 (×11): 125 mg via ORAL
  Filled 2013-11-25 (×15): qty 2.5

## 2013-11-25 MED ORDER — SODIUM CHLORIDE 0.9 % IV BOLUS (SEPSIS)
500.0000 mL | Freq: Once | INTRAVENOUS | Status: AC
  Administered 2013-11-25: 500 mL via INTRAVENOUS

## 2013-11-25 MED ORDER — SODIUM CHLORIDE 0.9 % IJ SOLN
3.0000 mL | Freq: Two times a day (BID) | INTRAMUSCULAR | Status: DC
  Administered 2013-11-26 – 2013-11-28 (×3): 3 mL via INTRAVENOUS

## 2013-11-25 MED ORDER — TAMOXIFEN CITRATE 10 MG PO TABS
20.0000 mg | ORAL_TABLET | Freq: Every day | ORAL | Status: DC
  Administered 2013-11-26 – 2013-11-28 (×3): 20 mg via ORAL
  Filled 2013-11-25 (×3): qty 2

## 2013-11-25 MED ORDER — SIMVASTATIN 5 MG PO TABS
5.0000 mg | ORAL_TABLET | Freq: Every day | ORAL | Status: DC
Start: 2013-11-26 — End: 2013-11-28
  Administered 2013-11-26 – 2013-11-27 (×2): 5 mg via ORAL
  Filled 2013-11-25 (×3): qty 1

## 2013-11-25 MED ORDER — TIOTROPIUM BROMIDE MONOHYDRATE 18 MCG IN CAPS
18.0000 ug | ORAL_CAPSULE | Freq: Every day | RESPIRATORY_TRACT | Status: DC
  Administered 2013-11-27 – 2013-11-28 (×2): 18 ug via RESPIRATORY_TRACT
  Filled 2013-11-25 (×2): qty 5

## 2013-11-25 NOTE — ED Notes (Signed)
PA at bedside.

## 2013-11-25 NOTE — ED Notes (Signed)
Pt returned from Ct. Pt remains AO x4. Pt remains mildly confused on certain things but follows all commands. VSS.

## 2013-11-25 NOTE — ED Provider Notes (Signed)
CSN: [id number]     Arrival date & time 11/25/13  1808 History   First MD Initiated Contact with Patient 11/25/13 2002     Chief Complaint  Patient presents with  . Weakness    (Consider location/radiation/quality/duration/timing/severity/associated sxs/prior Treatment) HPI Comments: Patient is a 80 year old female with history of AAA, hyperlipidemia, and osteoporosis, recently discharged on 11/20/2013 for recurrent C Diff and Ecoli +ve on GI pathogen panel who presents today from ALF for weakness. Patient states that around noon today she experienced an episode of weakness where her legs felt as though they gave out from underneath her. Patient's husband was beside her and was able to catch the patient and lower her to the ground. Patient states that she went to bed to rest for a few hours which made her feel better. She states that she attempted to bathe at 1630. She was sitting in a chair in the shower when her husband noticed that the patient suddenly slumped over to the side. Husband states the patient was unresponsive for approximately 5-10 minutes. He states that he tried shaking her and yelling loudly in her ear without any success. Patient spontaneously regained consciousness, after which time husband states she was talking nonsensically and seemed confused. Husband states that her in this episode patient had one episode of bowel incontinence.   Patient and/or husband deny associated fever, chest pain, shortness of breath, vision changes or vision loss, hearing changes or hearing loss, difficulty speaking or swallowing, facial drooping, abdominal pain, nausea or vomiting, melena or hematochezia, numbness/tingling, and weakness. Patient states that she has been compliant with her vancomycin, though it is sometimes difficult for her to remember to take her dose during the night. She states that her stool has been less watery; improved in consistency, but still loose.  Patient is a 80 y.o. female  presenting with weakness. The history is provided by the patient. No language interpreter was used.  Weakness Associated symptoms include weakness.    Past Medical History  Diagnosis Date  . Osteoporosis, unspecified 04/01/2013  . Hyperlipidemia 04/01/2013  . Hearing loss 04/01/2013  . Cancer     cervical / ovarian   . AAA (abdominal aortic aneurysm)    History reviewed. No pertinent past surgical history. History reviewed. No pertinent family history. History  Substance Use Topics  . Smoking status: Current Every Day Smoker -- 1.50 packs/day for 61 years    Types: Cigarettes  . Smokeless tobacco: Never Used  . Alcohol Use: Yes     Comment: rarely   OB History   Grav Para Term Preterm Abortions TAB SAB Ect Mult Living                  Review of Systems  Neurological: Positive for weakness.     Allergies  Review of patient's allergies indicates no known allergies.  Home Medications   Prior to Admission medications   Medication Sig Start Date End Date Taking? Authorizing Provider  alendronate (FOSAMAX) 70 MG tablet Take 70 mg by mouth once a week. Take with a full glass of water on an empty stomach.   Yes Historical Provider, MD  aspirin 81 MG tablet Take 81 mg by mouth daily.   Yes Historical Provider, MD  calcium carbonate (TUMS EX) 750 MG chewable tablet Chew 2 tablets by mouth daily.   Yes Historical Provider, MD  fish oil-omega-3 fatty acids 1000 MG capsule Take 1 g by mouth 2 (two) times daily.   Yes Historical Provider,  MD  Multiple Vitamin (MULTIVITAMIN) tablet Take 1 tablet by mouth daily.   Yes Historical Provider, MD  pravastatin (PRAVACHOL) 10 MG tablet Take 10 mg by mouth daily.   Yes Historical Provider, MD  saccharomyces boulardii (FLORASTOR) 250 MG capsule Take 1 capsule (250 mg total) by mouth 2 (two) times daily. 11/18/13  Yes Shanker Kristeen Mans, MD  tamoxifen (NOLVADEX) 20 MG tablet Take 1 tablet (20 mg total) by mouth daily. 08/18/13  Yes Amy Milda Smart, PA-C   tiotropium (SPIRIVA) 18 MCG inhalation capsule Place 18 mcg into inhaler and inhale daily.   Yes Historical Provider, MD  vancomycin (VANCOCIN) 50 mg/mL oral solution Take 2.5 mLs (125 mg total) by mouth every 6 (six) hours. Take till 11/29/13 and then stop 11/19/13  Yes Thurnell Lose, MD  ciprofloxacin (CIPRO) 500 MG tablet Take 1 tablet (500 mg total) by mouth 2 (two) times daily. 11/19/13   Thurnell Lose, MD   BP 136/82  Pulse 89  Temp(Src) 98 F (36.7 C) (Oral)  Resp 16  SpO2 98%  Physical Exam  Nursing note and vitals reviewed. Constitutional: She is oriented to person, place, and time. She appears well-developed and well-nourished. No distress.  HENT:  Head: Normocephalic and atraumatic.  Mouth/Throat: Oropharynx is clear and moist. No oropharyngeal exudate.  Eyes: Conjunctivae and EOM are normal. Pupils are equal, round, and reactive to light. No scleral icterus.  Neck: Normal range of motion. Neck supple.  Cardiovascular: Normal rate, regular rhythm and normal heart sounds.   No carotid bruits b/l  Pulmonary/Chest: Effort normal. No respiratory distress. She has no wheezes. She has no rales.  No tachypnea or dyspnea  Abdominal: Normal appearance. She exhibits pulsatile midline mass (palpated out to approximately 6cm). There is tenderness (mild periumbilical) in the periumbilical area. There is no rigidity, no rebound, no guarding and no CVA tenderness.  No peritoneal signs  Musculoskeletal: Normal range of motion.  Neurological: She is alert and oriented to person, place, and time. No cranial nerve deficit. Coordination normal.  GCS 15. Speech is goal oriented. Patient moves extremities without ataxia. Neurologic exam nonfocal; no facial drooping, eyebrow raise symmetric.  Skin: Skin is warm and dry. No rash noted. She is not diaphoretic. No erythema. No pallor.  Psychiatric: She has a normal mood and affect. Her behavior is normal.    ED Course  Procedures (including  critical care time) Labs Review Labs Reviewed  CBC - Abnormal; Notable for the following:    WBC 13.6 (*)    All other components within normal limits  BASIC METABOLIC PANEL - Abnormal; Notable for the following:    Sodium 134 (*)    Potassium 6.4 (*)    Glucose, Bld 111 (*)    GFR calc non Af Amer 55 (*)    GFR calc Af Amer 63 (*)    All other components within normal limits  URINALYSIS, ROUTINE W REFLEX MICROSCOPIC - Abnormal; Notable for the following:    Ketones, ur 40 (*)    Leukocytes, UA SMALL (*)    All other components within normal limits  TROPONIN I  TROPONIN I  URINE MICROSCOPIC-ADD ON  I-STAT CHEM 8, ED    Imaging Review Dg Chest 2 View  11/25/2013   CLINICAL DATA:  Weakness, shortness of breath, diarrhea.  EXAM: CHEST  2 VIEW  COMPARISON:  Chest radiograph June 02, 2009.  FINDINGS: The cardiac silhouette is upper limits of normal in size, mildly calcified aortic mediastinal silhouette  is nonsuspicious. Mild chronic interstitial changes with increased lung volumes. Lung fold in the right hemithorax, no pneumothorax.  Surgical clips project in the left chest wall. Osteopenia. Multiple EKG lines overlie the patient and may obscure subtle underlying pathology.  IMPRESSION: Borderline cardiomegaly, COPD without superimposed acute pulmonary process.   Electronically Signed   By: Elon Alas   On: 11/25/2013 22:27   Ct Angio Abd/pel W/ And/or W/o  11/25/2013   CLINICAL DATA:  Low back pain for 1 week. Fell 1 week ago. Generalized weakness.  EXAM: CTA ABDOMEN AND PELVIS wITHOUT AND WITH CONTRAST  TECHNIQUE: Multidetector CT imaging of the abdomen and pelvis was performed using the standard protocol during bolus administration of intravenous contrast. Multiplanar reconstructed images and MIPs were obtained and reviewed to evaluate the vascular anatomy.  CONTRAST:  176mL OMNIPAQUE IOHEXOL 350 MG/ML SOLN  COMPARISON:  None.  FINDINGS: There is an infrarenal abdominal aortic  aneurysm. In measures 7.6 cm in length by 4 cm x 3.8 cm transversely. There is significant mural thrombus along the right tenth posterior margin of the aneurysm. There is diffuse atherosclerotic plaque with calcifications from the descending thoracic aorta through the iliac arteries. No significant stenosis of the aorta or iliac arteries.  Plaque is noted at the origin of the celiac axis. The splenic artery has a separate origin from the aorta adjacent to the celiac axis. These vessels remain widely patent. Plaque is noted at the origin of the superior mesenteric artery with an approximately 50% narrowing. There is plaque at the origins of both renal arteries. There are dual renal arteries on the left. Right renal artery is narrowed by approximately 50% at its origin.  Lung bases show mild interstitial thickening. No edema or infiltrate. Heart is mildly enlarged.  Liver and spleen are unremarkable. Gallbladder not visualized. It is presumed to be surgically absent. The common bile duct is dilated, measuring 9 mm in the pancreatic head. No duct stone or mass is evident. This is presumed to be chronic. Pancreas is unremarkable. No adrenal masses.  Right renal cortical thinning. No renal masses. There are extrarenal pelves bilaterally. No convincing hydronephrosis. The ureters are normal in course and in caliber. Bladder is unremarkable.  Uterus is surgically absent.  No pelvic masses.  There are colonic diverticula. No diverticulitis. The colon is otherwise unremarkable. Small bowel is unremarkable. Appendix not visualized.  Mild compression fracture of L1. This is of unclear chronicity. Posterior aspect of the upper vertebral body mildly bulges into the spinal canal without significant stenosis. No other fractures. Degenerative changes are noted. Bones are diffusely demineralized.  Review of the MIP images confirms the above findings.  IMPRESSION: 1. Fracture of L1, which may be recent and account for the patient's  Back pain. The fracture line is relatively well defined which would support a recent fracture. The chronicity of this could be further evaluated with lumbar spine MRI if desired clinically. 2. Infrarenal abdominal aortic aneurysm measuring 4 cm. No evidence of rupture. 3. Atherosclerotic changes throughout the abdominal aorta and along its branch vessels as detailed above. 4. Multiple other chronic findings as described. L1 fracture is the only evidence of an acute abnormality.   Electronically Signed   By: Lajean Manes M.D.   On: 11/25/2013 22:00     EKG Interpretation None      MDM   Final diagnoses:  Syncope  Fracture of L1 vertebra  Dehydration    Patient presents for generalized weakness. Husband endorses having an  episode of syncope today while sitting in the shower. No head trauma or loss of consciousness. Husband states that his wife was unresponsive for approximately 5-10 minutes before she spontaneously regained consciousness. Syncope associated with one episode of stool incontinence.  Neurologic exam nonfocal. No carotid bruits. Patient has a history of AAA which was approximated to be 6 cm on palpation of the abdomen. CT emergently ordered which ruled out ruptured AAA. Patient has been hydrated in ED with 1.5 liters IVF; hydration delayed secondary to CT imaging. Cardiac workup reassuring. No electrolyte imbalance. Urinalysis does not suggest infection. The work up is reassuring, I believe patient warrants admission for fluid hydration as she has been stooling frequently secondary to her C diff/E. Coli.   Have consulted Dr. Ernestina Patches of [facility] who will evaluate patient and admit.   Filed Vitals:   11/25/13 2255 11/25/13 2256 11/25/13 2256 11/25/13 2316  BP: 138/88   136/82  Pulse: 92 89    Temp:    98 F (36.7 C)  TempSrc:    Oral  Resp:    16  SpO2:   92% 98%      Antonietta Breach, PA-C 11/25/13 2337

## 2013-11-25 NOTE — ED Notes (Signed)
Per family, pt is at home being tx for ongoing C Diff infection. Husband states that today she ate lunch and then she took a shower and "she back so weak that her legs gave out on her." Husband states that pt then laid down and had 1 episode of bowel incontinence. Pt then took another shower where daughter states "she was just talking out of her head. She seemed so confused." Daughter denies any slurred speech or extremity weakness. Pt is now AO x4. States "I just don't feel good. I have been so weak for over a week."

## 2013-11-25 NOTE — ED Notes (Signed)
Ct contacted about immediate scan. This RN to take pt to CT

## 2013-11-25 NOTE — ED Provider Notes (Signed)
Medical screening examination/treatment/procedure(s) were conducted as a shared visit with non-physician practitioner(s) and myself.  I personally evaluated the patient during the encounter.   EKG Interpretation   Date/Time:  Tuesday November 25 2013 18:25:14 EDT Ventricular Rate:  68 PR Interval:  144 QRS Duration: 85 QT Interval:  401 QTC Calculation: 426 R Axis:   84 Text Interpretation:  Sinus rhythm Probable left atrial enlargement  Borderline low voltage, extremity leads Consider right ventricular  hypertrophy Repol abnrm suggests ischemia, diffuse leads ST elevation,  consider anterolateral injury No significant change since last tracing  Confirmed by Branton Einstein  MD, Zyron Deeley ([id number]) on 11/25/2013 11:54:56 PM     Pt to be admitted by [facility] for weakness  Leota Jacobsen, MD 11/25/13 2355

## 2013-11-25 NOTE — ED Notes (Signed)
Per EMS: pt from home with weakness.  Pt ambulated to restroom around 12 and had a near syncopal episode.  Around 430, pt noted to be incontinent of stool with some confusion.  Pt recently discharged from hospital with C.diff.

## 2013-11-26 ENCOUNTER — Encounter (HOSPITAL_COMMUNITY): Payer: Self-pay | Admitting: Neurology

## 2013-11-26 ENCOUNTER — Observation Stay (HOSPITAL_COMMUNITY): Payer: Medicare Other

## 2013-11-26 ENCOUNTER — Inpatient Hospital Stay (HOSPITAL_COMMUNITY): Payer: Medicare Other

## 2013-11-26 DIAGNOSIS — I639 Cerebral infarction, unspecified: Secondary | ICD-10-CM | POA: Diagnosis present

## 2013-11-26 DIAGNOSIS — R55 Syncope and collapse: Secondary | ICD-10-CM

## 2013-11-26 DIAGNOSIS — I369 Nonrheumatic tricuspid valve disorder, unspecified: Secondary | ICD-10-CM

## 2013-11-26 DIAGNOSIS — I6350 Cerebral infarction due to unspecified occlusion or stenosis of unspecified cerebral artery: Principal | ICD-10-CM

## 2013-11-26 DIAGNOSIS — E43 Unspecified severe protein-calorie malnutrition: Secondary | ICD-10-CM | POA: Insufficient documentation

## 2013-11-26 LAB — COMPREHENSIVE METABOLIC PANEL
ALBUMIN: 2.9 g/dL — AB (ref 3.5–5.2)
ALK PHOS: 51 U/L (ref 39–117)
ALT: 12 U/L (ref 0–35)
AST: 20 U/L (ref 0–37)
BILIRUBIN TOTAL: 0.4 mg/dL (ref 0.3–1.2)
BUN: 10 mg/dL (ref 6–23)
CHLORIDE: 102 meq/L (ref 96–112)
CO2: 23 mEq/L (ref 19–32)
Calcium: 8.2 mg/dL — ABNORMAL LOW (ref 8.4–10.5)
Creatinine, Ser: 0.79 mg/dL (ref 0.50–1.10)
GFR calc Af Amer: >90 mL/min (ref >90)
GFR calc non Af Amer: 78 mL/min — ABNORMAL LOW (ref >90)
Glucose, Bld: 82 mg/dL (ref 70–99)
POTASSIUM: 4.6 meq/L (ref 3.7–5.3)
SODIUM: 140 meq/L (ref 137–147)
Total Protein: 6.3 g/dL (ref 6.0–8.3)

## 2013-11-26 LAB — CBC WITH DIFFERENTIAL/PLATELET
BASOS ABS: 0.1 10*3/uL (ref 0.0–0.1)
BASOS PCT: 1 % (ref 0–1)
Eosinophils Absolute: 0 10*3/uL (ref 0.0–0.7)
Eosinophils Relative: 0 % (ref 0–5)
HCT: 36.7 % (ref 36.0–46.0)
HEMOGLOBIN: 12.7 g/dL (ref 12.0–15.0)
Lymphocytes Relative: 14 % (ref 12–46)
Lymphs Abs: 1.7 10*3/uL (ref 0.7–4.0)
MCH: 32.8 pg (ref 26.0–34.0)
MCHC: 34.6 g/dL (ref 30.0–36.0)
MCV: 94.8 fL (ref 78.0–100.0)
MONOS PCT: 5 % (ref 3–12)
Monocytes Absolute: 0.6 10*3/uL (ref 0.1–1.0)
NEUTROS ABS: 9.6 10*3/uL — AB (ref 1.7–7.7)
Neutrophils Relative %: 80 % — ABNORMAL HIGH (ref 43–77)
Platelets: 320 10*3/uL (ref 150–400)
RBC: 3.87 MIL/uL (ref 3.87–5.11)
RDW: 15.1 % (ref 11.5–15.5)
WBC: 11.9 10*3/uL — ABNORMAL HIGH (ref 4.0–10.5)

## 2013-11-26 LAB — MAGNESIUM: Magnesium: 1.7 mg/dL (ref 1.5–2.5)

## 2013-11-26 LAB — PHOSPHORUS: PHOSPHORUS: 2.3 mg/dL (ref 2.3–4.6)

## 2013-11-26 MED ORDER — ASPIRIN 325 MG PO TABS
325.0000 mg | ORAL_TABLET | Freq: Every day | ORAL | Status: DC
  Administered 2013-11-26 – 2013-11-28 (×3): 325 mg via ORAL
  Filled 2013-11-26 (×4): qty 1

## 2013-11-26 MED ORDER — ASPIRIN 300 MG RE SUPP
300.0000 mg | Freq: Every day | RECTAL | Status: DC
  Filled 2013-11-26 (×3): qty 1

## 2013-11-26 MED ORDER — CIPROFLOXACIN HCL 500 MG PO TABS
500.0000 mg | ORAL_TABLET | Freq: Every day | ORAL | Status: DC
  Administered 2013-11-26: 500 mg via ORAL
  Filled 2013-11-26 (×3): qty 1

## 2013-11-26 MED ORDER — ACETAMINOPHEN 325 MG PO TABS
650.0000 mg | ORAL_TABLET | Freq: Four times a day (QID) | ORAL | Status: DC | PRN
  Administered 2013-11-26 – 2013-11-28 (×2): 650 mg via ORAL
  Filled 2013-11-26 (×2): qty 2

## 2013-11-26 NOTE — H&P (Signed)
Hospitalist Admission History and Physical  Patient name: Ruth [name] Medical record number: [id number] Date of birth: 16-Mar-1933 Age: 79 y.o. Gender: female  Primary Care Provider: Marylene Land, MD  Chief Complaint: syncope, weakness History of Present Illness:This is a 80 y.o. year old female with AAA, recurrent diarrhea secondary to C. difficile and sugar toxin producing Escherichia coli presented with generalized weakness and syncope. Patient noted to have been admitted May 22 226 for recurrent diarrhea secondary to C. difficile and Shiga toxin producing Escherichia coli. Patient was placed on by mouth vancomycin as well as Cipro for treatment. Patient states it diarrhea has improved since hospital discharge. Lives in an assisted living facility with her husband. However has had persistent weakness and had a reported episode of syncope earlier today and the patient was down for approximately 5 minutes. Per report, syncopal episode was witnessed by her husband the patient being unresponsive for an extended period of time and the patient regained consciousness and being persistently confused. Also had an episode of bowel incontinence. Patient denies any chest pain, shortness of breath, nausea, diaphoresis prior to onset. Feels like she has been persistently dry since hospital discharge. Feels slight weakness seems to be worse with standing. Presented to ER today hemodynamically stable. Unable to perform orthostatics because of weakness per report. Initial WBC 13.6, K 6.4. K 4.1 on istat recheck. CBC pending. CXR w/ cardiomegaly and COPD. CT angio of abdomen with L 1 compression fracture, stable 4cm infrarenal AAA.  Assessment and Plan: Ruth [name] is a 80 y.o. year old female presenting with syncope/weakness   Syncope/weakness: Suspect is likely multifactorial with primary contribution of dehydration in the setting of recurrent diarrhea. Will hydrate patient. Check 2-D echo and proBNP given  cardiomegaly on chest x-ray. Telemetry bed. Cycle cardiac enzymes. Given confusion with syncopal episode earlier today, check MRI of the brain without contrast to rule out intracranial source. Check a.m. cortisol and vitamin D level. Continue to follow.  Diarrhea: Symptomatically improving per patient though not completely resolved. Continue oral vancomycin and Cipro. Panculture given leukocytosis, question this is accurate given falsely elevated potassium on same blood draw.  Leukocytosis: Recheck white blood cell count given an accurate potassium were recently drawn. Urinalysis probably indicative of infection. Urine culture Cipro should give adequate coverage in the interim. Trend leukocytosis. Continue to follow closely  AAA: Clinically stable on imaging. Continue to follow closely.  L1 compression fracture: Likely secondary to fall in the setting of syncope. Continue with pain control. No focal neurological deficits or radicular symptoms  COPD: clinically stable. Continue spiriva.   FEN/GI: heart healthy diet.  Prophylaxis: sub q heparin  Disposition: pending further evaluation  Code Status:Full Code     Patient Active Problem List   Diagnosis Date Noted  . Syncope 11/25/2013  . Recurrent Clostridium difficile diarrhea 11/15/2013  . Dehydration 11/15/2013  . Weakness generalized 11/15/2013  . Shiga toxin-producing Escherichia coli (E. coli) (STEC), unspecified 11/15/2013  . Colitis 11/15/2013  . Osteoporosis, unspecified 04/01/2013  . Hyperlipidemia 04/01/2013  . Hearing loss 04/01/2013  . Breast cancer 06/13/2011   Past Medical History: Past Medical History  Diagnosis Date  . Osteoporosis, unspecified 04/01/2013  . Hyperlipidemia 04/01/2013  . Hearing loss 04/01/2013  . Cancer     cervical / ovarian   . AAA (abdominal aortic aneurysm)     Past Surgical History: History reviewed. No pertinent past surgical history.  Social History: History   Social History  .  Marital Status: Married  Spouse Name: N/A    Number of Children: N/A  . Years of Education: N/A   Social History Main Topics  . Smoking status: Current Every Day Smoker -- 1.50 packs/day for 61 years    Types: Cigarettes  . Smokeless tobacco: Never Used  . Alcohol Use: Yes     Comment: rarely  . Drug Use: No  . Sexual Activity: No   Other Topics Concern  . None   Social History Narrative  . None    Family History: History reviewed. No pertinent family history.  Allergies: No Known Allergies  Current Facility-Administered Medications  Medication Dose Route Frequency Provider Last Rate Last Dose  . 0.9 %  sodium chloride infusion   Intravenous Continuous Shanda Howells, MD      . alendronate (FOSAMAX) tablet 70 mg  70 mg Oral Weekly Shanda Howells, MD      . calcium carbonate (TUMS EX) chewable tablet 1,500 mg  2 tablet Oral Daily Shanda Howells, MD      . ciprofloxacin (CIPRO) tablet 500 mg  500 mg Oral BID Shanda Howells, MD      . fish oil-omega-3 fatty acids capsule 1 g  1 g Oral BID Shanda Howells, MD      . heparin injection 5,000 Units  5,000 Units Subcutaneous 3 times per day Shanda Howells, MD      . multivitamin tablet 1 tablet  1 tablet Oral Daily Shanda Howells, MD      . saccharomyces boulardii (FLORASTOR) capsule 250 mg  250 mg Oral BID Shanda Howells, MD      . simvastatin (ZOCOR) tablet 5 mg  5 mg Oral q1800 Shanda Howells, MD      . sodium chloride 0.9 % injection 3 mL  3 mL Intravenous Q12H Shanda Howells, MD      . tamoxifen (NOLVADEX) tablet 20 mg  20 mg Oral Daily Shanda Howells, MD      . tiotropium [facility]) inhalation capsule 18 mcg  18 mcg Inhalation Daily Shanda Howells, MD      . vancomycin (VANCOCIN) 50 mg/mL oral solution 125 mg  125 mg Oral 4 times per day Shanda Howells, MD       Current Outpatient Prescriptions  Medication Sig Dispense Refill  . alendronate (FOSAMAX) 70 MG tablet Take 70 mg by mouth once a week. Take with a full glass of water on an  empty stomach.      Marland Kitchen aspirin 81 MG tablet Take 81 mg by mouth daily.      . calcium carbonate (TUMS EX) 750 MG chewable tablet Chew 2 tablets by mouth daily.      . fish oil-omega-3 fatty acids 1000 MG capsule Take 1 g by mouth 2 (two) times daily.      . Multiple Vitamin (MULTIVITAMIN) tablet Take 1 tablet by mouth daily.      . pravastatin (PRAVACHOL) 10 MG tablet Take 10 mg by mouth daily.      Marland Kitchen saccharomyces boulardii (FLORASTOR) 250 MG capsule Take 1 capsule (250 mg total) by mouth 2 (two) times daily.  60 capsule  0  . tamoxifen (NOLVADEX) 20 MG tablet Take 1 tablet (20 mg total) by mouth daily.  90 tablet  2  . tiotropium (SPIRIVA) 18 MCG inhalation capsule Place 18 mcg into inhaler and inhale daily.      . vancomycin (VANCOCIN) 50 mg/mL oral solution Take 2.5 mLs (125 mg total) by mouth every 6 (six) hours. Take till 11/29/13 and then  stop  110 mL  0  . ciprofloxacin (CIPRO) 500 MG tablet Take 1 tablet (500 mg total) by mouth 2 (two) times daily.  6 tablet  0   Review Of Systems: 12 point ROS negative except as noted above in HPI.  Physical Exam: Filed Vitals:   11/26/13 0002  BP: 160/87  Pulse: 100  Temp:   Resp:     General: alert, cooperative and cachectic HEENT: PERRLA and extra ocular movement intact Heart: S1, S2 normal, no murmur, rub or gallop, regular rate and rhythm Lungs: clear to auscultation, no wheezes or rales and unlabored breathing Abdomen: abdomen is soft without significant tenderness, masses, organomegaly or guarding Extremities: extremities normal, atraumatic, no cyanosis or edema Skin:no rashes, no ecchymoses Neurology: normal without focal findings  Labs and Imaging: Lab Results  Component Value Date/Time   NA 143 11/25/2013  8:35 PM   NA 143 04/01/2013 11:06 AM   K 4.1 11/25/2013  8:35 PM   K 4.0 04/01/2013 11:06 AM   CL 99 11/25/2013  8:35 PM   CL 105 03/26/2012 11:45 AM   CO2 22 11/25/2013  6:25 PM   CO2 28 04/01/2013 11:06 AM   BUN 12 11/25/2013  8:35  PM   BUN 15.4 04/01/2013 11:06 AM   CREATININE 1.10 11/25/2013  8:35 PM   CREATININE 1.2* 04/01/2013 11:06 AM   GLUCOSE 94 11/25/2013  8:35 PM   GLUCOSE 104 04/01/2013 11:06 AM   GLUCOSE 89 03/26/2012 11:45 AM   Lab Results  Component Value Date   WBC 13.6* 11/25/2013   HGB 14.6 11/25/2013   HCT 43.0 11/25/2013   MCV 94.9 11/25/2013   PLT 393 11/25/2013   Urinalysis    Component Value Date/Time   COLORURINE YELLOW 11/25/2013 2048   APPEARANCEUR CLEAR 11/25/2013 2048   LABSPEC 1.023 11/25/2013 2048   PHURINE 6.0 11/25/2013 2048   GLUCOSEU NEGATIVE 11/25/2013 2048   HGBUR NEGATIVE 11/25/2013 2048   BILIRUBINUR NEGATIVE 11/25/2013 2048   KETONESUR 40* 11/25/2013 2048   PROTEINUR NEGATIVE 11/25/2013 2048   UROBILINOGEN 0.2 11/25/2013 2048   NITRITE NEGATIVE 11/25/2013 2048   LEUKOCYTESUR SMALL* 11/25/2013 2048       Dg Chest 2 View  11/25/2013   CLINICAL DATA:  Weakness, shortness of breath, diarrhea.  EXAM: CHEST  2 VIEW  COMPARISON:  Chest radiograph June 02, 2009.  FINDINGS: The cardiac silhouette is upper limits of normal in size, mildly calcified aortic mediastinal silhouette is nonsuspicious. Mild chronic interstitial changes with increased lung volumes. Lung fold in the right hemithorax, no pneumothorax.  Surgical clips project in the left chest wall. Osteopenia. Multiple EKG lines overlie the patient and may obscure subtle underlying pathology.  IMPRESSION: Borderline cardiomegaly, COPD without superimposed acute pulmonary process.   Electronically Signed   By: Elon Alas   On: 11/25/2013 22:27   Ct Angio Abd/pel W/ And/or W/o  11/25/2013   CLINICAL DATA:  Low back pain for 1 week. Fell 1 week ago. Generalized weakness.  EXAM: CTA ABDOMEN AND PELVIS wITHOUT AND WITH CONTRAST  TECHNIQUE: Multidetector CT imaging of the abdomen and pelvis was performed using the standard protocol during bolus administration of intravenous contrast. Multiplanar reconstructed images and MIPs were obtained and reviewed to  evaluate the vascular anatomy.  CONTRAST:  188mL OMNIPAQUE IOHEXOL 350 MG/ML SOLN  COMPARISON:  None.  FINDINGS: There is an infrarenal abdominal aortic aneurysm. In measures 7.6 cm in length by 4 cm x 3.8 cm transversely. There is significant  mural thrombus along the right tenth posterior margin of the aneurysm. There is diffuse atherosclerotic plaque with calcifications from the descending thoracic aorta through the iliac arteries. No significant stenosis of the aorta or iliac arteries.  Plaque is noted at the origin of the celiac axis. The splenic artery has a separate origin from the aorta adjacent to the celiac axis. These vessels remain widely patent. Plaque is noted at the origin of the superior mesenteric artery with an approximately 50% narrowing. There is plaque at the origins of both renal arteries. There are dual renal arteries on the left. Right renal artery is narrowed by approximately 50% at its origin.  Lung bases show mild interstitial thickening. No edema or infiltrate. Heart is mildly enlarged.  Liver and spleen are unremarkable. Gallbladder not visualized. It is presumed to be surgically absent. The common bile duct is dilated, measuring 9 mm in the pancreatic head. No duct stone or mass is evident. This is presumed to be chronic. Pancreas is unremarkable. No adrenal masses.  Right renal cortical thinning. No renal masses. There are extrarenal pelves bilaterally. No convincing hydronephrosis. The ureters are normal in course and in caliber. Bladder is unremarkable.  Uterus is surgically absent.  No pelvic masses.  There are colonic diverticula. No diverticulitis. The colon is otherwise unremarkable. Small bowel is unremarkable. Appendix not visualized.  Mild compression fracture of L1. This is of unclear chronicity. Posterior aspect of the upper vertebral body mildly bulges into the spinal canal without significant stenosis. No other fractures. Degenerative changes are noted. Bones are diffusely  demineralized.  Review of the MIP images confirms the above findings.  IMPRESSION: 1. Fracture of L1, which may be recent and account for the patient's Back pain. The fracture line is relatively well defined which would support a recent fracture. The chronicity of this could be further evaluated with lumbar spine MRI if desired clinically. 2. Infrarenal abdominal aortic aneurysm measuring 4 cm. No evidence of rupture. 3. Atherosclerotic changes throughout the abdominal aorta and along its branch vessels as detailed above. 4. Multiple other chronic findings as described. L1 fracture is the only evidence of an acute abnormality.   Electronically Signed   By: Lajean Manes M.D.   On: 11/25/2013 22:00           Shanda Howells MD  Pager: [phone]

## 2013-11-26 NOTE — Progress Notes (Signed)
Nurse extern attempted to get pt orthostatic vital signs, pt stated "I cant stand up for that long". RN will pass on to day shift to attempt to obtain orthostatic vitals. Pt currently resting in bed with call bell within reach. Will continue to monitor.   [place] Breeze

## 2013-11-26 NOTE — Consult Note (Signed)
Referring Physician: Eliseo Squires    Chief Complaint: Stroke  HPI:                                                                                                                                         Ruth [name] is an 80 y.o. female who has been battling with C. Diff since April of this year.  Lives in an assisted living facility with her husband. Yesterday she was going to take a shower and her husband aided her to the shower stall.  Upon entering the shower she felt sick to her stomach and then was noted to slowly ease her was to the floor. Husband states she crawled to her bed where she slept for a few hours. HEr daughter came to the house and family again tried to get her to shower.  As she was in the shower she suddenly became unresponsive, eyes forced to the left, not responding to verbal or tactile stimulation, breathing very heavy and loud.  This lasted for about 5 minutes.  EMS was called.  By the time they arrived she was able to follow commands but was talking nonsensical.  Per family it took a good 15 minutes for her to return to her baseline. Currently she is back to her baseline.  While in the hospital MRI was obtained showing a left frontal sub centimeter infarct.    Date last known well: Date: 11/25/2013 Time last known well: Unable to determine tPA Given: No: out of window  Past Medical History  Diagnosis Date  . Osteoporosis, unspecified 04/01/2013  . Hyperlipidemia 04/01/2013  . Hearing loss 04/01/2013  . Cancer     cervical / ovarian   . AAA (abdominal aortic aneurysm)     History reviewed. No pertinent past surgical history.  Family History  Problem Relation Age of Onset  . Hypertension Mother   . Hypertension Father    Social History:  reports that she has been smoking Cigarettes.  She has a 91.5 pack-year smoking history. She has never used smokeless tobacco. She reports that she drinks alcohol. She reports that she does not use illicit drugs.  Allergies: No Known  Allergies  Medications:  Prior to Admission:  Prescriptions prior to admission  Medication Sig Dispense Refill  . alendronate (FOSAMAX) 70 MG tablet Take 70 mg by mouth once a week. Take with a full glass of water on an empty stomach.      Marland Kitchen aspirin 81 MG tablet Take 81 mg by mouth daily.      . calcium carbonate (TUMS EX) 750 MG chewable tablet Chew 2 tablets by mouth daily.      . fish oil-omega-3 fatty acids 1000 MG capsule Take 1 g by mouth 2 (two) times daily.      . Multiple Vitamin (MULTIVITAMIN) tablet Take 1 tablet by mouth daily.      . pravastatin (PRAVACHOL) 10 MG tablet Take 10 mg by mouth daily.      Marland Kitchen saccharomyces boulardii (FLORASTOR) 250 MG capsule Take 1 capsule (250 mg total) by mouth 2 (two) times daily.  60 capsule  0  . tamoxifen (NOLVADEX) 20 MG tablet Take 1 tablet (20 mg total) by mouth daily.  90 tablet  2  . tiotropium (SPIRIVA) 18 MCG inhalation capsule Place 18 mcg into inhaler and inhale daily.      . vancomycin (VANCOCIN) 50 mg/mL oral solution Take 2.5 mLs (125 mg total) by mouth every 6 (six) hours. Take till 11/29/13 and then stop  110 mL  0  . ciprofloxacin (CIPRO) 500 MG tablet Take 1 tablet (500 mg total) by mouth 2 (two) times daily.  6 tablet  0   Scheduled: . aspirin  300 mg Rectal Daily   Or  . aspirin  325 mg Oral Daily  . calcium carbonate  3 tablet Oral QAC supper  . ciprofloxacin  500 mg Oral QHS  . heparin  5,000 Units Subcutaneous 3 times per day  . multivitamin with minerals  1 tablet Oral Daily  . omega-3 acid ethyl esters  1 g Oral BID  . saccharomyces boulardii  250 mg Oral BID  . simvastatin  5 mg Oral q1800  . sodium chloride  3 mL Intravenous Q12H  . tamoxifen  20 mg Oral Daily  . tiotropium  18 mcg Inhalation Daily  . vancomycin  125 mg Oral TID AC & HS    ROS:                                                                                                                                        History obtained from family  General ROS: negative for - chills, fatigue, fever, night sweats, weight gain or weight loss Psychological ROS: negative for - behavioral disorder, hallucinations, memory difficulties, mood swings or suicidal ideation Ophthalmic ROS: negative for - blurry vision, double vision, eye pain or loss of vision ENT ROS: negative for - epistaxis, nasal discharge, oral lesions, sore throat, tinnitus or vertigo Allergy and Immunology ROS: negative for - hives or itchy/watery eyes Hematological and Lymphatic ROS: negative for -  bleeding problems, bruising or swollen lymph nodes Endocrine ROS: negative for - galactorrhea, hair pattern changes, polydipsia/polyuria or temperature intolerance Respiratory ROS: negative for - cough, hemoptysis, shortness of breath or wheezing Cardiovascular ROS: negative for - chest pain, dyspnea on exertion, edema or irregular heartbeat Gastrointestinal ROS: negative for - abdominal pain, diarrhea, hematemesis, nausea/vomiting or stool incontinence Genito-Urinary ROS: negative for - dysuria, hematuria, incontinence or urinary frequency/urgency Musculoskeletal ROS: negative for - joint swelling or muscular weakness Neurological ROS: as noted in HPI Dermatological ROS: negative for rash and skin lesion changes  Neurologic Examination:                                                                                                      Blood pressure 113/61, pulse 75, temperature 98.5 F (36.9 C), temperature source Oral, resp. rate 16, height 5\' 1"  (1.549 m), weight 40.143 kg (88 lb 8 oz), SpO2 94.00%.   Mental Status: Alert, oriented, thought content appropriate.  Speech fluent without evidence of aphasia.  Able to follow 3 step commands without difficulty. Cranial Nerves: II: Discs flat bilaterally; Visual fields grossly normal, pupils equal, round, reactive to  light and accommodation III,IV, VI: ptosis not present, extra-ocular motions intact bilaterally V,VII: smile symmetric, facial light touch sensation normal bilaterally VIII: hearing decreased bilaterally (has hearing aids) IX,X: gag reflex present XI: bilateral shoulder shrug XII: midline tongue extension without atrophy or fasciculations  Motor: 4/5 throughout Tone and bulk:normal tone throughout; no atrophy noted Sensory: Pinprick and light touch intact throughout, bilaterally Deep Tendon Reflexes:  Right: Upper Extremity   Left: Upper extremity   biceps (C-5 to C-6) 2/4   biceps (C-5 to C-6) 2/4 tricep (C7) 2/4    triceps (C7) 2/4 Brachioradialis (C6) 2/4  Brachioradialis (C6) 2/4  Lower Extremity Lower Extremity  quadriceps (L-2 to L-4) 2/4   quadriceps (L-2 to L-4) 2/4 Achilles (S1) 0/4   Achilles (S1) 0/4  Plantars: Right: downgoing   Left: downgoing Cerebellar: normal finger-to-nose,  normal heel-to-shin test Gait: not tested CV: pulses palpable throughout    Lab Results: Basic Metabolic Panel:  Recent Labs Lab 11/25/13 1825 11/25/13 2035 11/26/13 0144  NA 134* 143 140  K 6.4* 4.1 4.6  CL 98 99 102  CO2 22  --  23  GLUCOSE 111* 94 82  BUN 13 12 10   CREATININE 0.97 1.10 0.79  CALCIUM 8.7  --  8.2*    Liver Function Tests:  Recent Labs Lab 11/26/13 0144  AST 20  ALT 12  ALKPHOS 51  BILITOT 0.4  PROT 6.3  ALBUMIN 2.9*   No results found for this basename: LIPASE, AMYLASE,  in the last 168 hours No results found for this basename: AMMONIA,  in the last 168 hours  CBC:  Recent Labs Lab 11/25/13 1825 11/25/13 2035 11/26/13 0143  WBC 13.6*  --  11.9*  NEUTROABS  --   --  9.6*  HGB 13.4 14.6 12.7  HCT 39.3 43.0 36.7  MCV 94.9  --  94.8  PLT 393  --  320  Cardiac Enzymes:  Recent Labs Lab 11/25/13 1825 11/25/13 2028  TROPONINI <0.30 <0.30    Lipid Panel: No results found for this basename: CHOL, TRIG, HDL, CHOLHDL, VLDL,  LDLCALC,  in the last 168 hours  CBG: No results found for this basename: GLUCAP,  in the last 168 hours  Microbiology: Results for orders placed during the hospital encounter of 11/14/13  STOOL CULTURE     Status: None   Collection Time    11/15/13  5:34 AM      Result Value Ref Range Status   Specimen Description STOOL   Final   Special Requests NONE   Final   Culture     Final   Value: NO SALMONELLA, SHIGELLA, CAMPYLOBACTER, YERSINIA, OR E.COLI 0157:H7 ISOLATED     Performed at [facility]   Report Status 11/19/2013 FINAL   Final  CLOSTRIDIUM DIFFICILE BY PCR     Status: Abnormal   Collection Time    11/15/13  5:34 AM      Result Value Ref Range Status   C difficile by pcr POSITIVE (*) NEGATIVE Final   Comment: CRITICAL RESULT CALLED TO, READ BACK BY AND VERIFIED WITH:     K.BRUNAGIN,RN 1044 11/15/13 M.CAMPBELL  OVA AND PARASITE EXAMINATION     Status: None   Collection Time    11/15/13  5:34 AM      Result Value Ref Range Status   Specimen Description STOOL   Final   Special Requests NONE   Final   Ova and parasites     Final   Value: NO OVA OR PARASITES SEEN ABUNDANT WBC SEEN     Performed at [facility]   Report Status 11/18/2013 FINAL   Final    Coagulation Studies: No results found for this basename: LABPROT, INR,  in the last 72 hours  Imaging: Dg Chest 2 View  11/25/2013   CLINICAL DATA:  Weakness, shortness of breath, diarrhea.  EXAM: CHEST  2 VIEW  COMPARISON:  Chest radiograph June 02, 2009.  FINDINGS: The cardiac silhouette is upper limits of normal in size, mildly calcified aortic mediastinal silhouette is nonsuspicious. Mild chronic interstitial changes with increased lung volumes. Lung fold in the right hemithorax, no pneumothorax.  Surgical clips project in the left chest wall. Osteopenia. Multiple EKG lines overlie the patient and may obscure subtle underlying pathology.  IMPRESSION: Borderline cardiomegaly, COPD without superimposed  acute pulmonary process.   Electronically Signed   By: Elon Alas   On: 11/25/2013 22:27   Mr Jodene Nam Head Wo Contrast  11/26/2013   CLINICAL DATA:  Syncope.  Weakness.  EXAM: MRI HEAD WITHOUT CONTRAST  MRA HEAD WITHOUT CONTRAST  TECHNIQUE: Multiplanar, multiecho pulse sequences of the brain and surrounding structures were obtained without intravenous contrast. Angiographic images of the head were obtained using MRA technique without contrast.  COMPARISON:  None.  FINDINGS: MRI HEAD FINDINGS  There is a sub cm acute/subacute white matter infarction in the left frontal white matter towards the vertex. No other acute infarction. No cortical extension.  There chronic small-vessel changes affecting the pons. No focal cerebellar insult. The cerebral hemispheres show moderate to marked chronic small vessel changes throughout the deep and subcortical white matter. No large vessel territory infarction. No mass lesion, hemorrhage, hydrocephalus or extra-axial collection. Major vessels at the base of the brain show flow. No pituitary mass. No fluid in the sinuses, middle ears or mastoids. No skull or skullbase lesion.  MRA HEAD FINDINGS  Both internal carotid arteries are widely patent into the brain. No siphon stenosis. The anterior and middle cerebral vessels are patent without proximal stenosis, aneurysm or vascular malformation. Both vertebral arteries are patent to the basilar. No basilar stenosis. Posterior circulation branch vessels are patent. No proximal vessel disease.  IMPRESSION: Sub cm acute infarction affecting the subcortical white matter in the left frontal lobe at the vertex. No hemorrhage or swelling. No other acute infarction.  Moderate-to-marked chronic small vessel disease throughout the brain.  MR angiography of the large and medium size vessels is negative.   Electronically Signed   By: Nelson Chimes M.D.   On: 11/26/2013 08:51   Mr Brain Wo Contrast  11/26/2013   CLINICAL DATA:  Syncope.   Weakness.  EXAM: MRI HEAD WITHOUT CONTRAST  MRA HEAD WITHOUT CONTRAST  TECHNIQUE: Multiplanar, multiecho pulse sequences of the brain and surrounding structures were obtained without intravenous contrast. Angiographic images of the head were obtained using MRA technique without contrast.  COMPARISON:  None.  FINDINGS: MRI HEAD FINDINGS  There is a sub cm acute/subacute white matter infarction in the left frontal white matter towards the vertex. No other acute infarction. No cortical extension.  There chronic small-vessel changes affecting the pons. No focal cerebellar insult. The cerebral hemispheres show moderate to marked chronic small vessel changes throughout the deep and subcortical white matter. No large vessel territory infarction. No mass lesion, hemorrhage, hydrocephalus or extra-axial collection. Major vessels at the base of the brain show flow. No pituitary mass. No fluid in the sinuses, middle ears or mastoids. No skull or skullbase lesion.  MRA HEAD FINDINGS  Both internal carotid arteries are widely patent into the brain. No siphon stenosis. The anterior and middle cerebral vessels are patent without proximal stenosis, aneurysm or vascular malformation. Both vertebral arteries are patent to the basilar. No basilar stenosis. Posterior circulation branch vessels are patent. No proximal vessel disease.  IMPRESSION: Sub cm acute infarction affecting the subcortical white matter in the left frontal lobe at the vertex. No hemorrhage or swelling. No other acute infarction.  Moderate-to-marked chronic small vessel disease throughout the brain.  MR angiography of the large and medium size vessels is negative.   Electronically Signed   By: Nelson Chimes M.D.   On: 11/26/2013 08:51   Ct Angio Abd/pel W/ And/or W/o  11/25/2013   CLINICAL DATA:  Low back pain for 1 week. Fell 1 week ago. Generalized weakness.  EXAM: CTA ABDOMEN AND PELVIS wITHOUT AND WITH CONTRAST  TECHNIQUE: Multidetector CT imaging of the  abdomen and pelvis was performed using the standard protocol during bolus administration of intravenous contrast. Multiplanar reconstructed images and MIPs were obtained and reviewed to evaluate the vascular anatomy.  CONTRAST:  122mL OMNIPAQUE IOHEXOL 350 MG/ML SOLN  COMPARISON:  None.  FINDINGS: There is an infrarenal abdominal aortic aneurysm. In measures 7.6 cm in length by 4 cm x 3.8 cm transversely. There is significant mural thrombus along the right tenth posterior margin of the aneurysm. There is diffuse atherosclerotic plaque with calcifications from the descending thoracic aorta through the iliac arteries. No significant stenosis of the aorta or iliac arteries.  Plaque is noted at the origin of the celiac axis. The splenic artery has a separate origin from the aorta adjacent to the celiac axis. These vessels remain widely patent. Plaque is noted at the origin of the superior mesenteric artery with an approximately 50% narrowing. There is plaque at the origins of both renal arteries. There are  dual renal arteries on the left. Right renal artery is narrowed by approximately 50% at its origin.  Lung bases show mild interstitial thickening. No edema or infiltrate. Heart is mildly enlarged.  Liver and spleen are unremarkable. Gallbladder not visualized. It is presumed to be surgically absent. The common bile duct is dilated, measuring 9 mm in the pancreatic head. No duct stone or mass is evident. This is presumed to be chronic. Pancreas is unremarkable. No adrenal masses.  Right renal cortical thinning. No renal masses. There are extrarenal pelves bilaterally. No convincing hydronephrosis. The ureters are normal in course and in caliber. Bladder is unremarkable.  Uterus is surgically absent.  No pelvic masses.  There are colonic diverticula. No diverticulitis. The colon is otherwise unremarkable. Small bowel is unremarkable. Appendix not visualized.  Mild compression fracture of L1. This is of unclear  chronicity. Posterior aspect of the upper vertebral body mildly bulges into the spinal canal without significant stenosis. No other fractures. Degenerative changes are noted. Bones are diffusely demineralized.  Review of the MIP images confirms the above findings.  IMPRESSION: 1. Fracture of L1, which may be recent and account for the patient's Back pain. The fracture line is relatively well defined which would support a recent fracture. The chronicity of this could be further evaluated with lumbar spine MRI if desired clinically. 2. Infrarenal abdominal aortic aneurysm measuring 4 cm. No evidence of rupture. 3. Atherosclerotic changes throughout the abdominal aorta and along its branch vessels as detailed above. 4. Multiple other chronic findings as described. L1 fracture is the only evidence of an acute abnormality.   Electronically Signed   By: Lajean Manes M.D.   On: 11/25/2013 22:00     Etta Quill PA-C [facility] [phone]  11/26/2013, 2:53 PM   Patient seen and examined.  Clinical course and management discussed.  Necessary edits performed.  I agree with the above.  Assessment and plan of care developed and discussed below.     Assessment: 80 y.o. female presenting to hospital after suffering a period of time which she was unresponsive followed by a prolonged period of confusion.  MRI reviewed and shows a left frontal lobe sub centimeter infarct.  Given description of forced gaze and prolonged confusion and altered speech raises concern for possible seizure activity versus syncope, either of which may have been a consequence of her acute infarct. Further work up recommended.    Stroke Risk Factors - hyperlipidemia  Recommendations: 1. HgbA1c, fasting lipid panel 2. MRI, MRA  of the brain without contrast 3. PT consult, OT consult, Speech consult 4. Echocardiogram 5. Carotid dopplers 6. Prophylactic therapy-Continue ASA 7. EEG 8. Telemetry monitoring 9. Frequent neuro  checks   Alexis Goodell, MD [facility] [phone]  11/26/2013  5:52 PM

## 2013-11-26 NOTE — Progress Notes (Signed)
VASCULAR LAB PRELIMINARY  PRELIMINARY  PRELIMINARY  PRELIMINARY   Carotid duplex completed.    Preliminary report:  Bilateral:  1-39% ICA stenosis.  Vertebral artery flow is antegrade.     [place], RVS 11/26/2013, 4:52 PM

## 2013-11-26 NOTE — Progress Notes (Signed)
EEG Completed; Results Pending  

## 2013-11-26 NOTE — Progress Notes (Signed)
Echo Lab  2D Echocardiogram completed.  [place], RDCS 11/26/2013 1:24 PM

## 2013-11-26 NOTE — Progress Notes (Addendum)
INITIAL NUTRITION ASSESSMENT  DOCUMENTATION CODES Per approved criteria  -Severe malnutrition in the context of chronic illness -Underweight   INTERVENTION: Magic cup TID with meals, each supplement provides 290 kcal and 9 grams of protein Continue multivitamin with minerals daily RD to follow for nutrition care plan  NUTRITION DIAGNOSIS: Inadequate oral intake related to poor appetite as evidenced by patient report  Goal: Pt to meet >/= 90% of their estimated nutrition needs   Monitor:  PO & supplemental intake, weight, labs, I/O's  Reason for Assessment: Malnutrition Screening Tool Report  80 y.o. female  Admitting Dx: syncope, weakness  ASSESSMENT: 80 y.o. Female with PMH with AAA, recurrent diarrhea secondary to C. Difficile and sugar toxin producing Escherichia coli presented with generalized weakness and syncope.  RD spoke with patient and family at bedside; pt reports her appetite hasn't been very good; her and her husband live at a ALF where they have access to meals; endorses a 9 lb weight loss since April 2015 (severe for time frame); pt does not care for Ensure or [employer], however, was willing to try [employer] supplement; will order on meal trays.  Addendum: patient's daughter inquiring about adding Dextrose to IVF's.  Nutrition Focused Physical Exam:   Subcutaneous Fat:  Orbital Region: N/A  Upper Arm Region: severe depletion  Thoracic and Lumbar Region: N/A   Muscle:  Temple Region:  Clavicle Bone Region: severe depletion  Clavicle and Acromion Bone Region: severe depletion  Scapular Bone Region: N/A  Dorsal Hand: N/A  Patellar Region: N/A  Anterior Thigh Region: N/A  Posterior Calf Region: N/A   Edema: none   Patient meets criteria for severe malnutrition in the context of chronic illness as evidenced by 9% weight loss in < 3 months, severe muscle loss and severe subcutaneous fat loss.  Height: Ht Readings from Last 1 Encounters:   11/26/13 5\' 1"  (1.549 m)    Weight: Wt Readings from Last 1 Encounters:  11/26/13 88 lb 8 oz (40.143 kg)    Ideal Body Weight: 105 lb  % Ideal Body Weight: 83%  Wt Readings from Last 10 Encounters:  11/26/13 88 lb 8 oz (40.143 kg)  11/15/13 87 lb 15.4 oz (39.9 kg)  04/01/13 106 lb 11.2 oz (48.399 kg)  03/26/12 105 lb 1.6 oz (47.673 kg)  06/13/11 113 lb 12.8 oz (51.619 kg)    Usual Body Weight: 106 lb  % Usual Body Weight: 82%  BMI:  Body mass index is 16.73 kg/(m^2).  Estimated Nutritional Needs: Kcal: 1100-1300 Protein: 55-65 gm Fluid: >/= 1.5 L  Skin: Intact  Diet Order: Cardiac  EDUCATION NEEDS: -No education needs identified at this time   Intake/Output Summary (Last 24 hours) at 11/26/13 1124 Last data filed at 11/26/13 0500  Gross per 24 hour  Intake      0 ml  Output    300 ml  Net   -300 ml    Labs:   Recent Labs Lab 11/25/13 1825 11/25/13 2035 11/26/13 0144  NA 134* 143 140  K 6.4* 4.1 4.6  CL 98 99 102  CO2 22  --  23  BUN 13 12 10   CREATININE 0.97 1.10 0.79  CALCIUM 8.7  --  8.2*  GLUCOSE 111* 94 82    Scheduled Meds: . aspirin  300 mg Rectal Daily   Or  . aspirin  325 mg Oral Daily  . calcium carbonate  3 tablet Oral QAC supper  . ciprofloxacin  500 mg  Oral QHS  . heparin  5,000 Units Subcutaneous 3 times per day  . multivitamin with minerals  1 tablet Oral Daily  . omega-3 acid ethyl esters  1 g Oral BID  . saccharomyces boulardii  250 mg Oral BID  . simvastatin  5 mg Oral q1800  . sodium chloride  3 mL Intravenous Q12H  . tamoxifen  20 mg Oral Daily  . tiotropium  18 mcg Inhalation Daily  . vancomycin  125 mg Oral TID AC & HS    Continuous Infusions: . sodium chloride 125 mL/hr at 11/26/13 0203    Past Medical History  Diagnosis Date  . Osteoporosis, unspecified 04/01/2013  . Hyperlipidemia 04/01/2013  . Hearing loss 04/01/2013  . Cancer     cervical / ovarian   . AAA (abdominal aortic aneurysm)     History  reviewed. No pertinent past surgical history.  Arthur Holms, RD, LDN Pager #: [phone] After-Hours Pager #: [phone]

## 2013-11-26 NOTE — Progress Notes (Signed)

## 2013-11-26 NOTE — Progress Notes (Signed)
Patient admitted after midnight by Dr. Ernestina Patches- +CVA on MRI- will order echo,carotid, FLP, HgbA1C PT/OT- passed swallowing eval Added ASA Eulogio Bear DO

## 2013-11-27 DIAGNOSIS — A0472 Enterocolitis due to Clostridium difficile, not specified as recurrent: Secondary | ICD-10-CM

## 2013-11-27 DIAGNOSIS — S32009A Unspecified fracture of unspecified lumbar vertebra, initial encounter for closed fracture: Secondary | ICD-10-CM

## 2013-11-27 LAB — URINE CULTURE

## 2013-11-27 LAB — CBC WITH DIFFERENTIAL/PLATELET
Basophils Absolute: 0.1 10*3/uL (ref 0.0–0.1)
Basophils Relative: 1 % (ref 0–1)
EOS ABS: 0.2 10*3/uL (ref 0.0–0.7)
EOS PCT: 2 % (ref 0–5)
HCT: 31.9 % — ABNORMAL LOW (ref 36.0–46.0)
HEMOGLOBIN: 10.9 g/dL — AB (ref 12.0–15.0)
LYMPHS PCT: 18 % (ref 12–46)
Lymphs Abs: 1.5 10*3/uL (ref 0.7–4.0)
MCH: 32.4 pg (ref 26.0–34.0)
MCHC: 34.2 g/dL (ref 30.0–36.0)
MCV: 94.9 fL (ref 78.0–100.0)
MONOS PCT: 6 % (ref 3–12)
Monocytes Absolute: 0.5 10*3/uL (ref 0.1–1.0)
Neutro Abs: 6.4 10*3/uL (ref 1.7–7.7)
Neutrophils Relative %: 73 % (ref 43–77)
PLATELETS: 288 10*3/uL (ref 150–400)
RBC: 3.36 MIL/uL — AB (ref 3.87–5.11)
RDW: 15.4 % (ref 11.5–15.5)
WBC: 8.7 10*3/uL (ref 4.0–10.5)

## 2013-11-27 LAB — LIPID PANEL
Cholesterol: 85 mg/dL (ref 0–200)
HDL: 40 mg/dL (ref >39)
LDL CALC: 28 mg/dL (ref 0–99)
TRIGLYCERIDES: 83 mg/dL (ref <150)
Total CHOL/HDL Ratio: 2.1 RATIO
VLDL: 17 mg/dL (ref 0–40)

## 2013-11-27 LAB — COMPREHENSIVE METABOLIC PANEL
ALBUMIN: 2.3 g/dL — AB (ref 3.5–5.2)
ALT: 9 U/L (ref 0–35)
AST: 20 U/L (ref 0–37)
Alkaline Phosphatase: 42 U/L (ref 39–117)
BUN: 7 mg/dL (ref 6–23)
CALCIUM: 7.8 mg/dL — AB (ref 8.4–10.5)
CO2: 23 mEq/L (ref 19–32)
Chloride: 109 mEq/L (ref 96–112)
Creatinine, Ser: 0.67 mg/dL (ref 0.50–1.10)
GFR calc Af Amer: >90 mL/min (ref >90)
GFR calc non Af Amer: 82 mL/min — ABNORMAL LOW (ref >90)
Glucose, Bld: 124 mg/dL — ABNORMAL HIGH (ref 70–99)
Potassium: 3.2 mEq/L — ABNORMAL LOW (ref 3.7–5.3)
Sodium: 141 mEq/L (ref 137–147)
Total Bilirubin: 0.3 mg/dL (ref 0.3–1.2)
Total Protein: 5.4 g/dL — ABNORMAL LOW (ref 6.0–8.3)

## 2013-11-27 LAB — HEMOGLOBIN A1C
Hgb A1c MFr Bld: 5.7 % — ABNORMAL HIGH (ref <5.7)
Mean Plasma Glucose: 117 mg/dL — ABNORMAL HIGH (ref <117)

## 2013-11-27 LAB — CORTISOL-AM, BLOOD: Cortisol - AM: 24.7 ug/dL — ABNORMAL HIGH (ref 4.3–22.4)

## 2013-11-27 MED ORDER — POTASSIUM CHLORIDE CRYS ER 20 MEQ PO TBCR
40.0000 meq | EXTENDED_RELEASE_TABLET | Freq: Once | ORAL | Status: AC
  Administered 2013-11-27: 40 meq via ORAL
  Filled 2013-11-27: qty 2

## 2013-11-27 NOTE — Evaluation (Signed)
Physical Therapy Evaluation Patient Details Name: Ruth [name] MRN: [id number] DOB: 1932-10-21 Today's Date: 11/27/2013   History of Present Illness  80 yo admitted s/p fall in shower. MRI (+) Lt frontal lobe surbcortical infarct. CT abdomen showed L1 compression fracture. Pt with syncopal event and workup for seizure currently underway. PMHx- recent cdiff illness (ongoing since 09/29/13) with dehydration, weight loss, HOH, osteoporosis, AAA  Clinical Impression  Pt admitted with syncope with fall. Pt found to have Lt frontal CVA and L1 compression fracture. Current diagnoses complicated by 2 month course of c-diff with dehydration, weight loss, and generalized weakness.Pt currently with functional limitations due to the deficits listed below (see PT Problem List).  Pt will benefit from skilled PT to increase their independence and safety with mobility to allow discharge to the venue listed below.       Follow Up Recommendations Home health PT;Supervision/Assistance - 24 hour    Equipment Recommendations  None recommended by PT    Recommendations for Other Services       Precautions / Restrictions Precautions Precautions: Fall      Mobility  Bed Mobility Overal bed mobility: Modified Independent             General bed mobility comments: recalled to roll on her side prior to come to sitting; forgot to return to supine thru sidelying  Transfers Overall transfer level: Needs assistance Equipment used: Rolling walker (2 wheeled) Transfers: Sit to/from Stand Sit to Stand: Supervision         General transfer comment: supervision for safety (recent fall and syncope)  Ambulation/Gait Ambulation/Gait assistance: Min guard Ambulation Distance (Feet): 150 Feet Assistive device: Rolling walker (2 wheeled);None Gait Pattern/deviations: Step-through pattern;Trunk flexed     General Gait Details: initial use of RW with pt requiring cues to keep RW closer to her ("stay between  your hands"); acknowledged back pain is less with RW and upright posture; attempted ambulation without RW with pt report incr pain and staggered with head turn to [employer] Mobility    Modified Rankin (Stroke Patients Only)       Balance Overall balance assessment: Needs assistance;History of Falls         Standing balance support: No upper extremity supported Standing balance-Leahy Scale: Fair               High level balance activites: Direction changes;Turns;Sudden stops;Head turns High Level Balance Comments: with RW, no staggering or LOB; without RW, pt with staggering x 1 to Left             Pertinent Vitals/Pain Back pain 5/10 after walking and exercises (supine) Denied dizziness with all changes in position    Home Living Family/patient expects to be discharged to:: Assisted living Living Arrangements: Spouse/significant other   Type of Home: Assisted living         Home Equipment: Gilford Rile - 2 wheels;Shower seat Additional Comments: pt eats in the dining hall for lunch and dinner nightly    Prior Function Level of Independence: Independent with assistive device(s)         Comments: pt just began using RW ~ 1week PTA due to continued weakness from cdiff/dehydration illness (prior walked independently)     Hand Dominance   Dominant Hand: Right    Extremity/Trunk Assessment   Upper Extremity Assessment: Defer to OT evaluation           Lower Extremity  Assessment: Generalized weakness      Cervical / Trunk Assessment: Other exceptions  Communication   Communication: HOH (has hearing aids, not in place)  Cognition Arousal/Alertness: Awake/alert Behavior During Therapy: WFL for tasks assessed/performed Overall Cognitive Status: Impaired/Different from baseline Area of Impairment: Awareness           Awareness: Intellectual (pt not aware of how she injured her back)   General Comments: Pt was unable  to recall how she hurt her back (husband had to tell her she fell); did not recall that she had L1 compression fx (however unclear if anyone had told her of this test result)    General Comments General comments (skin integrity, edema, etc.): Husband and daughter present     Exercises Low Level/ICU Exercises Stabilized Bridging: AROM;Both;Supine;Limitations Stabilized Bridging Limitations: attempted very small lift of hips <1" with pt reporting back pain; instructed to do isometric preparation to lift hips (but do NOT lift hips) and pt reported this was less painfull and agreed to try to repeat isometric exercise      Assessment/Plan    PT Assessment Patient needs continued PT services  PT Diagnosis Difficulty walking;Acute pain   PT Problem List Decreased strength;Decreased activity tolerance;Decreased balance;Decreased mobility;Decreased knowledge of use of DME;Pain  PT Treatment Interventions DME instruction;Gait training;Functional mobility training;Therapeutic activities;Therapeutic exercise;Balance training;Cognitive remediation;Patient/family education   PT Goals (Current goals can be found in the Care Plan section) Acute Rehab PT Goals Patient Stated Goal: go home PT Goal Formulation: With patient/family Time For Goal Achievement: 12/04/13 Potential to Achieve Goals: Good    Frequency Min 3X/week   Barriers to discharge        Co-evaluation               End of Session Equipment Utilized During Treatment: Gait belt Activity Tolerance: Patient limited by pain Patient left: in bed;with call bell/phone within reach;with family/visitor present           Time: 6378-5885 PT Time Calculation (min): 19 min   Charges:   PT Evaluation $Initial PT Evaluation Tier I: 1 Procedure PT Treatments $Gait Training: 8-22 mins   PT G CodesRexanne Mano 12/16/2013, 4:57 PM Pager [phone]

## 2013-11-27 NOTE — Evaluation (Signed)
Speech Language Pathology Evaluation Patient Details Name: Ruth [name] MRN: [id number] DOB: 09-04-1932 Today's Date: 11/27/2013 Time:  -     Problem List:  Patient Active Problem List   Diagnosis Date Noted  . CVA (cerebral infarction) 11/26/2013  . Protein-calorie malnutrition, severe 11/26/2013  . Syncope 11/25/2013  . Recurrent Clostridium difficile diarrhea 11/15/2013  . Dehydration 11/15/2013  . Weakness generalized 11/15/2013  . Shiga toxin-producing Escherichia coli (E. coli) (STEC), unspecified 11/15/2013  . Colitis 11/15/2013  . Osteoporosis, unspecified 04/01/2013  . Hyperlipidemia 04/01/2013  . Hearing loss 04/01/2013  . Breast cancer 06/13/2011   Past Medical History:  Past Medical History  Diagnosis Date  . Osteoporosis, unspecified 04/01/2013  . Hyperlipidemia 04/01/2013  . Hearing loss 04/01/2013  . Cancer     cervical / ovarian   . AAA (abdominal aortic aneurysm)    Past Surgical History: History reviewed. No pertinent past surgical history. HPI:  80 y.o. year old female presenting with syncope/weakness.  PMH:  AAA, recurrent diarrhea, C. difficile, breast cancer and sugar toxin producing Escherichia coli presented with generalized weakness and syncope.  Lives in an assisted living facility with her husband with episode of syncope; unresonsive 5 minutes.  MRI showed sub cm acute infarction affecting the subcortical white matter in the left frontal lobe at the vertex.   Assessment / Plan / Recommendation Clinical Impression  Pt. presents with mild working/prospective memory deficits and recall of new information.  She appears apathetic in regards to current illness which is likely effected by poor awareness.  Speech is intelligible with functional language to express thoughts.  SLP educated pt. on strategies to facilitate working and prospective memory and would benefit from continued ST while on acute.      SLP Assessment  Patient needs continued Speech Lanaguage  Pathology Services    Follow Up Recommendations   (TBD)    Frequency and Duration min 2x/week  2 weeks   Pertinent Vitals/Pain WDL   SLP Goals  SLP Goals Potential to Achieve Goals: Good  SLP Evaluation Prior Functioning  Cognitive/Linguistic Baseline: Within functional limits Type of Home: Assisted living  Lives With: Spouse   Cognition  Overall Cognitive Status: Impaired/Different from baseline Arousal/Alertness: Awake/alert Orientation Level: Oriented to person;Oriented to place;Disoriented to situation;Oriented to time Attention: Sustained Sustained Attention: Appears intact Memory: Impaired Memory Impairment: Retrieval deficit;Decreased recall of new information;Prospective memory Awareness: Impaired Awareness Impairment: Anticipatory impairment;Emergent impairment;Intellectual impairment Problem Solving: Impaired Problem Solving Impairment: Verbal basic (mild impairments) Safety/Judgment: Impaired    Comprehension  Auditory Comprehension Overall Auditory Comprehension: Appears within functional limits for tasks assessed Yes/No Questions: Within Functional Limits Commands: Within Functional Limits Conversation: Simple Visual Recognition/Discrimination Discrimination: Not tested Reading Comprehension Reading Status: Within funtional limits    Expression Expression Primary Mode of Expression: Verbal Verbal Expression Overall Verbal Expression: Appears within functional limits for tasks assessed Initiation: No impairment Level of Generative/Spontaneous Verbalization: Conversation Repetition:  (NT) Naming: No impairment Pragmatics: No impairment Written Expression Dominant Hand: Right Written Expression: Not tested   Oral / Motor Oral Motor/Sensory Function Overall Oral Motor/Sensory Function: Appears within functional limits for tasks assessed Motor Speech Overall Motor Speech: Appears within functional limits for tasks assessed Respiration: Within  functional limits Phonation: Normal Resonance: Within functional limits Articulation: Within functional limitis Intelligibility: Intelligible Motor Planning: Witnin functional limits   GO     Orbie Pyo [employer] [phone]  11/27/2013

## 2013-11-27 NOTE — Procedures (Addendum)
ELECTROENCEPHALOGRAM REPORT   Patient: Ruth [name]       Room #: 2C00 EEG No. ID: [id number] Age: 80 y.o.        Sex: female Referring Physician: Eliseo Squires Report Date:  11/26/2013        Interpreting Physician: Alexis Goodell  History: [name] is an 80 y.o. female admitted with syncope evaluated to rule out seizure  Medications:  Scheduled: . aspirin  300 mg Rectal Daily   Or  . aspirin  325 mg Oral Daily  . calcium carbonate  3 tablet Oral QAC supper  . heparin  5,000 Units Subcutaneous 3 times per day  . multivitamin with minerals  1 tablet Oral Daily  . omega-3 acid ethyl esters  1 g Oral BID  . potassium chloride  40 mEq Oral Once  . saccharomyces boulardii  250 mg Oral BID  . simvastatin  5 mg Oral q1800  . sodium chloride  3 mL Intravenous Q12H  . tamoxifen  20 mg Oral Daily  . tiotropium  18 mcg Inhalation Daily  . vancomycin  125 mg Oral TID AC & HS    Conditions of Recording:  This is a 16 channel EEG carried out with the patient in the awake state.  Description:  The waking background activity consists of a low voltage, symmetrical, fairly well organized, 9-10 Hz alpha activity, seen from the parieto-occipital and posterior temporal regions.  Low voltage fast activity, poorly organized, is seen anteriorly and is at times superimposed on more posterior regions.  A mixture of theta and alpha rhythms are seen from the central and temporal regions. The patient does not drowse or sleep. Hyperventilation and intermittent photic stimulation were not performed.   IMPRESSION: Normal awake electroencephalogram without activation procedures. There are no focal lateralizing or epileptiform features.  Comment:  An EEG with the patient sleep deprived to elicit drowse and light sleep may be desirable to further elicit a possible seizure disorder.     Alexis Goodell, MD [facility] [phone] 11/26/2013, 9:17 PM

## 2013-11-27 NOTE — Progress Notes (Signed)
PROGRESS NOTE  SHIANA GROSSE [id number] DOB: [birth year] DOA: 11/25/2013 PCP: Marylene Land, MD  Assessment/Plan: Syncope- tele ok, echo pending, CE negative  CVA- MRI +, echo pending, carotids ok, FLP ok, HgbA1C, PT/OT eval  c diff- PO vanc with stop date of 11/29/13- cipro was stopped after 3 days, not sure why it was restarted at admission  L1 compression fracture: Likely secondary to fall in the setting of syncope. Continue with pain control. No focal neurological deficits or radicular symptoms   COPD: clinically stable. Continue spiriva  Hypokalemia- replete  ?UTI- await culture  Code Status: full Family Communication: patient Disposition Plan:    Consultants:  neuro  Procedures:  echo    HPI/Subjective: Feeling well this AM  Objective: Filed Vitals:   11/27/13 0552  BP: 122/65  Pulse: 75  Temp: 97.5 F (36.4 C)  Resp: 18    Intake/Output Summary (Last 24 hours) at 11/27/13 1135 Last data filed at 11/27/13 0900  Gross per 24 hour  Intake    240 ml  Output    500 ml  Net   -260 ml   Filed Weights   11/26/13 0124  Weight: 40.143 kg (88 lb 8 oz)    Exam:   General:  A+Ox3, NAD  Cardiovascular: rrr  Respiratory: clear  Abdomen: +Bs, soft  Musculoskeletal: moves all 4 ext   Data Reviewed: Basic Metabolic Panel:  Recent Labs Lab 11/25/13 1825 11/25/13 2035 11/26/13 0144 11/26/13 1745 11/27/13 0350  NA 134* 143 140  --  141  K 6.4* 4.1 4.6  --  3.2*  CL 98 99 102  --  109  CO2 22  --  23  --  23  GLUCOSE 111* 94 82  --  124*  BUN 13 12 10   --  7  CREATININE 0.97 1.10 0.79  --  0.67  CALCIUM 8.7  --  8.2*  --  7.8*  MG  --   --   --  1.7  --   PHOS  --   --   --  2.3  --    Liver Function Tests:  Recent Labs Lab 11/26/13 0144 11/27/13 0350  AST 20 20  ALT 12 9  ALKPHOS 51 42  BILITOT 0.4 0.3  PROT 6.3 5.4*  ALBUMIN 2.9* 2.3*   No results found for this basename: LIPASE, AMYLASE,  in the last 168 hours No results  found for this basename: AMMONIA,  in the last 168 hours CBC:  Recent Labs Lab 11/25/13 1825 11/25/13 2035 11/26/13 0143 11/27/13 0350  WBC 13.6*  --  11.9* 8.7  NEUTROABS  --   --  9.6* 6.4  HGB 13.4 14.6 12.7 10.9*  HCT 39.3 43.0 36.7 31.9*  MCV 94.9  --  94.8 94.9  PLT 393  --  320 288   Cardiac Enzymes:  Recent Labs Lab 11/25/13 1825 11/25/13 2028  TROPONINI <0.30 <0.30   BNP (last 3 results) No results found for this basename: PROBNP,  in the last 8760 hours CBG: No results found for this basename: GLUCAP,  in the last 168 hours  Recent Results (from the past 240 hour(s))  CULTURE, BLOOD (ROUTINE X 2)     Status: None   Collection Time    11/26/13  1:40 AM      Result Value Ref Range Status   Specimen Description BLOOD RIGHT ARM   Final   Special Requests BOTTLES DRAWN AEROBIC ONLY 10CC   Final  Culture  Setup Time     Final   Value: 11/26/2013 08:41     Performed at [facility]   Culture     Final   Value:        BLOOD CULTURE RECEIVED NO GROWTH TO DATE CULTURE WILL BE HELD FOR 5 DAYS BEFORE ISSUING A FINAL NEGATIVE REPORT     Performed at [facility]   Report Status PENDING   Incomplete  CULTURE, BLOOD (ROUTINE X 2)     Status: None   Collection Time    11/26/13  1:45 AM      Result Value Ref Range Status   Specimen Description BLOOD RIGHT ARM   Final   Special Requests BOTTLES DRAWN AEROBIC ONLY 10CC   Final   Culture  Setup Time     Final   Value: 11/26/2013 08:41     Performed at [facility]   Culture     Final   Value:        BLOOD CULTURE RECEIVED NO GROWTH TO DATE CULTURE WILL BE HELD FOR 5 DAYS BEFORE ISSUING A FINAL NEGATIVE REPORT     Performed at [facility]   Report Status PENDING   Incomplete     Studies: Dg Chest 2 View  11/25/2013   CLINICAL DATA:  Weakness, shortness of breath, diarrhea.  EXAM: CHEST  2 VIEW  COMPARISON:  Chest radiograph June 02, 2009.  FINDINGS: The cardiac silhouette is upper  limits of normal in size, mildly calcified aortic mediastinal silhouette is nonsuspicious. Mild chronic interstitial changes with increased lung volumes. Lung fold in the right hemithorax, no pneumothorax.  Surgical clips project in the left chest wall. Osteopenia. Multiple EKG lines overlie the patient and may obscure subtle underlying pathology.  IMPRESSION: Borderline cardiomegaly, COPD without superimposed acute pulmonary process.   Electronically Signed   By: Elon Alas   On: 11/25/2013 22:27   Mr Jodene Nam Head Wo Contrast  11/26/2013   CLINICAL DATA:  Syncope.  Weakness.  EXAM: MRI HEAD WITHOUT CONTRAST  MRA HEAD WITHOUT CONTRAST  TECHNIQUE: Multiplanar, multiecho pulse sequences of the brain and surrounding structures were obtained without intravenous contrast. Angiographic images of the head were obtained using MRA technique without contrast.  COMPARISON:  None.  FINDINGS: MRI HEAD FINDINGS  There is a sub cm acute/subacute white matter infarction in the left frontal white matter towards the vertex. No other acute infarction. No cortical extension.  There chronic small-vessel changes affecting the pons. No focal cerebellar insult. The cerebral hemispheres show moderate to marked chronic small vessel changes throughout the deep and subcortical white matter. No large vessel territory infarction. No mass lesion, hemorrhage, hydrocephalus or extra-axial collection. Major vessels at the base of the brain show flow. No pituitary mass. No fluid in the sinuses, middle ears or mastoids. No skull or skullbase lesion.  MRA HEAD FINDINGS  Both internal carotid arteries are widely patent into the brain. No siphon stenosis. The anterior and middle cerebral vessels are patent without proximal stenosis, aneurysm or vascular malformation. Both vertebral arteries are patent to the basilar. No basilar stenosis. Posterior circulation branch vessels are patent. No proximal vessel disease.  IMPRESSION: Sub cm acute infarction  affecting the subcortical white matter in the left frontal lobe at the vertex. No hemorrhage or swelling. No other acute infarction.  Moderate-to-marked chronic small vessel disease throughout the brain.  MR angiography of the large and medium size vessels is negative.   Electronically Signed  By: Nelson Chimes M.D.   On: 11/26/2013 08:51   Mr Brain Wo Contrast  11/26/2013   CLINICAL DATA:  Syncope.  Weakness.  EXAM: MRI HEAD WITHOUT CONTRAST  MRA HEAD WITHOUT CONTRAST  TECHNIQUE: Multiplanar, multiecho pulse sequences of the brain and surrounding structures were obtained without intravenous contrast. Angiographic images of the head were obtained using MRA technique without contrast.  COMPARISON:  None.  FINDINGS: MRI HEAD FINDINGS  There is a sub cm acute/subacute white matter infarction in the left frontal white matter towards the vertex. No other acute infarction. No cortical extension.  There chronic small-vessel changes affecting the pons. No focal cerebellar insult. The cerebral hemispheres show moderate to marked chronic small vessel changes throughout the deep and subcortical white matter. No large vessel territory infarction. No mass lesion, hemorrhage, hydrocephalus or extra-axial collection. Major vessels at the base of the brain show flow. No pituitary mass. No fluid in the sinuses, middle ears or mastoids. No skull or skullbase lesion.  MRA HEAD FINDINGS  Both internal carotid arteries are widely patent into the brain. No siphon stenosis. The anterior and middle cerebral vessels are patent without proximal stenosis, aneurysm or vascular malformation. Both vertebral arteries are patent to the basilar. No basilar stenosis. Posterior circulation branch vessels are patent. No proximal vessel disease.  IMPRESSION: Sub cm acute infarction affecting the subcortical white matter in the left frontal lobe at the vertex. No hemorrhage or swelling. No other acute infarction.  Moderate-to-marked chronic small  vessel disease throughout the brain.  MR angiography of the large and medium size vessels is negative.   Electronically Signed   By: Nelson Chimes M.D.   On: 11/26/2013 08:51   Ct Angio Abd/pel W/ And/or W/o  11/25/2013   CLINICAL DATA:  Low back pain for 1 week. Fell 1 week ago. Generalized weakness.  EXAM: CTA ABDOMEN AND PELVIS wITHOUT AND WITH CONTRAST  TECHNIQUE: Multidetector CT imaging of the abdomen and pelvis was performed using the standard protocol during bolus administration of intravenous contrast. Multiplanar reconstructed images and MIPs were obtained and reviewed to evaluate the vascular anatomy.  CONTRAST:  155mL OMNIPAQUE IOHEXOL 350 MG/ML SOLN  COMPARISON:  None.  FINDINGS: There is an infrarenal abdominal aortic aneurysm. In measures 7.6 cm in length by 4 cm x 3.8 cm transversely. There is significant mural thrombus along the right tenth posterior margin of the aneurysm. There is diffuse atherosclerotic plaque with calcifications from the descending thoracic aorta through the iliac arteries. No significant stenosis of the aorta or iliac arteries.  Plaque is noted at the origin of the celiac axis. The splenic artery has a separate origin from the aorta adjacent to the celiac axis. These vessels remain widely patent. Plaque is noted at the origin of the superior mesenteric artery with an approximately 50% narrowing. There is plaque at the origins of both renal arteries. There are dual renal arteries on the left. Right renal artery is narrowed by approximately 50% at its origin.  Lung bases show mild interstitial thickening. No edema or infiltrate. Heart is mildly enlarged.  Liver and spleen are unremarkable. Gallbladder not visualized. It is presumed to be surgically absent. The common bile duct is dilated, measuring 9 mm in the pancreatic head. No duct stone or mass is evident. This is presumed to be chronic. Pancreas is unremarkable. No adrenal masses.  Right renal cortical thinning. No renal  masses. There are extrarenal pelves bilaterally. No convincing hydronephrosis. The ureters are normal in course and  in caliber. Bladder is unremarkable.  Uterus is surgically absent.  No pelvic masses.  There are colonic diverticula. No diverticulitis. The colon is otherwise unremarkable. Small bowel is unremarkable. Appendix not visualized.  Mild compression fracture of L1. This is of unclear chronicity. Posterior aspect of the upper vertebral body mildly bulges into the spinal canal without significant stenosis. No other fractures. Degenerative changes are noted. Bones are diffusely demineralized.  Review of the MIP images confirms the above findings.  IMPRESSION: 1. Fracture of L1, which may be recent and account for the patient's Back pain. The fracture line is relatively well defined which would support a recent fracture. The chronicity of this could be further evaluated with lumbar spine MRI if desired clinically. 2. Infrarenal abdominal aortic aneurysm measuring 4 cm. No evidence of rupture. 3. Atherosclerotic changes throughout the abdominal aorta and along its branch vessels as detailed above. 4. Multiple other chronic findings as described. L1 fracture is the only evidence of an acute abnormality.   Electronically Signed   By: Lajean Manes M.D.   On: 11/25/2013 22:00    Scheduled Meds: . aspirin  300 mg Rectal Daily   Or  . aspirin  325 mg Oral Daily  . calcium carbonate  3 tablet Oral QAC supper  . heparin  5,000 Units Subcutaneous 3 times per day  . multivitamin with minerals  1 tablet Oral Daily  . omega-3 acid ethyl esters  1 g Oral BID  . potassium chloride  40 mEq Oral Once  . saccharomyces boulardii  250 mg Oral BID  . simvastatin  5 mg Oral q1800  . sodium chloride  3 mL Intravenous Q12H  . tamoxifen  20 mg Oral Daily  . tiotropium  18 mcg Inhalation Daily  . vancomycin  125 mg Oral TID AC & HS   Continuous Infusions:  Antibiotics Given (last 72 hours)   Date/Time Action  Medication Dose   11/26/13 0159 Given   vancomycin (VANCOCIN) 50 mg/mL oral solution 125 mg 125 mg   11/26/13 0750 Given   vancomycin (VANCOCIN) 50 mg/mL oral solution 125 mg 125 mg   11/26/13 1114 Given   vancomycin (VANCOCIN) 50 mg/mL oral solution 125 mg 125 mg   11/26/13 1600 Given   vancomycin (VANCOCIN) 50 mg/mL oral solution 125 mg 125 mg   11/26/13 2250 Given   vancomycin (VANCOCIN) 50 mg/mL oral solution 125 mg 125 mg   11/26/13 2250 Given   ciprofloxacin (CIPRO) tablet 500 mg 500 mg   11/27/13 0800 Given   vancomycin (VANCOCIN) 50 mg/mL oral solution 125 mg 125 mg   11/27/13 1105 Given   vancomycin (VANCOCIN) 50 mg/mL oral solution 125 mg 125 mg      Active Problems:   Syncope   CVA (cerebral infarction)   Protein-calorie malnutrition, severe    Time spent: 35 min    [facility] Pager [phone]. If 7PM-7AM, please contact night-coverage at [email or web address], password [facility] 11/27/2013, 11:35 AM  LOS: 2 days

## 2013-11-27 NOTE — Progress Notes (Signed)
Stroke Team Progress Note  HISTORY Ruth [name] is an 80 y.o. female who has been battling with C. Diff since April of this year. Lives in an assisted living facility with her husband. Yesterday 11/25/2013 she was going to take a shower and her husband aided her to the shower stall. Upon entering the shower she felt sick to her stomach and then was noted to slowly ease her was to the floor. Husband states she crawled to her bed where she slept for a few hours. Her daughter came to the house and family again tried to get her to shower. As she was in the shower she suddenly became unresponsive, eyes forced to the left, not responding to verbal or tactile stimulation, breathing very heavy and loud. This lasted for about 5 minutes. EMS was called. By the time they arrived she was able to follow commands but was talking nonsensical. Per family it took a good 15 minutes for her to return to her baseline. Currently she is back to her baseline. While in the hospital MRI was obtained showing a left frontal sub centimeter infarct.  Unable to determine specific Time last known well. Patient was not administered TPA secondary to delay in arrival. She was admitted for further evaluation and treatment.  SUBJECTIVE No family  is at the bedside.  Overall she feels her condition is stable. She has no clear memory of yesterday's events. No hx of passing out.   OBJECTIVE Most recent Vital Signs: Filed Vitals:   11/26/13 1444 11/26/13 1938 11/27/13 0552 11/27/13 0737  BP: 113/61 129/81 122/65   Pulse: 75 90 75   Temp: 98.5 F (36.9 C) 98.5 F (36.9 C) 97.5 F (36.4 C)   TempSrc: Oral Oral Oral   Resp: 16 15 18    Height:      Weight:      SpO2: 94%   97%   CBG (last 3)  No results found for this basename: GLUCAP,  in the last 72 hours  IV Fluid Intake:      MEDICATIONS  . aspirin  300 mg Rectal Daily   Or  . aspirin  325 mg Oral Daily  . calcium carbonate  3 tablet Oral QAC supper  . heparin  5,000 Units  Subcutaneous 3 times per day  . multivitamin with minerals  1 tablet Oral Daily  . omega-3 acid ethyl esters  1 g Oral BID  . potassium chloride  40 mEq Oral Once  . saccharomyces boulardii  250 mg Oral BID  . simvastatin  5 mg Oral q1800  . sodium chloride  3 mL Intravenous Q12H  . tamoxifen  20 mg Oral Daily  . tiotropium  18 mcg Inhalation Daily  . vancomycin  125 mg Oral TID AC & HS   PRN:  acetaminophen  Diet:  Cardiac thin liquids Activity:   DVT Prophylaxis:  Heparin 5000 units sq tid   CLINICALLY SIGNIFICANT STUDIES Basic Metabolic Panel:   Recent Labs Lab 11/26/13 0144 11/26/13 1745 11/27/13 0350  NA 140  --  141  K 4.6  --  3.2*  CL 102  --  109  CO2 23  --  23  GLUCOSE 82  --  124*  BUN 10  --  7  CREATININE 0.79  --  0.67  CALCIUM 8.2*  --  7.8*  MG  --  1.7  --   PHOS  --  2.3  --    Liver Function Tests:   Recent  Labs Lab 11/26/13 0144 11/27/13 0350  AST 20 20  ALT 12 9  ALKPHOS 51 42  BILITOT 0.4 0.3  PROT 6.3 5.4*  ALBUMIN 2.9* 2.3*   CBC:   Recent Labs Lab 11/26/13 0143 11/27/13 0350  WBC 11.9* 8.7  NEUTROABS 9.6* 6.4  HGB 12.7 10.9*  HCT 36.7 31.9*  MCV 94.8 94.9  PLT 320 288   Coagulation: No results found for this basename: LABPROT, INR,  in the last 168 hours Cardiac Enzymes:   Recent Labs Lab 11/25/13 1825 11/25/13 2028  TROPONINI <0.30 <0.30   Urinalysis:   Recent Labs Lab 11/25/13 2048  COLORURINE YELLOW  LABSPEC 1.023  PHURINE 6.0  GLUCOSEU NEGATIVE  HGBUR NEGATIVE  BILIRUBINUR NEGATIVE  KETONESUR 40*  PROTEINUR NEGATIVE  UROBILINOGEN 0.2  NITRITE NEGATIVE  LEUKOCYTESUR SMALL*   Lipid Panel    Component Value Date/Time   CHOL 85 11/27/2013 0330   TRIG 83 11/27/2013 0330   HDL 40 11/27/2013 0330   CHOLHDL 2.1 11/27/2013 0330   VLDL 17 11/27/2013 0330   LDLCALC 28 11/27/2013 0330   HgbA1C  No results found for this basename: HGBA1C    Urine Drug Screen:   No results found for this basename: labopia,   cocainscrnur,  labbenz,  amphetmu,  thcu,  labbarb    Alcohol Level: No results found for this basename: ETH,  in the last 168 hours   CT Angio Abd/pel W/ And/or W/o 11/25/2013   1. Fracture of L1, which may be recent and account for the patient's Back pain. The fracture line is relatively well defined which would support a recent fracture. The chronicity of this could be further evaluated with lumbar spine MRI if desired clinically. 2. Infrarenal abdominal aortic aneurysm measuring 4 cm. No evidence of rupture. 3. Atherosclerotic changes throughout the abdominal aorta and along its branch vessels as detailed above. 4. Multiple other chronic findings as described. L1 fracture is the only evidence of an acute abnormality.       CT of the brain    MRI of the brain  11/26/2013    Sub cm acute infarction affecting the subcortical white matter in the left frontal lobe at the vertex. No hemorrhage or swelling. No other acute infarction.  Moderate-to-marked chronic small vessel disease throughout the brain.    MRA of the brain  11/26/2013    MR angiography of the large and medium size vessels is negative.     2D Echocardiogram    Carotid Doppler  No evidence of hemodynamically significant internal carotid artery stenosis. Vertebral artery flow is antegrade.   CXR  11/25/2013    Borderline cardiomegaly, COPD without superimposed acute pulmonary process.   EKG  normal sinus rhythm. Probable left atrial enlargement. Borderline low voltage, extremity leads Consider right ventricular hypertrophy Repol abnrm suggests ischemia, diffuse leads ST elevation, consider anterolateral injury No significant change since last tracing For complete results please see formal report.   EEG  Therapy Recommendations   Physical Exam   Pleasant frail elderly Caucasian lady not in distress.Awake alert. Afebrile. Head is nontraumatic. Neck is supple without bruit. Hearing is normal. Cardiac exam no murmur or gallop. Lungs are  clear to auscultation. Distal pulses are well felt. Neurological Exam ;  Awake  Alert oriented x 2. Diminished attention, registration and recall. Follows one and few two-step commands only.. Normal speech and language.eye movements full without nystagmus.fundi were not visualized. Vision acuity and fields appear normal. Hearing is normal. Palatal movements are  normal. Face symmetric. Tongue midline. Normal strength, tone, reflexes and coordination. Normal sensation. Gait deferred. ASSESSMENT Ruth [name] is a 80 y.o. female presenting with loss of consciousness while in the shower with eyes forced to the left and bowel incontinence followed by a prolonged period of confusion (15 min). Imaging confirms a left corona radiata frontal lobe subcortical infarct.  Doubt stroke is the etiology of her symptoms but an incidental finding in the setting of syncope, ? Seizure. Given acute finding, full stroke workup is indicated. Infarct most likely thrombotic secondary to small vessel disease. On aspirin 81 mg orally every day prior to admission. Now on aspirin 325 mg orally every day for secondary stroke prevention. Patient with no resultant neuro symptoms. Stroke work up underway.  L1 compression fracture, thought to be secondary to fall, with back pain recurernt diarrhea secondary to c diff and sugar toxin producing e coli, on vanc and cipro, improving Dehydration with rehydration  Leukocytosis, wbc 11.9->8.7  Hgb 12.7->10.9  Cr 0.79->0.67 Abnormal UA, 21-50 WBC, sm leuko. Cx pending Hyperlipidemia, LDL 28, on pravachol 10 mg daily PTA, now on simvastatin 5 mg daily, goal LDL < 100  Hx ovarian/cervical cancer AAA Cigarette smoker, 1.5 ppd x 61 years etoh use  Hospital day # 2  TREATMENT/PLAN  Continue aspirin 325 mg orally every day for secondary stroke prevention.  F/u 2D echo, EEG results  OOB. Therapy evals  SIGNED Burnetta Sabin, MSN, RN, ANVP-BC, ANP-BC, GNP-BC [facility] Pager: [phone] 11/27/2013 9:52 AM   I have personally obtained a history, examined the patient, evaluated imaging results, and formulated the assessment and plan of care. I agree with the above.  Antony Contras, MD  To contact Stroke Continuity provider, please refer to [email or web address] After hours, contact General Neurology

## 2013-11-27 NOTE — Evaluation (Signed)
Occupational Therapy Evaluation Patient Details Name: Ruth [name] MRN: [id number] DOB: [birth year] Today's Date: 11/27/2013    History of Present Illness 80 yo admitted s/p fall in shower. MRI (+) Lt frontal lobe surbcortical infarct. Pt with syncopal event and workup for seizure currently underway.    Clinical Impression   Patient evaluated by Occupational Therapy with no further acute OT needs identified. All education has been completed and the patient has no further questions. See below for any follow-up Occupational Therapy or equipment needs. OT to sign off. Thank you for referral. Defer all further services to home health at this time.    Follow Up Recommendations  Home health OT (ALF hhot services for balance)    Equipment Recommendations  None recommended by OT    Recommendations for Other Services       Precautions / Restrictions Precautions Precautions: Fall      Mobility Bed Mobility Overal bed mobility: Modified Independent                Transfers Overall transfer level: Needs assistance   Transfers: Sit to/from Stand Sit to Stand: Supervision              Balance Overall balance assessment: History of Falls                                          ADL Overall ADL's : Needs assistance/impaired     Grooming: Wash/dry hands;Supervision/safety                   Toilet Transfer: Supervision/safety       Tub/ Banker: Tub transfer;Supervision/safety   Functional mobility during ADLs: Supervision/safety General ADL Comments: Pt is at adequate level for return to ALF. pt will have (A) of spouse. pt educated on back safety and use of log roll fo rbed mobility with good return.     Vision                     Perception     Praxis      Pertinent Vitals/Pain VSS     Hand Dominance Right   Extremity/Trunk Assessment Upper Extremity Assessment Upper Extremity Assessment: Overall WFL for tasks  assessed   Lower Extremity Assessment Lower Extremity Assessment: Defer to PT evaluation   Cervical / Trunk Assessment Cervical / Trunk Assessment: Normal   Communication Communication Communication: No difficulties   Cognition Arousal/Alertness: Awake/alert Behavior During Therapy: WFL for tasks assessed/performed Overall Cognitive Status: Impaired/Different from baseline Area of Impairment: Awareness           Awareness: Anticipatory   General Comments: pt was able to recall reason for admission, place    General Comments       Exercises       Shoulder Instructions      Home Living Family/patient expects to be discharged to:: Assisted living Living Arrangements: Spouse/significant other   Type of Home: Assisted living             Bathroom Shower/Tub: Teacher, early years/pre: Standard     Home Equipment: Environmental consultant - 2 wheels;Shower seat   Additional Comments: pt eats in the dining hall for lunch and dinner nightly  Lives With: Spouse    Prior Functioning/Environment Level of Independence: Independent             OT Diagnosis:  OT Problem List:     OT Treatment/Interventions:      OT Goals(Current goals can be found in the care plan section)    OT Frequency:     Barriers to D/C:            Co-evaluation              End of Session Nurse Communication: Mobility status;Precautions  Activity Tolerance: Patient tolerated treatment well Patient left: in bed;with call bell/phone within reach   Time: 1447-1456 OT Time Calculation (min): 9 min Charges:  OT General Charges $OT Visit: 1 Procedure OT Evaluation $Initial OT Evaluation Tier I: 1 Procedure G-Codes:    Peri Maris 09-Dec-2013, 3:32 PM Pager: [phone]

## 2013-11-28 DIAGNOSIS — E860 Dehydration: Secondary | ICD-10-CM

## 2013-11-28 LAB — COMPREHENSIVE METABOLIC PANEL
ALT: 11 U/L (ref 0–35)
AST: 19 U/L (ref 0–37)
Albumin: 2.6 g/dL — ABNORMAL LOW (ref 3.5–5.2)
Alkaline Phosphatase: 50 U/L (ref 39–117)
BUN: 7 mg/dL (ref 6–23)
CO2: 27 mEq/L (ref 19–32)
Calcium: 8.7 mg/dL (ref 8.4–10.5)
Chloride: 104 mEq/L (ref 96–112)
Creatinine, Ser: 0.75 mg/dL (ref 0.50–1.10)
GFR calc Af Amer: >90 mL/min (ref >90)
GFR calc non Af Amer: 79 mL/min — ABNORMAL LOW (ref >90)
Glucose, Bld: 88 mg/dL (ref 70–99)
Potassium: 4 mEq/L (ref 3.7–5.3)
Sodium: 140 mEq/L (ref 137–147)
Total Bilirubin: 0.4 mg/dL (ref 0.3–1.2)
Total Protein: 5.8 g/dL — ABNORMAL LOW (ref 6.0–8.3)

## 2013-11-28 LAB — CBC WITH DIFFERENTIAL/PLATELET
BASOS PCT: 1 % (ref 0–1)
Basophils Absolute: 0.1 10*3/uL (ref 0.0–0.1)
Eosinophils Absolute: 0.2 10*3/uL (ref 0.0–0.7)
Eosinophils Relative: 3 % (ref 0–5)
HEMATOCRIT: 34.6 % — AB (ref 36.0–46.0)
HEMOGLOBIN: 11.7 g/dL — AB (ref 12.0–15.0)
LYMPHS ABS: 1.6 10*3/uL (ref 0.7–4.0)
Lymphocytes Relative: 21 % (ref 12–46)
MCH: 32.1 pg (ref 26.0–34.0)
MCHC: 33.8 g/dL (ref 30.0–36.0)
MCV: 94.8 fL (ref 78.0–100.0)
MONO ABS: 0.6 10*3/uL (ref 0.1–1.0)
Monocytes Relative: 7 % (ref 3–12)
NEUTROS ABS: 5.2 10*3/uL (ref 1.7–7.7)
Neutrophils Relative %: 68 % (ref 43–77)
Platelets: 342 10*3/uL (ref 150–400)
RBC: 3.65 MIL/uL — AB (ref 3.87–5.11)
RDW: 15.2 % (ref 11.5–15.5)
WBC: 7.7 10*3/uL (ref 4.0–10.5)

## 2013-11-28 MED ORDER — ASPIRIN 325 MG PO TABS: 325.0000 mg | ORAL_TABLET | Freq: Every day | ORAL | Status: DC

## 2013-11-28 NOTE — Progress Notes (Signed)
Stroke Team Progress Note  HISTORY Ruth [name] is an 80 y.o. female who has been battling with C. Diff since April of this year. Lives in an assisted living facility with her husband. Yesterday 11/25/2013 she was going to take a shower and her husband aided her to the shower stall. Upon entering the shower she felt sick to her stomach and then was noted to slowly ease her was to the floor. Husband states she crawled to her bed where she slept for a few hours. Her daughter came to the house and family again tried to get her to shower. As she was in the shower she suddenly became unresponsive, eyes forced to the left, not responding to verbal or tactile stimulation, breathing very heavy and loud. This lasted for about 5 minutes. EMS was called. By the time they arrived she was able to follow commands but was talking nonsensical. Per family it took a good 15 minutes for her to return to her baseline. Currently she is back to her baseline. While in the hospital MRI was obtained showing a left frontal sub centimeter infarct.  Unable to determine specific Time last known well. Patient was not administered TPA secondary to delay in arrival. She was admitted for further evaluation and treatment.  SUBJECTIVE No acute events overnight.      OBJECTIVE Most recent Vital Signs: Filed Vitals:   11/27/13 0552 11/27/13 0737 11/27/13 1442 11/27/13 2104  BP: 122/65  140/63 144/64  Pulse: 75  73 72  Temp: 97.5 F (36.4 C)  97.7 F (36.5 C) 98.3 F (36.8 C)  TempSrc: Oral  Oral Oral  Resp: 18  16 17   Height:      Weight:      SpO2:  97% 97% 95%   CBG (last 3)  No results found for this basename: GLUCAP,  in the last 72 hours  IV Fluid Intake:      MEDICATIONS  . aspirin  300 mg Rectal Daily   Or  . aspirin  325 mg Oral Daily  . calcium carbonate  3 tablet Oral QAC supper  . heparin  5,000 Units Subcutaneous 3 times per day  . multivitamin with minerals  1 tablet Oral Daily  . omega-3 acid ethyl  esters  1 g Oral BID  . saccharomyces boulardii  250 mg Oral BID  . simvastatin  5 mg Oral q1800  . sodium chloride  3 mL Intravenous Q12H  . tamoxifen  20 mg Oral Daily  . tiotropium  18 mcg Inhalation Daily  . vancomycin  125 mg Oral TID AC & HS   PRN:  acetaminophen  Diet:  Cardiac thin liquids Activity: Home Health/24 H Assist  DVT Prophylaxis:  Heparin 5000 units sq tid   CLINICALLY SIGNIFICANT STUDIES Basic Metabolic Panel:   Recent Labs Lab 11/26/13 0144 11/26/13 1745 11/27/13 0350 11/28/13 0432  NA 140  --  141 140  K 4.6  --  3.2* 4.0  CL 102  --  109 104  CO2 23  --  23 27  GLUCOSE 82  --  124* 88  BUN 10  --  7 7  CREATININE 0.79  --  0.67 0.75  CALCIUM 8.2*  --  7.8* 8.7  MG  --  1.7  --   --   PHOS  --  2.3  --   --    Liver Function Tests:   Recent Labs Lab 11/27/13 0350 11/28/13 0432  AST 20 19  ALT 9 11  ALKPHOS 42 50  BILITOT 0.3 0.4  PROT 5.4* 5.8*  ALBUMIN 2.3* 2.6*   CBC:   Recent Labs Lab 11/27/13 0350 11/28/13 0432  WBC 8.7 7.7  NEUTROABS 6.4 5.2  HGB 10.9* 11.7*  HCT 31.9* 34.6*  MCV 94.9 94.8  PLT 288 342   Coagulation: No results found for this basename: LABPROT, INR,  in the last 168 hours Cardiac Enzymes:   Recent Labs Lab 11/25/13 1825 11/25/13 2028  TROPONINI <0.30 <0.30   Urinalysis:   Recent Labs Lab 11/25/13 2048  COLORURINE YELLOW  LABSPEC 1.023  PHURINE 6.0  GLUCOSEU NEGATIVE  HGBUR NEGATIVE  BILIRUBINUR NEGATIVE  KETONESUR 40*  PROTEINUR NEGATIVE  UROBILINOGEN 0.2  NITRITE NEGATIVE  LEUKOCYTESUR SMALL*   Lipid Panel    Component Value Date/Time   CHOL 85 11/27/2013 0330   TRIG 83 11/27/2013 0330   HDL 40 11/27/2013 0330   CHOLHDL 2.1 11/27/2013 0330   VLDL 17 11/27/2013 0330   LDLCALC 28 11/27/2013 0330   HgbA1C  Lab Results  Component Value Date   HGBA1C 5.7* 11/27/2013    Urine Drug Screen:   No results found for this basename: labopia,  cocainscrnur,  labbenz,  amphetmu,  thcu,  labbarb     Alcohol Level: No results found for this basename: ETH,  in the last 168 hours   CT Angio Abd/pel W/ And/or W/o 11/25/2013   1. Fracture of L1, which may be recent and account for the patient's Back pain. The fracture line is relatively well defined which would support a recent fracture. The chronicity of this could be further evaluated with lumbar spine MRI if desired clinically. 2. Infrarenal abdominal aortic aneurysm measuring 4 cm. No evidence of rupture. 3. Atherosclerotic changes throughout the abdominal aorta and along its branch vessels as detailed above. 4. Multiple other chronic findings as described. L1 fracture is the only evidence of an acute abnormality.     MRI of the brain  11/26/2013    Sub cm acute infarction affecting the subcortical white matter in the left frontal lobe at the vertex. No hemorrhage or swelling. No other acute infarction.  Moderate-to-marked chronic small vessel disease throughout the brain.    MRA of the brain  11/26/2013    MR angiography of the large and medium size vessels is negative.     Carotid Doppler  No evidence of hemodynamically significant internal carotid artery stenosis. Vertebral artery flow is antegrade.   CXR  11/25/2013    Borderline cardiomegaly, COPD without superimposed acute pulmonary process.   EKG  normal sinus rhythm. Probable left atrial enlargement. Borderline low voltage, extremity leads Consider right ventricular hypertrophy Repol abnrm suggests ischemia, diffuse leads ST elevation, consider anterolateral injury No significant change since last tracing For complete results please see formal report.   Echo pending  Therapy Recommendations Home Health/24 H Assist  Physical Exam   Awake alert. Afebrile. Head is nontraumatic. Neck is supple without bruit. Hearing is normal. Cardiac exam no murmur or gallop. Lungs are clear to auscultation. Distal pulses are well felt. Neurological Exam ;  Awake  Alert oriented x 2. Diminished recall and  attention Normal speech and language.eye movements full without nystagmus.fundi were not visualized. Vision acuity and fields appear normal. Hearing is normal. Palatal movements are normal. Face symmetric. Tongue midline. Normal strength, tone, reflexes and coordination. Normal sensation. Gait deferred. ASSESSMENT Ms. Ruth [name] is a 80 y.o. female presenting with loss of consciousness while in  the shower with eyes forced to the left and bowel incontinence followed by a prolonged period of confusion (15 min). Imaging confirms a left corona radiata frontal lobe subcortical infarct.  Doubt stroke is the etiology of her symptoms but an incidental finding in the setting of syncope, ? Seizure. Given acute finding, full stroke workup is indicated. Infarct most likely thrombotic secondary to small vessel disease. On aspirin 81 mg orally every day prior to admission. Now on aspirin 325 mg orally every day for secondary stroke prevention. Patient with no resultant neuro symptoms. Stroke work up underway.  L1 compression fracture, thought to be secondary to fall, with back pain recurernt diarrhea secondary to c diff and sugar toxin producing e coli, on vanc and cipro, improving Dehydration with rehydration  Leukocytosis, wbc 11.9->8.7  Hgb 12.7->10.9  Cr 0.79->0.67 Abnormal UA, 21-50 WBC, sm leuko.  Hyperlipidemia, LDL 28, on pravachol 10 mg daily PTA, now on simvastatin 5 mg daily, goal LDL < 100  Hx ovarian/cervical cancer AAA Cigarette smoker, 1.5 ppd x 61 years etoh use  Hospital day # 3  TREATMENT/PLAN  Continue aspirin 325 mg orally every day for secondary stroke prevention.  F/u 2D echo, EEG results  OOB. Therapy evals  No further needs from Stroke perspective, Stroke Team signing off. D/W Dr Sheran Fava   SIGNED Delbert Phenix, MSN, ANP-C, CNRN, [facility] Pager: [phone] 11/28/2013 10:39 AM   I have personally obtained a history, examined the patient, evaluated  imaging results, and formulated the assessment and plan of care. I agree with the above. Antony Contras, MD   To contact Stroke Continuity provider, please refer to [email or web address] After hours, contact General Neurology

## 2013-11-28 NOTE — Progress Notes (Signed)
CARE MANAGEMENT NOTE 11/28/2013  Patient:  Ruth [name], Ruth [name]   Account Number:  [id number]  Date Initiated:  11/28/2013  Documentation initiated by:  [facility]  Subjective/Objective Assessment:   Syncope  CVA (cerebral infarction)  Protein-calorie malnutrition, severe     Action/Plan:   lives at home with husband   Anticipated DC Date:  11/28/2013   Anticipated DC Plan:  [place]  CM consult      [facility] Choice  HOME HEALTH  Resumption Of Svcs/PTA Provider   Choice offered to / List presented to:  C-3 Spouse        HH arranged  HH-2 PT  HH-3 OT      [place]   Status of service:  Completed, signed off Medicare Important Message given?  YES (If response is "NO", the following Medicare IM given date fields will be blank) Date Medicare IM given:  11/25/2013 Date Additional Medicare IM given:  11/28/2013  Discharge Disposition:  [place]  Per UR Regulation:  Reviewed for med. necessity/level of care/duration of stay  If discussed at [place] of Stay Meetings, dates discussed:    Comments:  11/28/2013 1545 NCM spoke to pt and gave permission to speak with husband. States she is active with [place]. Notified [facility] of dc home with [facility] PT/OT. Pt has RW at home. Additional Medicare IM given, placed on chart. Jonnie Finner RN CCM Case Mgmt phone [phone]

## 2013-11-28 NOTE — Discharge Summary (Signed)
Physician Discharge Summary  JANSEN GOODPASTURE ZOX:[id number] DOB: [birth year] DOA: 11/25/2013  PCP: Marylene Land, MD  Admit date: 11/25/2013 Discharge date: 11/28/2013  Time spent: 35 minutes  Recommendations for Outpatient Follow-up:  1. vanc until 6/7 2. Home health  Discharge Diagnoses:  Active Problems:   Syncope   CVA (cerebral infarction)   Protein-calorie malnutrition, severe   Discharge Condition: improved  Diet recommendation: as tolerated due to weight loss  Filed Weights   11/26/13 0124  Weight: 40.143 kg (88 lb 8 oz)    History of present illness:  This is a 80 y.o. year old female with AAA, recurrent diarrhea secondary to C. difficile and sugar toxin producing Escherichia coli presented with generalized weakness and syncope. Patient noted to have been admitted May 22 226 for recurrent diarrhea secondary to C. difficile and Shiga toxin producing Escherichia coli. Patient was placed on by mouth vancomycin as well as Cipro for treatment. Patient states it diarrhea has improved since hospital discharge. Lives in an assisted living facility with her husband. However has had persistent weakness and had a reported episode of syncope earlier today and the patient was down for approximately 5 minutes. Per report, syncopal episode was witnessed by her husband the patient being unresponsive for an extended period of time and the patient regained consciousness and being persistently confused. Also had an episode of bowel incontinence. Patient denies any chest pain, shortness of breath, nausea, diaphoresis prior to onset. Feels like she has been persistently dry since hospital discharge. Feels slight weakness seems to be worse with standing.  Presented to ER today hemodynamically stable. Unable to perform orthostatics because of weakness per report. Initial WBC 13.6, K 6.4. K 4.1 on istat recheck. CBC pending. CXR w/ cardiomegaly and COPD. CT angio of abdomen with L 1 compression fracture,  stable 4cm infrarenal AAA.    Hospital Course:  Syncope- tele ok, echo shows EF 60-65%- no regional wall abnormalities, CE negative - suspect dehydration patient needs to be encouraged to eat and drink CVA- MRI +, echo below, carotids ok, FLP ok, HgbA1C 5.7, PT/OT eval - home health c diff- PO vanc with stop date of 11/30/13 L1 compression fracture: Likely secondary to fall in the setting of syncope. Continue with pain control. No focal neurological deficits or radicular symptoms  COPD: clinically stable. Continue spiriva  Hypokalemia- replete     Procedures:  Echo: see above  Consultations:  neuro  Discharge Exam: Filed Vitals:   11/28/13 1342  BP: 123/70  Pulse: 77  Temp: 98.3 F (36.8 C)  Resp: 16    General: A+Ox3, NAD Cardiovascular: rrr Respiratory: clear anterior  Discharge Instructions You were cared for by a hospitalist during your hospital stay. If you have any questions about your discharge medications or the care you received while you were in the hospital after you are discharged, you can call the unit and asked to speak with the hospitalist on call if the hospitalist that took care of you is not available. Once you are discharged, your primary care physician will handle any further medical issues. Please note that NO REFILLS for any discharge medications will be authorized once you are discharged, as it is imperative that you return to your primary care physician (or establish a relationship with a primary care physician if you do not have one) for your aftercare needs so that they can reassess your need for medications and monitor your lab values.     Medication List    ASK  your doctor about these medications       alendronate 70 MG tablet  Commonly known as:  FOSAMAX  Take 70 mg by mouth once a week. Take with a full glass of water on an empty stomach.     aspirin 81 MG tablet  Take 81 mg by mouth daily.     calcium carbonate 750 MG chewable tablet   Commonly known as:  TUMS EX  Chew 2 tablets by mouth daily.     ciprofloxacin 500 MG tablet  Commonly known as:  CIPRO  Take 1 tablet (500 mg total) by mouth 2 (two) times daily.     fish oil-omega-3 fatty acids 1000 MG capsule  Take 1 g by mouth 2 (two) times daily.     multivitamin tablet  Take 1 tablet by mouth daily.     pravastatin 10 MG tablet  Commonly known as:  PRAVACHOL  Take 10 mg by mouth daily.     saccharomyces boulardii 250 MG capsule  Commonly known as:  FLORASTOR  Take 1 capsule (250 mg total) by mouth 2 (two) times daily.     tamoxifen 20 MG tablet  Commonly known as:  NOLVADEX  Take 1 tablet (20 mg total) by mouth daily.     tiotropium 18 MCG inhalation capsule  Commonly known as:  SPIRIVA  Place 18 mcg into inhaler and inhale daily.     vancomycin 50 mg/mL oral solution  Commonly known as:  VANCOCIN  Take 2.5 mLs (125 mg total) by mouth every 6 (six) hours. Take till 11/29/13 and then stop       No Known Allergies     Follow-up Information   Follow up with Marylene Land, MD In 1 week.   Specialty:  Family Medicine   Contact information:   [place] [id number] [phone]        The results of significant diagnostics from this hospitalization (including imaging, microbiology, ancillary and laboratory) are listed below for reference.    Significant Diagnostic Studies: Dg Chest 2 View  11/25/2013   CLINICAL DATA:  Weakness, shortness of breath, diarrhea.  EXAM: CHEST  2 VIEW  COMPARISON:  Chest radiograph June 02, 2009.  FINDINGS: The cardiac silhouette is upper limits of normal in size, mildly calcified aortic mediastinal silhouette is nonsuspicious. Mild chronic interstitial changes with increased lung volumes. Lung fold in the right hemithorax, no pneumothorax.  Surgical clips project in the left chest wall. Osteopenia. Multiple EKG lines overlie the patient and may obscure subtle underlying pathology.  IMPRESSION:  Borderline cardiomegaly, COPD without superimposed acute pulmonary process.   Electronically Signed   By: Elon Alas   On: 11/25/2013 22:27   Mr Jodene Nam Head Wo Contrast  11/26/2013   CLINICAL DATA:  Syncope.  Weakness.  EXAM: MRI HEAD WITHOUT CONTRAST  MRA HEAD WITHOUT CONTRAST  TECHNIQUE: Multiplanar, multiecho pulse sequences of the brain and surrounding structures were obtained without intravenous contrast. Angiographic images of the head were obtained using MRA technique without contrast.  COMPARISON:  None.  FINDINGS: MRI HEAD FINDINGS  There is a sub cm acute/subacute white matter infarction in the left frontal white matter towards the vertex. No other acute infarction. No cortical extension.  There chronic small-vessel changes affecting the pons. No focal cerebellar insult. The cerebral hemispheres show moderate to marked chronic small vessel changes throughout the deep and subcortical white matter. No large vessel territory infarction. No mass lesion, hemorrhage, hydrocephalus or extra-axial collection. Major  vessels at the base of the brain show flow. No pituitary mass. No fluid in the sinuses, middle ears or mastoids. No skull or skullbase lesion.  MRA HEAD FINDINGS  Both internal carotid arteries are widely patent into the brain. No siphon stenosis. The anterior and middle cerebral vessels are patent without proximal stenosis, aneurysm or vascular malformation. Both vertebral arteries are patent to the basilar. No basilar stenosis. Posterior circulation branch vessels are patent. No proximal vessel disease.  IMPRESSION: Sub cm acute infarction affecting the subcortical white matter in the left frontal lobe at the vertex. No hemorrhage or swelling. No other acute infarction.  Moderate-to-marked chronic small vessel disease throughout the brain.  MR angiography of the large and medium size vessels is negative.   Electronically Signed   By: Nelson Chimes M.D.   On: 11/26/2013 08:51   Mr Brain Wo  Contrast  11/26/2013   CLINICAL DATA:  Syncope.  Weakness.  EXAM: MRI HEAD WITHOUT CONTRAST  MRA HEAD WITHOUT CONTRAST  TECHNIQUE: Multiplanar, multiecho pulse sequences of the brain and surrounding structures were obtained without intravenous contrast. Angiographic images of the head were obtained using MRA technique without contrast.  COMPARISON:  None.  FINDINGS: MRI HEAD FINDINGS  There is a sub cm acute/subacute white matter infarction in the left frontal white matter towards the vertex. No other acute infarction. No cortical extension.  There chronic small-vessel changes affecting the pons. No focal cerebellar insult. The cerebral hemispheres show moderate to marked chronic small vessel changes throughout the deep and subcortical white matter. No large vessel territory infarction. No mass lesion, hemorrhage, hydrocephalus or extra-axial collection. Major vessels at the base of the brain show flow. No pituitary mass. No fluid in the sinuses, middle ears or mastoids. No skull or skullbase lesion.  MRA HEAD FINDINGS  Both internal carotid arteries are widely patent into the brain. No siphon stenosis. The anterior and middle cerebral vessels are patent without proximal stenosis, aneurysm or vascular malformation. Both vertebral arteries are patent to the basilar. No basilar stenosis. Posterior circulation branch vessels are patent. No proximal vessel disease.  IMPRESSION: Sub cm acute infarction affecting the subcortical white matter in the left frontal lobe at the vertex. No hemorrhage or swelling. No other acute infarction.  Moderate-to-marked chronic small vessel disease throughout the brain.  MR angiography of the large and medium size vessels is negative.   Electronically Signed   By: Nelson Chimes M.D.   On: 11/26/2013 08:51   Ct Angio Abd/pel W/ And/or W/o  11/25/2013   CLINICAL DATA:  Low back pain for 1 week. Fell 1 week ago. Generalized weakness.  EXAM: CTA ABDOMEN AND PELVIS wITHOUT AND WITH CONTRAST   TECHNIQUE: Multidetector CT imaging of the abdomen and pelvis was performed using the standard protocol during bolus administration of intravenous contrast. Multiplanar reconstructed images and MIPs were obtained and reviewed to evaluate the vascular anatomy.  CONTRAST:  114mL OMNIPAQUE IOHEXOL 350 MG/ML SOLN  COMPARISON:  None.  FINDINGS: There is an infrarenal abdominal aortic aneurysm. In measures 7.6 cm in length by 4 cm x 3.8 cm transversely. There is significant mural thrombus along the right tenth posterior margin of the aneurysm. There is diffuse atherosclerotic plaque with calcifications from the descending thoracic aorta through the iliac arteries. No significant stenosis of the aorta or iliac arteries.  Plaque is noted at the origin of the celiac axis. The splenic artery has a separate origin from the aorta adjacent to the celiac axis. These vessels  remain widely patent. Plaque is noted at the origin of the superior mesenteric artery with an approximately 50% narrowing. There is plaque at the origins of both renal arteries. There are dual renal arteries on the left. Right renal artery is narrowed by approximately 50% at its origin.  Lung bases show mild interstitial thickening. No edema or infiltrate. Heart is mildly enlarged.  Liver and spleen are unremarkable. Gallbladder not visualized. It is presumed to be surgically absent. The common bile duct is dilated, measuring 9 mm in the pancreatic head. No duct stone or mass is evident. This is presumed to be chronic. Pancreas is unremarkable. No adrenal masses.  Right renal cortical thinning. No renal masses. There are extrarenal pelves bilaterally. No convincing hydronephrosis. The ureters are normal in course and in caliber. Bladder is unremarkable.  Uterus is surgically absent.  No pelvic masses.  There are colonic diverticula. No diverticulitis. The colon is otherwise unremarkable. Small bowel is unremarkable. Appendix not visualized.  Mild compression  fracture of L1. This is of unclear chronicity. Posterior aspect of the upper vertebral body mildly bulges into the spinal canal without significant stenosis. No other fractures. Degenerative changes are noted. Bones are diffusely demineralized.  Review of the MIP images confirms the above findings.  IMPRESSION: 1. Fracture of L1, which may be recent and account for the patient's Back pain. The fracture line is relatively well defined which would support a recent fracture. The chronicity of this could be further evaluated with lumbar spine MRI if desired clinically. 2. Infrarenal abdominal aortic aneurysm measuring 4 cm. No evidence of rupture. 3. Atherosclerotic changes throughout the abdominal aorta and along its branch vessels as detailed above. 4. Multiple other chronic findings as described. L1 fracture is the only evidence of an acute abnormality.   Electronically Signed   By: Lajean Manes M.D.   On: 11/25/2013 22:00    Microbiology: Recent Results (from the past 240 hour(s))  CULTURE, BLOOD (ROUTINE X 2)     Status: None   Collection Time    11/26/13  1:40 AM      Result Value Ref Range Status   Specimen Description BLOOD RIGHT ARM   Final   Special Requests BOTTLES DRAWN AEROBIC ONLY 10CC   Final   Culture  Setup Time     Final   Value: 11/26/2013 08:41     Performed at [facility]   Culture     Final   Value:        BLOOD CULTURE RECEIVED NO GROWTH TO DATE CULTURE WILL BE HELD FOR 5 DAYS BEFORE ISSUING A FINAL NEGATIVE REPORT     Performed at [facility]   Report Status PENDING   Incomplete  CULTURE, BLOOD (ROUTINE X 2)     Status: None   Collection Time    11/26/13  1:45 AM      Result Value Ref Range Status   Specimen Description BLOOD RIGHT ARM   Final   Special Requests BOTTLES DRAWN AEROBIC ONLY 10CC   Final   Culture  Setup Time     Final   Value: 11/26/2013 08:41     Performed at [facility]   Culture     Final   Value:        BLOOD CULTURE  RECEIVED NO GROWTH TO DATE CULTURE WILL BE HELD FOR 5 DAYS BEFORE ISSUING A FINAL NEGATIVE REPORT     Performed at [facility]   Report Status PENDING  Incomplete  URINE CULTURE     Status: None   Collection Time    11/26/13  4:51 AM      Result Value Ref Range Status   Specimen Description URINE, CLEAN CATCH   Final   Special Requests NONE   Final   Culture  Setup Time     Final   Value: 11/26/2013 12:25     Performed at [facility] Count     Final   Value: 75,000 COLONIES/ML     Performed at [facility]   Culture     Final   Value: Multiple bacterial morphotypes present, none predominant. Suggest appropriate recollection if clinically indicated.     Performed at [facility]   Report Status 11/27/2013 FINAL   Final     Labs: Basic Metabolic Panel:  Recent Labs Lab 11/25/13 1825 11/25/13 2035 11/26/13 0144 11/26/13 1745 11/27/13 0350 11/28/13 0432  NA 134* 143 140  --  141 140  K 6.4* 4.1 4.6  --  3.2* 4.0  CL 98 99 102  --  109 104  CO2 22  --  23  --  23 27  GLUCOSE 111* 94 82  --  124* 88  BUN 13 12 10   --  7 7  CREATININE 0.97 1.10 0.79  --  0.67 0.75  CALCIUM 8.7  --  8.2*  --  7.8* 8.7  MG  --   --   --  1.7  --   --   PHOS  --   --   --  2.3  --   --    Liver Function Tests:  Recent Labs Lab 11/26/13 0144 11/27/13 0350 11/28/13 0432  AST 20 20 19   ALT 12 9 11   ALKPHOS 51 42 50  BILITOT 0.4 0.3 0.4  PROT 6.3 5.4* 5.8*  ALBUMIN 2.9* 2.3* 2.6*   No results found for this basename: LIPASE, AMYLASE,  in the last 168 hours No results found for this basename: AMMONIA,  in the last 168 hours CBC:  Recent Labs Lab 11/25/13 1825 11/25/13 2035 11/26/13 0143 11/27/13 0350 11/28/13 0432  WBC 13.6*  --  11.9* 8.7 7.7  NEUTROABS  --   --  9.6* 6.4 5.2  HGB 13.4 14.6 12.7 10.9* 11.7*  HCT 39.3 43.0 36.7 31.9* 34.6*  MCV 94.9  --  94.8 94.9 94.8  PLT 393  --  320 288 342   Cardiac Enzymes:  Recent  Labs Lab 11/25/13 1825 11/25/13 2028  TROPONINI <0.30 <0.30   BNP: BNP (last 3 results) No results found for this basename: PROBNP,  in the last 8760 hours CBG: No results found for this basename: GLUCAP,  in the last 168 hours     Signed:  Geradine Girt  [facility] 11/28/2013, 2:12 PM

## 2013-11-28 NOTE — Progress Notes (Signed)
Physical Therapy Treatment Patient Details Name: Ruth [name] MRN: [id number] DOB: 03/29/1933 Today's Date: 12-25-2013    History of Present Illness 80 yo admitted s/p fall in shower. MRI (+) Lt frontal lobe surbcortical infarct. CT abdomen showed L1 compression fracture. Pt with syncopal event and workup for seizure currently underway. PMHx- recent cdiff illness (ongoing since 09/29/13) with dehydration, weight loss, HOH, osteoporosis, AAA    PT Comments    Pt progressing well.  Follow Up Recommendations  Home health PT;Supervision - Intermittent     Equipment Recommendations  None recommended by PT    Recommendations for Other Services       Precautions / Restrictions Precautions Precautions: Fall    Mobility  Bed Mobility Overal bed mobility: Modified Independent                Transfers Overall transfer level: Modified independent Equipment used: Rolling walker (2 wheeled) Transfers: Sit to/from Stand Sit to Stand: Modified independent (Device/Increase time)            Ambulation/Gait Ambulation/Gait assistance: Supervision Ambulation Distance (Feet): 225 Feet Assistive device: Rolling walker (2 wheeled) Gait Pattern/deviations: Step-through pattern;Decreased stride length     General Gait Details: 1 cue to stand more erect   Stairs            Wheelchair Mobility    Modified Rankin (Stroke Patients Only)       Balance Overall balance assessment: Needs assistance;History of Falls Sitting-balance support: No upper extremity supported Sitting balance-Leahy Scale: Fair                              Cognition Arousal/Alertness: Awake/alert Behavior During Therapy: WFL for tasks assessed/performed Overall Cognitive Status: Within Functional Limits for tasks assessed                      Exercises      General Comments        Pertinent Vitals/Pain Pt reports back pain. Repositioned.    Home Living                      Prior Function            PT Goals (current goals can now be found in the care plan section) Progress towards PT goals: Goals met and updated - see care plan    Frequency  Min 3X/week    PT Plan Current plan remains appropriate    Co-evaluation             End of Session Equipment Utilized During Treatment: Gait belt Activity Tolerance: Patient tolerated treatment well Patient left: in bed;with call bell/phone within reach;with bed alarm set     Time: 2563-8937 PT Time Calculation (min): 10 min  Charges:  $Gait Training: 8-22 mins                    G Codes:      [employer] 25-Dec-2013, 12:21 PM  [employer] PT [phone]

## 2013-11-28 NOTE — ED Provider Notes (Signed)
Medical screening examination/treatment/procedure(s) were conducted as a shared visit with non-physician practitioner(s) and myself.  I personally evaluated the patient during the encounter.   EKG Interpretation   Date/Time:  Tuesday November 25 2013 18:25:14 EDT Ventricular Rate:  68 PR Interval:  144 QRS Duration: 85 QT Interval:  401 QTC Calculation: 426 R Axis:   84 Text Interpretation:  Sinus rhythm Probable left atrial enlargement  Borderline low voltage, extremity leads Consider right ventricular  hypertrophy Repol abnrm suggests ischemia, diffuse leads ST elevation,  consider anterolateral injury No significant change since last tracing  Confirmed by Zenia Resides  MD, Takari Duncombe ([id number]) on 11/25/2013 11:54:56 PM       Leota Jacobsen, MD 11/28/13 [phone]

## 2013-12-02 LAB — CULTURE, BLOOD (ROUTINE X 2)
CULTURE: NO GROWTH
Culture: NO GROWTH

## 2014-03-16 ENCOUNTER — Encounter: Payer: Self-pay | Admitting: Vascular Surgery

## 2014-03-17 ENCOUNTER — Encounter: Payer: 59 | Admitting: Vascular Surgery

## 2014-03-30 ENCOUNTER — Encounter: Payer: Self-pay | Admitting: Vascular Surgery

## 2014-03-31 ENCOUNTER — Encounter: Payer: 59 | Admitting: Vascular Surgery

## 2014-03-31 ENCOUNTER — Encounter: Payer: Self-pay | Admitting: Vascular Surgery

## 2014-03-31 ENCOUNTER — Ambulatory Visit (INDEPENDENT_AMBULATORY_CARE_PROVIDER_SITE_OTHER): Payer: Medicare Other | Admitting: Vascular Surgery

## 2014-03-31 VITALS — BP 117/70 | HR 96 | Ht 61.0 in | Wt 87.7 lb

## 2014-03-31 DIAGNOSIS — I639 Cerebral infarction, unspecified: Secondary | ICD-10-CM

## 2014-03-31 DIAGNOSIS — I714 Abdominal aortic aneurysm, without rupture, unspecified: Secondary | ICD-10-CM | POA: Insufficient documentation

## 2014-03-31 NOTE — Progress Notes (Addendum)
Referred by: Marylene Land, MD [address] [place], [place] [address]  Reason for referral: AAA  History of Present Illness  The patient is a 80 y.o. ([birth year]) female who presents with chief complaint: AAA 4.0 cm.   The patient does not have back or abdominal pain with palpation.  The patient does not history of embolic episodes from the AAA.  The patient's risk factors for AAA included: Smoking since age 74 and hyperlipidemia treated with pravastatin.   Her family history includes hypertension, CAD and DM. The patient  Smokes 1 pk/day cigarettes.  Past Medical History  Diagnosis Date  . Osteoporosis, unspecified 04/01/2013  . Hyperlipidemia 04/01/2013  . Hearing loss 04/01/2013  . Cancer     cervical / ovarian   . AAA (abdominal aortic aneurysm)   . COPD (chronic obstructive pulmonary disease)   . Stroke     Past Surgical History  Procedure Laterality Date  . Abdominal hysterectomy    . Cholecystectomy      History   Social History  . Marital Status: Married    Spouse Name: N/A    Number of Children: N/A  . Years of Education: N/A   Occupational History  . Not on file.   Social History Main Topics  . Smoking status: Current Every Day Smoker -- 1.50 packs/day for 61 years    Types: Cigarettes  . Smokeless tobacco: Never Used  . Alcohol Use: Yes     Comment: rarely  . Drug Use: No  . Sexual Activity: No   Other Topics Concern  . Not on file   Social History Narrative  . No narrative on file    Family History  Problem Relation Age of Onset  . Hypertension Mother   . Hypertension Father   . Diabetes Father   . Heart disease Father   . Deep vein thrombosis Sister   . Diabetes Daughter   . Hyperlipidemia Daughter   . Diabetes Son   . Hyperlipidemia Son     Current Outpatient Prescriptions on File Prior to Visit  Medication Sig Dispense Refill  . aspirin 325 MG tablet Take 1 tablet (325 mg total) by mouth daily.      . calcium carbonate  (TUMS EX) 750 MG chewable tablet Chew 2 tablets by mouth daily.      . fish oil-omega-3 fatty acids 1000 MG capsule Take 1 g by mouth 2 (two) times daily.      . Multiple Vitamin (MULTIVITAMIN) tablet Take 1 tablet by mouth daily.      . pravastatin (PRAVACHOL) 10 MG tablet Take 10 mg by mouth daily.      . tamoxifen (NOLVADEX) 20 MG tablet Take 1 tablet (20 mg total) by mouth daily.  90 tablet  2  . tiotropium (SPIRIVA) 18 MCG inhalation capsule Place 18 mcg into inhaler and inhale daily.      Marland Kitchen alendronate (FOSAMAX) 70 MG tablet Take 70 mg by mouth once a week. Take with a full glass of water on an empty stomach.      . saccharomyces boulardii (FLORASTOR) 250 MG capsule Take 1 capsule (250 mg total) by mouth 2 (two) times daily.  60 capsule  0  . vancomycin (VANCOCIN) 50 mg/mL oral solution Take 2.5 mLs (125 mg total) by mouth every 6 (six) hours. Take till 11/29/13 and then stop  110 mL  0   No current facility-administered medications on file prior to visit.    No  Known Allergies    REVIEW OF SYSTEMS:  (Positives checked otherwise negative)  CARDIOVASCULAR:  []  chest pain, []  chest pressure, []  palpitations, []  shortness of breath when laying flat, []  shortness of breath with exertion,  []  pain in feet when walking, []  pain in feet when laying flat, []  history of blood clot in veins (DVT), []  history of phlebitis, []  swelling in legs, []  varicose veins  PULMONARY:  [x]  productive cough, []  asthma, []  wheezing  NEUROLOGIC:  []  weakness in arms or legs, []  numbness in arms or legs, []  difficulty speaking or slurred speech, []  temporary loss of vision in one eye, []  dizziness  HEMATOLOGIC:  []  bleeding problems, []  problems with blood clotting too easily  MUSCULOSKEL:  []  joint pain, []  joint swelling  GASTROINTEST:  []  vomiting blood, []  blood in stool     GENITOURINARY:  [x]  burning with urination, []  blood in urine  PSYCHIATRIC:  []  history of major depression  INTEGUMENTARY:  []   rashes, []  ulcers  CONSTITUTIONAL:  []  fever, []  chills  For VQI Use Only    PRE-ADM LIVING: Home  AMB STATUS: Ambulatory  CAD Sx: None  PRIOR CHF: None  STRESS TEST: [x ] No, [ ]  Normal, [ ]  + ischemia, [ ]  + MI, [ ]  Both    Physical Examination  Filed Vitals:   03/31/14 1517  BP: 117/70  Pulse: 96  Height: 5\' 1"  (1.549 m)  Weight: 87 lb 11.2 oz (39.78 kg)  SpO2: 97%   Body mass index is 16.58 kg/(m^2).  General: A&O x 3, WDWN thin  Head: [place]/AT,  Ear/Nose/Throat: Hearing grossly intact, nares w/o erythema or drainage, oropharynx w/o Erythema/Exudate, Hearing loss, Nasal drainage, Dental caries, OP w/ erythema / exudate  Eyes: PERRLA  Neck: Supple, no nuchal rigidity  Pulmonary: Sym exp, good air movt, CTAB, no rales, rhonchi, & wheezing  Cardiac: RRR Vascular: Vessel Right Left  Radial Palpable Palpable  Brachial Palpable Palpable  Carotid Palpable, without bruit Palpable, without bruit  Aorta  palpable N/A  Femoral Palpable Palpable  Popliteal  palpable  palpable  PT Palpable Palpable  DP Palpable Palpable   Gastrointestinal: soft, NTND  Musculoskeletal: M/S 5/5 throughout  Extremities without ischemic changes   Neurologic: CN 2-12 intact   Psychiatric: Judgment intact, Mood & affect appropriate for pt's clinical situation  Dermatologic: See M/S exam for extremity exam, no rashes ,  Lymph : No Cervical, Axillary, or Inguinal lymphadenopathy   Non-Invasive Vascular Imaging  AAA Duplex (Date: 11/25/2013)  Current size:  7.6 cm  in length by 4 cm x 3.8 cm transversely.     Medical Decision Making  The patient is a 80 y.o. female who presents with: 4.0 cm AAA.   Based on this patient's exam and diagnostic studies, he needs .  The threshold for repair is AAA size > 5.5 cm, growth > 1 cm/yr, and symptomatic status.  The patient will follow up in 6 months for AAA duplex  Dr. Donnetta Hutching emphasized the importance of maximal medical management  including strict control of blood pressure, blood glucose, and lipid levels, antiplatelet agents, obtaining regular exercise, and cessation of smoking.   The patient was seen in conjunction with Dr. Donnetta Hutching today   [facility] Office: [phone]   03/31/2014, 3:47 PM    I have examined the patient, reviewed and agree with above. The patient is well-known to me from my prior to visit with her husband. Had a long  discussion regarding her 4 cm aneurysm. The patient is quite small. Her aneurysm is easily palpable but is nontender. We'll see her in 6 months for continued followup. Discussed symptoms of leaking aneurysm which she knows to report immediately to return should this occur. Explained that this is extremely unlikely with a size of her aneurysm  EARLY, TODD, MD 03/31/2014 5:07 PM

## 2014-04-01 ENCOUNTER — Other Ambulatory Visit: Payer: Self-pay | Admitting: *Deleted

## 2014-04-01 DIAGNOSIS — I714 Abdominal aortic aneurysm, without rupture, unspecified: Secondary | ICD-10-CM

## 2014-04-03 ENCOUNTER — Telehealth: Payer: Self-pay | Admitting: Oncology

## 2014-04-03 NOTE — Telephone Encounter (Signed)
per GM move appt to HF-cld pt to adv of new appt time-spoke to pt spouse Roy-understood appt time

## 2014-04-07 ENCOUNTER — Encounter: Payer: 59 | Admitting: Vascular Surgery

## 2014-04-13 ENCOUNTER — Other Ambulatory Visit: Payer: Self-pay | Admitting: *Deleted

## 2014-04-13 DIAGNOSIS — C50919 Malignant neoplasm of unspecified site of unspecified female breast: Secondary | ICD-10-CM

## 2014-04-14 ENCOUNTER — Other Ambulatory Visit (HOSPITAL_BASED_OUTPATIENT_CLINIC_OR_DEPARTMENT_OTHER): Payer: Medicare Other

## 2014-04-14 DIAGNOSIS — C50919 Malignant neoplasm of unspecified site of unspecified female breast: Secondary | ICD-10-CM

## 2014-04-14 DIAGNOSIS — C50912 Malignant neoplasm of unspecified site of left female breast: Secondary | ICD-10-CM

## 2014-04-14 LAB — CBC WITH DIFFERENTIAL/PLATELET
BASO%: 0.5 % (ref 0.0–2.0)
Basophils Absolute: 0.1 10*3/uL (ref 0.0–0.1)
EOS%: 0.6 % (ref 0.0–7.0)
Eosinophils Absolute: 0.1 10*3/uL (ref 0.0–0.5)
HEMATOCRIT: 43.8 % (ref 34.8–46.6)
HGB: 13.9 g/dL (ref 11.6–15.9)
LYMPH%: 10.6 % — ABNORMAL LOW (ref 14.0–49.7)
MCH: 31.2 pg (ref 25.1–34.0)
MCHC: 31.7 g/dL (ref 31.5–36.0)
MCV: 98.2 fL (ref 79.5–101.0)
MONO#: 0.5 10*3/uL (ref 0.1–0.9)
MONO%: 3.2 % (ref 0.0–14.0)
NEUT#: 11.9 10*3/uL — ABNORMAL HIGH (ref 1.5–6.5)
NEUT%: 85.1 % — ABNORMAL HIGH (ref 38.4–76.8)
Platelets: 224 10*3/uL (ref 145–400)
RBC: 4.46 10*6/uL (ref 3.70–5.45)
RDW: 14.5 % (ref 11.2–14.5)
WBC: 14 10*3/uL — ABNORMAL HIGH (ref 3.9–10.3)
lymph#: 1.5 10*3/uL (ref 0.9–3.3)

## 2014-04-14 LAB — COMPREHENSIVE METABOLIC PANEL (CC13)
ALT: 10 U/L (ref 0–55)
AST: 16 U/L (ref 5–34)
Albumin: 3.1 g/dL — ABNORMAL LOW (ref 3.5–5.0)
Alkaline Phosphatase: 47 U/L (ref 40–150)
Anion Gap: 8 mEq/L (ref 3–11)
BILIRUBIN TOTAL: 0.32 mg/dL (ref 0.20–1.20)
BUN: 24.1 mg/dL (ref 7.0–26.0)
CALCIUM: 9 mg/dL (ref 8.4–10.4)
CHLORIDE: 106 meq/L (ref 98–109)
CO2: 26 mEq/L (ref 22–29)
CREATININE: 1.5 mg/dL — AB (ref 0.6–1.1)
Glucose: 170 mg/dl — ABNORMAL HIGH (ref 70–140)
Potassium: 4.3 mEq/L (ref 3.5–5.1)
Sodium: 140 mEq/L (ref 136–145)
Total Protein: 6.7 g/dL (ref 6.4–8.3)

## 2014-04-21 ENCOUNTER — Ambulatory Visit: Payer: Medicare Other | Admitting: Oncology

## 2014-04-21 ENCOUNTER — Ambulatory Visit (HOSPITAL_BASED_OUTPATIENT_CLINIC_OR_DEPARTMENT_OTHER): Payer: Medicare Other | Admitting: Nurse Practitioner

## 2014-04-21 ENCOUNTER — Telehealth: Payer: Self-pay | Admitting: Oncology

## 2014-04-21 ENCOUNTER — Encounter: Payer: Self-pay | Admitting: Nurse Practitioner

## 2014-04-21 VITALS — BP 137/73 | HR 98 | Temp 97.6°F | Resp 18 | Ht 61.0 in | Wt 88.1 lb

## 2014-04-21 DIAGNOSIS — F17200 Nicotine dependence, unspecified, uncomplicated: Secondary | ICD-10-CM

## 2014-04-21 DIAGNOSIS — M810 Age-related osteoporosis without current pathological fracture: Secondary | ICD-10-CM

## 2014-04-21 DIAGNOSIS — N390 Urinary tract infection, site not specified: Secondary | ICD-10-CM

## 2014-04-21 DIAGNOSIS — Z170 Estrogen receptor positive status [ER+]: Secondary | ICD-10-CM

## 2014-04-21 DIAGNOSIS — C50912 Malignant neoplasm of unspecified site of left female breast: Secondary | ICD-10-CM

## 2014-04-21 DIAGNOSIS — C50112 Malignant neoplasm of central portion of left female breast: Secondary | ICD-10-CM

## 2014-04-21 NOTE — Telephone Encounter (Signed)
, °

## 2014-04-21 NOTE — Progress Notes (Signed)
ID: Ruth [name]   DOB: 10/04/1932  MR#: [id number]  UPJ#:[id number]  PCP: Marylene Land, MD GYN:  SU: Erroll Luna MD OTHER MD: Franchot Gallo, MD   CHIEF COMPLAINT:  Left Breast Cancer   HISTORY OF PRESENT ILLNESS: The patient had a screening mammogram at [facility] on 10-17-06 which showed some asymmetry in the left breast.  Additional views were obtained on 10-25-06 and showed approximately a 10 mm. nodular density in the retroareolar region of the left breast.  Ultrasound was obtained and showed it to be a hypoechoic mass measuring 10.8 mm.  Biopsy was obtained the same day and showed ([id number]) invasive lobular breast cancer which was 97% estrogen receptor positive, 0% progesterone receptor with a proliferation marker of 17% and HER-2/neu negative at 0.    With this information, the patient was referred to Dr. Brantley Stage and on 10-31-06 bilateral breast MRI's were obtained at [facility].  This showed a bilobed 8 mm. spiculated nodule in the left breast with no evidence of axillary involvement and nothing of importance in the right breast.    With this information, after appropriate discussion, the patient proceeded to left lumpectomy and sentinel lymph node biopsy under Dr. Brantley Stage on 11-23-06.  The final pathology ([id number]) confirmed an invasive lobular carcinoma measuring 1.5 cm., grade 2, with no evidence of lymphovascular invasion, adequate margins and 0 of 3 sentinel lymph nodes involved. Her subsequent history is as detailed below.  INTERVAL HISTORY: Suhailah returns today for follow up of her breast cancer, accompanied by her husband [name]. She has been on tamoxifen since July 2008 and is tolerating it well with few complaints. She denies hot flashes and only has minimal vaginal wetness. The interval history is remarkable for a stable 4cm abdominal aortic aneurism. The patient states that this will not require surgery at this time. She was also  hospitalized for a UTI this summer and she developed c.diff as a result of that admission. She continues to have bladder leakage issues, but has an appointment with her urologist this afternoon.   REVIEW OF SYSTEMS: Gayle denies fevers, chills, nausea, or vomiting. Her bowel movements are regular now, with infrequent constipation. She continues to smoke 1 pack of cigarettes per day and as a result coughs up clear phlegm occasionally. She is short of breath with activity, but denies chest pain, or palpitations. She denies headaches or dizziness. A detailed review of systems is otherwise noncontributory.    PAST MEDICAL HISTORY: Past Medical History  Diagnosis Date  . Osteoporosis, unspecified 04/01/2013  . Hyperlipidemia 04/01/2013  . Hearing loss 04/01/2013  . Cancer     cervical / ovarian   . AAA (abdominal aortic aneurysm)   . COPD (chronic obstructive pulmonary disease)   . Stroke   Significant for the patient being status post hysterectomy and bilateral salpingo-oophorectomy under Dr. Arvella Nigh and Dr. Marti Sleigh on 01-09-06.  The final pathology of that surgery ([id number]) showed no residual disease and a total of 14 lymph nodes were negative for carcinoma.  On 11-16-05 the patient had endocervical curettage showing a high grade squamous intraepithelial lesion, CIN-3. The patient is followed by Dr. Radene Knee for this problem. She is also status post cholecystectomy.  She has a history of hypercholesterolemia, osteoporosis, bilateral hearing loss, question of hypertension and ongoing tobacco abuse.  She has an ascending thoracic aortic aneurysm which measured 3.8 cm. by the recent breast MRI and she has problems with bladder atony and stress incontinence.  PAST  SURGICAL HISTORY: Past Surgical History  Procedure Laterality Date  . Abdominal hysterectomy    . Cholecystectomy      FAMILY HISTORY Family History  Problem Relation Age of Onset  . Hypertension Mother   .  Hypertension Father   . Diabetes Father   . Heart disease Father   . Deep vein thrombosis Sister   . Diabetes Daughter   . Hyperlipidemia Daughter   . Diabetes Son   . Hyperlipidemia Son    The patient's father died at the age of 31 from a myocardial infarction.  The patient's mother died at the age of 55 from a stroke.  She has two sisters and one brother.  No history of breast or ovarian cancer in the family.    GYNECOLOGIC HISTORY: She is GX P2, first pregnancy age 59, change of life age 53.  She did not take hormone replacement.  SOCIAL HISTORY: (Updated 04/01/2013) She used to work in Therapist, art.  She has been married 83+ years to Barnett.  He is a semi-retired Land.  Their daughter, Manuela Schwartz, is a Biochemist, clinical.  She works for a [employer].  Their son, Shanon Brow, works in Radio producer and lives in [place].  The patient has one grandchild.  She is not a Ambulance person.    ADVANCED DIRECTIVES:  HEALTH MAINTENANCE:  (Updated 04/01/2013) History  Substance Use Topics  . Smoking status: Current Every Day Smoker -- 1.50 packs/day for 61 years    Types: Cigarettes  . Smokeless tobacco: Never Used  . Alcohol Use: Yes     Comment: rarely     Colonoscopy:  PAP:  S/p TAH/BSO 2007  Bone density: June 2014, osteoporosis  Lipid panel: UTD, [name]   No Known Allergies  Current Outpatient Prescriptions  Medication Sig Dispense Refill  . aspirin 81 MG tablet Take 81 mg by mouth.      . calcium carbonate (TUMS EX) 750 MG chewable tablet Chew 2 tablets by mouth daily.      . fish oil-omega-3 fatty acids 1000 MG capsule Take 1 g by mouth 2 (two) times daily.      . Multiple Vitamin (MULTIVITAMIN) tablet Take 1 tablet by mouth daily.      . pravastatin (PRAVACHOL) 10 MG tablet Take 10 mg by mouth daily.      . tamoxifen (NOLVADEX) 20 MG tablet Take 1 tablet (20 mg total) by mouth daily.  90 tablet  2  . tiotropium (SPIRIVA) 18 MCG inhalation capsule  Place 18 mcg into inhaler and inhale daily.      Marland Kitchen alendronate (FOSAMAX) 70 MG tablet Take 70 mg by mouth once a week. Take with a full glass of water on an empty stomach.      . Mirabegron (MYRBETRIQ PO) Take 20 mg by mouth daily.       No current facility-administered medications for this visit.    OBJECTIVE: Elderly white woman who appears well and is in no acute distress Filed Vitals:   04/21/14 1106  BP: 137/73  Pulse: 98  Temp: 97.6 F (36.4 C)  Resp: 18     Body mass index is 16.65 kg/(m^2).    ECOG FS: 1 Filed Weights   04/21/14 1106  Weight: 88 lb 1.6 oz (39.962 kg)   Physical Exam:  Skin: warm, dry  HEENT: sclerae anicteric, conjunctivae pink, oropharynx clear. No thrush or mucositis.  Lymph Nodes: No cervical or supraclavicular lymphadenopathy  Lungs: clear to auscultation bilaterally, no rales,  wheezes, or rhonci  Heart: regular rate and rhythm  Abdomen: round, soft, non tender, positive bowel sounds  Musculoskeletal: No focal spinal tenderness, no peripheral edema  Neuro: non focal, well oriented, positive affect  Breasts: left breast status post lumpectomy. No evidence of local recurrence. Left axilla benign. Right breast unremarkable.    LAB RESULTS: Lab Results  Component Value Date   WBC 14.0* 04/14/2014   NEUTROABS 11.9* 04/14/2014   HGB 13.9 04/14/2014   HCT 43.8 04/14/2014   MCV 98.2 04/14/2014   PLT 224 04/14/2014      Chemistry      Component Value Date/Time   NA 140 04/14/2014 1058   NA 140 11/28/2013 0432   K 4.3 04/14/2014 1058   K 4.0 11/28/2013 0432   CL 104 11/28/2013 0432   CL 105 03/26/2012 1145   CO2 26 04/14/2014 1058   CO2 27 11/28/2013 0432   BUN 24.1 04/14/2014 1058   BUN 7 11/28/2013 0432   CREATININE 1.5* 04/14/2014 1058   CREATININE 0.75 11/28/2013 0432      Component Value Date/Time   CALCIUM 9.0 04/14/2014 1058   CALCIUM 8.7 11/28/2013 0432   ALKPHOS 47 04/14/2014 1058   ALKPHOS 50 11/28/2013 0432   AST 16 04/14/2014 1058   AST  19 11/28/2013 0432   ALT 10 04/14/2014 1058   ALT 11 11/28/2013 0432   BILITOT 0.32 04/14/2014 1058   BILITOT 0.4 11/28/2013 0432       STUDIES:  Most recent mammogram at [facility] on 04/04/2013 was unremarkable. Patient overdue for this year's mammogram.   Bone density at [facility] on 12/02/2012 confirms osteoporosis. Patient was discontinued on fosamax earlier this year, patient unclear why.  ASSESSMENT: 80 y.o. [place] woman   (1)  status post left lumpectomy and sentinel lymph node biopsy May 2008 for a T1c N0 grade 2 invasive lobular carcinoma, which was ER positive, but PR and HER2 negative, with an MIB-1 of 17%.   (2)  She received no radiation and has been on tamoxifen since July 2008 with good tolerance. The plan is to continue tamoxifen for total of 10 years.  (3)  She is status post [facility] July 2007 for CIN-3 of the cervix, the final pathology showing no residual disease and with a 0 of 14 lymph nodes involved.   PLAN:  Allyna is doing moderately well today. The labs were reviewed in detail and showed an elevated WBC and creatinine level. As noted above, Deloise has a long standing history of urinary issues and UTIs. She plans to have some of this resolved at her urologist this afternoon, so I will not be checking for a UTI today. She is a current smoker, and I warned her that cigarette smoke irritates the bladder as well. She is unmotivated to quit at this time.  Falicity is tolerating the tamoxifen well and the plan is to continue this drug for a total of 10 years. Shereta is overdue for her mammogram this year, and it is on her "to-do list" to schedule but she has been so busy that it has slipped her mind.   Gurbani will return next year for labs and an office visit. She understands and agrees with this plan. She knows the goal of treatment in her case is cure. She has been encouraged to call with any issues that might arise before her next visit here.    Marcelino Duster    04/21/2014

## 2014-05-05 ENCOUNTER — Other Ambulatory Visit: Payer: Self-pay | Admitting: *Deleted

## 2014-05-05 DIAGNOSIS — C50912 Malignant neoplasm of unspecified site of left female breast: Secondary | ICD-10-CM

## 2014-05-05 MED ORDER — TAMOXIFEN CITRATE 20 MG PO TABS: 20.0000 mg | ORAL_TABLET | Freq: Every day | ORAL | Status: DC

## 2014-06-01 ENCOUNTER — Other Ambulatory Visit: Payer: Self-pay | Admitting: Family Medicine

## 2014-06-01 DIAGNOSIS — R55 Syncope and collapse: Secondary | ICD-10-CM

## 2014-06-02 ENCOUNTER — Inpatient Hospital Stay: Admission: RE | Admit: 2014-06-02 | Payer: Medicare Other | Source: Ambulatory Visit

## 2014-06-02 ENCOUNTER — Ambulatory Visit
Admission: RE | Admit: 2014-06-02 | Discharge: 2014-06-02 | Disposition: A | Discharge status: Home / Self Care | Payer: Medicare Other | Source: Ambulatory Visit | Attending: Family Medicine | Admitting: Family Medicine

## 2014-06-02 DIAGNOSIS — R55 Syncope and collapse: Secondary | ICD-10-CM

## 2014-06-03 ENCOUNTER — Encounter: Payer: Self-pay | Admitting: *Deleted

## 2014-06-03 ENCOUNTER — Other Ambulatory Visit: Payer: Self-pay | Admitting: *Deleted

## 2014-06-03 ENCOUNTER — Encounter (INDEPENDENT_AMBULATORY_CARE_PROVIDER_SITE_OTHER): Payer: Medicare Other

## 2014-06-03 DIAGNOSIS — R55 Syncope and collapse: Secondary | ICD-10-CM

## 2014-06-03 NOTE — Progress Notes (Signed)
Patient ID: Ruth [name], female   DOB: 1933/04/30, 79 y.o.   MRN: [id number] [employer] 24 hour holter monitor applied to patient.

## 2014-06-03 NOTE — Progress Notes (Signed)
Patient ID: Ruth [name], female   DOB: Nov 04, 1932, 80 y.o.   MRN: [id number] [employer] 24 hour holter monitor applied to patient.

## 2014-06-09 ENCOUNTER — Ambulatory Visit
Admission: RE | Admit: 2014-06-09 | Discharge: 2014-06-09 | Disposition: A | Discharge status: Home / Self Care | Payer: Medicare Other | Source: Ambulatory Visit | Attending: Family Medicine | Admitting: Family Medicine

## 2014-06-09 DIAGNOSIS — R55 Syncope and collapse: Secondary | ICD-10-CM

## 2014-06-09 MED ORDER — GADOBENATE DIMEGLUMINE 529 MG/ML IV SOLN
8.0000 mL | Freq: Once | INTRAVENOUS | Status: AC | PRN
  Administered 2014-06-09: 8 mL via INTRAVENOUS

## 2014-06-22 ENCOUNTER — Ambulatory Visit (INDEPENDENT_AMBULATORY_CARE_PROVIDER_SITE_OTHER): Payer: Medicare Other | Admitting: Neurology

## 2014-06-22 ENCOUNTER — Encounter: Payer: Self-pay | Admitting: Neurology

## 2014-06-22 VITALS — BP 144/94 | HR 98 | Temp 98.0°F | Ht 61.0 in | Wt 94.0 lb

## 2014-06-22 DIAGNOSIS — I639 Cerebral infarction, unspecified: Secondary | ICD-10-CM

## 2014-06-22 DIAGNOSIS — R5382 Chronic fatigue, unspecified: Secondary | ICD-10-CM

## 2014-06-22 DIAGNOSIS — R569 Unspecified convulsions: Secondary | ICD-10-CM

## 2014-06-22 NOTE — Patient Instructions (Signed)
As far as diagnostic testing: I will order an EEG.  I would like to see you back in 3 months, sooner if we need to. Please call us with any interim questions, concerns, problems, updates or refill requests.   Please also call us for any test results so we can go over those with you on the phone.  My clinical assistant and will answer any of your questions and relay your messages to me and also relay most of my messages to you.   Our phone number is [phone]. We also have an after hours call service for urgent matters and there is a physician on-call for urgent questions. For any emergencies you know to call 911 or go to the nearest emergency room

## 2014-06-22 NOTE — Progress Notes (Addendum)
[facility]    Provider:  Dr Jaynee Eagles Referring Provider: Marylene Land, MD Primary Care Physician:  Marylene Land, MD  CC:  Syncope  HPI:  Ruth [name] is a 80 y.o. female here as a referral from [name] for Syncopal episodes vs TIA. PMHx COPD, Impaired glucose tolerance, bronchitis, HLD, SCC cervix, 2008 breast cancer, osteoporosis. She has had a stroke, she is on Plavix and a statin.   Few weeks ago she went to the kitchen and something happened, her right arm felt weak. She slid down the cabinets and wacked her head. She just layed there. She doesn't remember bumping her head or getting on the floor, but she just layed there until she could get up. Maybe 5 minutes doesn't really know. Patient was admitted to the ED and MRi did not show anything acute.   This has happened before, she has fallen and lost consciousness. One time, husband says she was answering him but she doesn't remember the incident, this was months ago sometime around easter. Another time she was taking a shower and she became altered, started talking "gibberish", she was in "[place]" and daughter called husband(per review of notes, her eyes rolled to the left and afterwards she slept for several hours). Patient woke up in the ambulance. She was non responsive for 5 minutes. She had no idea what was going on (she was still confused when she got to the ED, per  Notes reviewed). She has had one big event but possibly multiple other smaller events when she was alone. No urine loss, no tongue biting. Mother died of a stroke but no personal or family history of a seizures. Unknown triggers, denies etoh, denies drugs. No abnormal movements reported when she was non responsive.   Reviewed notes, labs and imaging from outside physicians, which showed: MRI of the brain w/wo w eeks ago and mri brain w/o contrast in June did not show acute events,  (personally reviewed images) showed age-related atrophy and  chronic non-specific WM changes. MRA of the head unremarkable but did note a subacute cva possibly. EEG in June 2015 was normal. TSH nml, last CMP with AKI, last CBC unremarkable, hgba1c 5.7. Of note, on review of records it does appear that patient lost control of her bowels when she had the incident in June. 24 hour holter was placed on 12/9. Carotid duplex was 1-39% bilaterally.  Review of Systems: Patient complains of symptoms per HPI as well as the following symptoms: No CP, No SOB. Fatigue, blurred vision, easy bruising, hearing loss, ringing in ears, incontinence, memory loss, passing out, decreased energy, passing out. Pertinent negatives per HPI. All others negative.   History   Social History  . Marital Status: Married    Spouse Name: N/A    Number of Children: N/A  . Years of Education: N/A   Occupational History  . Not on file.   Social History Main Topics  . Smoking status: Current Every Day Smoker -- 1.50 packs/day for 61 years    Types: Cigarettes  . Smokeless tobacco: Never Used  . Alcohol Use: Yes     Comment: rarely  . Drug Use: No  . Sexual Activity: No   Other Topics Concern  . Not on file   Social History Narrative    Family History  Problem Relation Age of Onset  . Hypertension Mother   . Hypertension Father   . Diabetes Father   . Heart disease Father   .  Deep vein thrombosis Sister   . Diabetes Daughter   . Hyperlipidemia Daughter   . Diabetes Son   . Hyperlipidemia Son   . Seizures Neg Hx     Past Medical History  Diagnosis Date  . Osteoporosis, unspecified 04/01/2013  . Hyperlipidemia 04/01/2013  . Hearing loss 04/01/2013  . Cancer     cervical / ovarian   . AAA (abdominal aortic aneurysm)   . COPD (chronic obstructive pulmonary disease)   . Stroke   . Syncope and collapse   . Breast cancer   . Ovarian cancer   . Skin cancer     Past Surgical History  Procedure Laterality Date  . Abdominal hysterectomy    . Cholecystectomy    .  Ovarian cyst removed    . Cholecystectomy    . Breast lumpectomy      Current Outpatient Prescriptions  Medication Sig Dispense Refill  . acidophilus (RISAQUAD) CAPS capsule Take 1 capsule by mouth daily.    . calcium carbonate (TUMS EX) 750 MG chewable tablet Chew 2 tablets by mouth daily.    . clopidogrel (PLAVIX) 75 MG tablet Take 75 mg by mouth daily.    . fish oil-omega-3 fatty acids 1000 MG capsule Take 1 g by mouth 2 (two) times daily.    . Multiple Vitamin (MULTIVITAMIN) tablet Take 1 tablet by mouth daily.    . pantoprazole (PROTONIX) 40 MG tablet Take 40 mg by mouth daily.    . pravastatin (PRAVACHOL) 10 MG tablet Take 10 mg by mouth daily.    . tamoxifen (NOLVADEX) 20 MG tablet Take 1 tablet (20 mg total) by mouth daily. 90 tablet 2  . tiotropium (SPIRIVA) 18 MCG inhalation capsule Place 18 mcg into inhaler and inhale daily.    . Mirabegron (MYRBETRIQ PO) Take 20 mg by mouth daily.     No current facility-administered medications for this visit.    Allergies as of 06/22/2014  . (No Known Allergies)    Vitals: BP 144/94 mmHg  Pulse 98  Temp(Src) 98 F (36.7 C) (Oral)  Ht 5\' 1"  (1.549 m)  Wt 94 lb (42.638 kg)  BMI 17.77 kg/m2 Last Weight:  Wt Readings from Last 1 Encounters:  06/22/14 94 lb (42.638 kg)   Last Height:   Ht Readings from Last 1 Encounters:  06/22/14 5\' 1"  (1.549 m)    Physical exam: Exam: Gen: NAD, conversant, well nourised, well groomed                     CV: RRR, no MRG. No Carotid Bruits. No peripheral edema, warm, nontender Eyes: Conjunctivae clear without exudates or hemorrhage  Neuro: Detailed Neurologic Exam  Speech:    Speech is normal; fluent and spontaneous with normal comprehension.  Cognition:    The patient is oriented to person, place, and time;     recent and remote memory intact;     language fluent;     normal attention, concentration,     fund of knowledge Cranial Nerves:    The pupils are equal, round, and  reactive to light. The fundi are normal and spontaneous venous pulsations are present. Visual fields are full to finger confrontation. Extraocular movements are intact. Trigeminal sensation is intact and the muscles of mastication are normal. The face is symmetric. The palate elevates in the midline. Hearing intact. Voice is normal. Shoulder shrug is normal. The tongue has normal motion without fasciculations.   Coordination:    Normal finger to  nose and heel to shin. Normal rapid alternating movements.   Gait:    Heel-toe and tandem gait are normal.   Motor Observation:    No asymmetry, no atrophy, and no involuntary movements noted. Tone:    Normal muscle tone.    Posture:    Posture is normal. normal erect    Strength:    Strength is V/V in the upper and lower limbs.      Sensation: intact     Reflex Exam:  DTR's:    Deep tendon reflexes in the upper and lower extremities are normal bilaterally.   Toes:    The toes are downgoing bilaterally.   Clonus:    Clonus is absent.      Assessment/Plan:  80 year old patient with multiple events that are suspicious for seizure activity. I do think episodes are most likely to be seizures, many aspects not c/w TIA or syncope. I do recommend starting AEds but patient declines. Neuro exam is non focal. Will perform EEG. Discussed at length starting AEDs, benefits and risks, and patient prefers to complete the workup before considering AEDs. Discussed refraining from driving, bathing alone or doing anything that may jeopardize her safety or that of others for at least 6 months or per state or locality where they reside. I highly recommend starting AEDs. I would also like a 3-day in home EEG if routine EEG is again negative.  Stroke: Continue Plavix and statin for stroke prevention. Follow up with PCP for management of risk factors. LDL goal < 70.  Seizure: Routine EEG, consider 3-day ambulatory if negative  A total of 60 minutes was spent  with this patient. Over half this time was spent on counseling patient on the diagnosis and different therapeutic options available.   Current smoker: highly recommend cessation.     Sarina Ill, MD  [facility] [address] [place], [place] [address]  Phone [phone] Fax [phone]

## 2014-06-23 LAB — COMPREHENSIVE METABOLIC PANEL
A/G RATIO: 1.8 (ref 1.1–2.5)
ALBUMIN: 4.4 g/dL (ref 3.5–4.8)
ALK PHOS: 48 IU/L (ref 39–117)
ALT: 9 IU/L (ref 0–32)
AST: 21 IU/L (ref 0–40)
BILIRUBIN TOTAL: 0.3 mg/dL (ref 0.0–1.2)
BUN/Creatinine Ratio: 14 (ref 11–26)
BUN: 18 mg/dL (ref 8–27)
CO2: 26 mmol/L (ref 18–29)
CREATININE: 1.29 mg/dL — AB (ref 0.57–1.00)
Calcium: 9.7 mg/dL (ref 8.7–10.3)
Chloride: 99 mmol/L (ref 97–108)
GFR calc non Af Amer: 39 mL/min/{1.73_m2} — ABNORMAL LOW (ref >59)
GFR, EST AFRICAN AMERICAN: 46 mL/min/{1.73_m2} — AB (ref >59)
GLUCOSE: 83 mg/dL (ref 65–99)
Globulin, Total: 2.5 g/dL (ref 1.5–4.5)
Potassium: 4.2 mmol/L (ref 3.5–5.2)
Sodium: 141 mmol/L (ref 134–144)
TOTAL PROTEIN: 6.9 g/dL (ref 6.0–8.5)

## 2014-06-23 LAB — THYROID PANEL WITH TSH
FREE THYROXINE INDEX: 2.4 (ref 1.2–4.9)
T3 UPTAKE RATIO: 26 % (ref 24–39)
T4, Total: 9.2 ug/dL (ref 4.5–12.0)
TSH: 0.772 u[IU]/mL (ref 0.450–4.500)

## 2014-06-23 LAB — CBC
HEMATOCRIT: 43 % (ref 34.0–46.6)
Hemoglobin: 14.4 g/dL (ref 11.1–15.9)
MCH: 32.4 pg (ref 26.6–33.0)
MCHC: 33.5 g/dL (ref 31.5–35.7)
MCV: 97 fL (ref 79–97)
PLATELETS: 206 10*3/uL (ref 150–379)
RBC: 4.45 x10E6/uL (ref 3.77–5.28)
RDW: 12.9 % (ref 12.3–15.4)
WBC: 8.8 10*3/uL (ref 3.4–10.8)

## 2014-06-25 ENCOUNTER — Encounter: Payer: Self-pay | Admitting: Neurology

## 2014-06-29 ENCOUNTER — Ambulatory Visit (INDEPENDENT_AMBULATORY_CARE_PROVIDER_SITE_OTHER): Payer: Medicare Other | Admitting: Neurology

## 2014-06-29 DIAGNOSIS — R569 Unspecified convulsions: Secondary | ICD-10-CM

## 2014-06-29 NOTE — Procedures (Signed)
    History:  Ruth [name] is a 81 year old patient with a history of an event that occurred several weeks prior to this study associated with right arm weakness, and then a slumping episode. The patient has had similar events in the past. The patient being evaluated for these episodes.  This is a routine EEG. No skull defects are noted. Medications include calcium supplementation, Plavix, fish oil, Myrbetriq, multivitamins, Protonix, Pravachol, tamoxifen, and Spiriva.   EEG classification: Normal awake  Description of the recording: The background rhythms of this recording consists of a fairly well modulated medium amplitude alpha rhythm of 11 Hz that is reactive to eye opening and closure. As the record progresses, the patient appears to remain in the waking state throughout the recording. Photic stimulation was performed, resulting in a bilateral and symmetric photic driving response. Hyperventilation was also performed, resulting in a minimal buildup of the background rhythm activities without significant slowing seen. At no time during the recording does there appear to be evidence of spike or spike wave discharges or evidence of focal slowing. EKG monitor shows no evidence of cardiac rhythm abnormalities with a heart rate of 84.  Impression: This is a normal EEG recording in the waking state. No evidence of ictal or interictal discharges are seen.

## 2014-07-03 ENCOUNTER — Telehealth: Payer: Self-pay | Admitting: *Deleted

## 2014-07-03 ENCOUNTER — Telehealth: Payer: Self-pay | Admitting: Neurology

## 2014-07-03 NOTE — Telephone Encounter (Signed)
-----   Message from Melvenia Beam, MD sent at 06/30/2014 10:02 AM EST ----- Please let patient know her EEG was normal. Thank you

## 2014-07-03 NOTE — Telephone Encounter (Signed)
Left patient a message that MRI and EEG was normal.

## 2014-07-03 NOTE — Telephone Encounter (Signed)
Called patient, left message on cell pohone number. Labs showed elevated creatinine(1.29), worse compared with 7 months ago(WNL) ago but better than 2 months ago (1.5). She should follow with pcp for this. EEG was normal. MRi of the brain showed atrophy but was stable from prior. I had suggested possibly doing an extended eeg and/or starting on AEDs at last appointment. She was hesitant to do either at that time. Asked patient to call back if she would like to further discuss. Thank you.

## 2014-09-28 ENCOUNTER — Encounter: Payer: Self-pay | Admitting: Vascular Surgery

## 2014-09-29 ENCOUNTER — Encounter: Payer: Self-pay | Admitting: Vascular Surgery

## 2014-09-29 ENCOUNTER — Ambulatory Visit (HOSPITAL_COMMUNITY)
Admission: RE | Admit: 2014-09-29 | Discharge: 2014-09-29 | Disposition: A | Discharge status: Home / Self Care | Payer: Medicare Other | Source: Ambulatory Visit | Attending: Vascular Surgery | Admitting: Vascular Surgery

## 2014-09-29 ENCOUNTER — Ambulatory Visit (INDEPENDENT_AMBULATORY_CARE_PROVIDER_SITE_OTHER): Payer: Medicare Other | Admitting: Vascular Surgery

## 2014-09-29 VITALS — BP 152/78 | HR 83 | Ht 61.0 in | Wt 92.0 lb

## 2014-09-29 DIAGNOSIS — I714 Abdominal aortic aneurysm, without rupture, unspecified: Secondary | ICD-10-CM

## 2014-09-29 NOTE — Progress Notes (Signed)
Here today for follow-up of infrarenal abdominal aortic aneurysm. I'd seen her 6 months ago when CT scan for other issues had shown a 4 cm infrarenal abdominal aortic aneurysm. She is here today for 6 month ultrasound follow-up. Major medical problems. Nothing referable to her aneurysm.  Past Medical History  Diagnosis Date  . Osteoporosis, unspecified 04/01/2013  . Hyperlipidemia 04/01/2013  . Hearing loss 04/01/2013  . Cancer     cervical / ovarian   . AAA (abdominal aortic aneurysm)   . COPD (chronic obstructive pulmonary disease)   . Stroke   . Syncope and collapse   . Breast cancer   . Ovarian cancer   . Skin cancer     History  Substance Use Topics  . Smoking status: Current Every Day Smoker -- 1.50 packs/day for 61 years    Types: Cigarettes  . Smokeless tobacco: Never Used  . Alcohol Use: Yes     Comment: rarely    Family History  Problem Relation Age of Onset  . Hypertension Mother   . Hypertension Father   . Diabetes Father   . Heart disease Father   . Deep vein thrombosis Sister   . Diabetes Daughter   . Hyperlipidemia Daughter   . Diabetes Son   . Hyperlipidemia Son   . Seizures Neg Hx     No Known Allergies   Current outpatient prescriptions:  .  acidophilus (RISAQUAD) CAPS capsule, Take 1 capsule by mouth daily., Disp: , Rfl:  .  calcium carbonate (TUMS EX) 750 MG chewable tablet, Chew 2 tablets by mouth daily., Disp: , Rfl:  .  clopidogrel (PLAVIX) 75 MG tablet, Take 75 mg by mouth daily., Disp: , Rfl:  .  fish oil-omega-3 fatty acids 1000 MG capsule, Take 1 g by mouth 2 (two) times daily., Disp: , Rfl:  .  Mirabegron (MYRBETRIQ PO), Take 20 mg by mouth daily., Disp: , Rfl:  .  Multiple Vitamin (MULTIVITAMIN) tablet, Take 1 tablet by mouth daily., Disp: , Rfl:  .  pantoprazole (PROTONIX) 40 MG tablet, Take 40 mg by mouth daily., Disp: , Rfl:  .  pravastatin (PRAVACHOL) 10 MG tablet, Take 10 mg by mouth daily., Disp: , Rfl:  .  tamoxifen (NOLVADEX) 20  MG tablet, Take 1 tablet (20 mg total) by mouth daily., Disp: 90 tablet, Rfl: 2 .  tiotropium (SPIRIVA) 18 MCG inhalation capsule, Place 18 mcg into inhaler and inhale daily., Disp: , Rfl:   Filed Vitals:   09/29/14 1027 09/29/14 1030  BP: 144/87 152/78  Pulse: 87 83  Height: 5\' 1"  (1.549 m)   Weight: 92 lb (41.731 kg)   SpO2: 95%     Body mass index is 17.39 kg/(m^2).       Physical exam: Well-developed well-nourished female no acute distress Aspirations are equal in nonlabored with clear breath sounds bilaterally Carotid arteries without bruits bilaterally 2+ radial 2+ popliteal and 2+ dorsalis pedis pulses Abdomen soft nontender. Easily palpable aneurysm which is nontender. She is quite thin.  Ultrasound today shows maximal diameter 4.3 cm.  Had long discussion with the patient and her husband present. Explained that the anticipated slow growth of this aneurysm. Explained we will see her in one year. Did explain symptoms of leaking aneurysm and she knows report immediately to the emergency room should this occur. Otherwise we'll see her in one year with repeat ultrasound

## 2014-09-29 NOTE — Addendum Note (Signed)
Addended by: Dorthula Rue L on: 09/29/2014 11:30 AM   Modules accepted: Orders

## 2014-10-21 ENCOUNTER — Other Ambulatory Visit: Payer: Self-pay | Admitting: Family Medicine

## 2014-10-21 ENCOUNTER — Ambulatory Visit
Admission: RE | Admit: 2014-10-21 | Discharge: 2014-10-21 | Disposition: A | Discharge status: Home / Self Care | Payer: Medicare Other | Source: Ambulatory Visit | Attending: Family Medicine | Admitting: Family Medicine

## 2014-10-21 DIAGNOSIS — M545 Low back pain: Secondary | ICD-10-CM

## 2015-02-12 ENCOUNTER — Other Ambulatory Visit: Payer: Self-pay | Admitting: *Deleted

## 2015-02-12 DIAGNOSIS — C50912 Malignant neoplasm of unspecified site of left female breast: Secondary | ICD-10-CM

## 2015-02-12 MED ORDER — TAMOXIFEN CITRATE 20 MG PO TABS: 20.0000 mg | ORAL_TABLET | Freq: Every day | ORAL | Status: DC

## 2015-02-12 NOTE — Telephone Encounter (Signed)
Patient's spouse called reporting his wife needs refill on Tamoxifen sent to [facility] at [address]

## 2015-03-31 ENCOUNTER — Telehealth: Payer: Self-pay | Admitting: Neurology

## 2015-03-31 NOTE — Telephone Encounter (Signed)
Cheri/Dr Blomgren [phone] called to advise, patient is confused about whether she is to be taking aspirin or Plavix or both. Please call Cheri/[name] to advise.

## 2015-03-31 NOTE — Telephone Encounter (Signed)
Spoke w/ Cheri from Dr. Deboraha Sprang office. Advised I am not familiar with the pt, that I was not in the office at the time the pt was here. Looked at Dr. Jaynee Eagles last office note, but unclear if Dr. Jaynee Eagles wanted her to take aspirin or plavix or both. Advised Dr. Jaynee Eagles is out of the office for family emergency and I will try and contact her to find out. I will call her back as soon as possible. She said to call [phone].

## 2015-04-01 ENCOUNTER — Other Ambulatory Visit: Payer: Self-pay | Admitting: Neurology

## 2015-04-01 MED ORDER — CLOPIDOGREL BISULFATE 75 MG PO TABS: 75.0000 mg | ORAL_TABLET | Freq: Every day | ORAL | Status: AC

## 2015-04-01 NOTE — Telephone Encounter (Signed)
Called and spoke with Ruth [name] from Dr. Deboraha Sprang office and advised per Dr. Jaynee Eagles that pt can stay on the plavix. She does not need to take the aspirin. She verbalized understanding.

## 2015-04-01 NOTE — Telephone Encounter (Signed)
Spoke to patient. She can stay on the Plavix. Does not need to take aspirin. Would you let Dr. Deboraha Sprang office know? Thanks.

## 2015-04-13 ENCOUNTER — Other Ambulatory Visit: Payer: Medicare Other

## 2015-04-16 ENCOUNTER — Telehealth: Payer: Self-pay | Admitting: Oncology

## 2015-04-16 NOTE — Telephone Encounter (Signed)
RETURNED CALL TO PATIENT HUSBAND RE [facility] REMINDER AND WANTING TO CONFIRM APPOINTMENT. SPOKE WITH HUSBAND RE LAB/FU 10/25 @ 3 PM. PATIENT MISSED LAB LAST WEEK AND LAB ADD TO 10/25 F/U.

## 2015-04-20 ENCOUNTER — Ambulatory Visit (HOSPITAL_BASED_OUTPATIENT_CLINIC_OR_DEPARTMENT_OTHER): Payer: Medicare Other | Admitting: Oncology

## 2015-04-20 ENCOUNTER — Encounter: Payer: Self-pay | Admitting: Oncology

## 2015-04-20 ENCOUNTER — Telehealth: Payer: Self-pay | Admitting: Neurology

## 2015-04-20 ENCOUNTER — Telehealth: Payer: Self-pay | Admitting: Nurse Practitioner

## 2015-04-20 ENCOUNTER — Other Ambulatory Visit (HOSPITAL_BASED_OUTPATIENT_CLINIC_OR_DEPARTMENT_OTHER): Payer: Medicare Other

## 2015-04-20 VITALS — BP 159/82 | HR 107 | Temp 98.3°F | Resp 18 | Ht 61.0 in | Wt 95.8 lb

## 2015-04-20 DIAGNOSIS — C50912 Malignant neoplasm of unspecified site of left female breast: Secondary | ICD-10-CM

## 2015-04-20 DIAGNOSIS — C50012 Malignant neoplasm of nipple and areola, left female breast: Secondary | ICD-10-CM | POA: Diagnosis present

## 2015-04-20 DIAGNOSIS — Z720 Tobacco use: Secondary | ICD-10-CM | POA: Diagnosis not present

## 2015-04-20 DIAGNOSIS — C50019 Malignant neoplasm of nipple and areola, unspecified female breast: Secondary | ICD-10-CM

## 2015-04-20 DIAGNOSIS — M810 Age-related osteoporosis without current pathological fracture: Secondary | ICD-10-CM | POA: Diagnosis not present

## 2015-04-20 LAB — COMPREHENSIVE METABOLIC PANEL (CC13)
ALT: 9 U/L (ref 0–55)
AST: 17 U/L (ref 5–34)
Albumin: 3.6 g/dL (ref 3.5–5.0)
Alkaline Phosphatase: 57 U/L (ref 40–150)
Anion Gap: 7 mEq/L (ref 3–11)
BUN: 16.1 mg/dL (ref 7.0–26.0)
CALCIUM: 9.6 mg/dL (ref 8.4–10.4)
CHLORIDE: 104 meq/L (ref 98–109)
CO2: 30 mEq/L — ABNORMAL HIGH (ref 22–29)
CREATININE: 1.1 mg/dL (ref 0.6–1.1)
EGFR: 45 mL/min/{1.73_m2} — ABNORMAL LOW (ref >90)
GLUCOSE: 119 mg/dL (ref 70–140)
Potassium: 4.1 mEq/L (ref 3.5–5.1)
Sodium: 141 mEq/L (ref 136–145)
Total Bilirubin: <0.3 mg/dL (ref 0.20–1.20)
Total Protein: 7.4 g/dL (ref 6.4–8.3)

## 2015-04-20 LAB — CBC WITH DIFFERENTIAL/PLATELET
BASO%: 0.7 % (ref 0.0–2.0)
Basophils Absolute: 0.1 10*3/uL (ref 0.0–0.1)
EOS%: 1.2 % (ref 0.0–7.0)
Eosinophils Absolute: 0.1 10*3/uL (ref 0.0–0.5)
HEMATOCRIT: 46.6 % (ref 34.8–46.6)
HEMOGLOBIN: 15.4 g/dL (ref 11.6–15.9)
LYMPH#: 1.8 10*3/uL (ref 0.9–3.3)
LYMPH%: 25 % (ref 14.0–49.7)
MCH: 31.7 pg (ref 25.1–34.0)
MCHC: 33.1 g/dL (ref 31.5–36.0)
MCV: 95.6 fL (ref 79.5–101.0)
MONO#: 0.5 10*3/uL (ref 0.1–0.9)
MONO%: 7.4 % (ref 0.0–14.0)
NEUT#: 4.8 10*3/uL (ref 1.5–6.5)
NEUT%: 65.7 % (ref 38.4–76.8)
Platelets: 207 10*3/uL (ref 145–400)
RBC: 4.87 10*6/uL (ref 3.70–5.45)
RDW: 13.6 % (ref 11.2–14.5)
WBC: 7.3 10*3/uL (ref 3.9–10.3)

## 2015-04-20 MED ORDER — VITAMIN D 1000 UNITS PO TABS: 1000.0000 [IU] | ORAL_TABLET | Freq: Every day | ORAL | Status: AC

## 2015-04-20 NOTE — Telephone Encounter (Signed)
Appointments made and avs printed and mailed to patient °

## 2015-04-20 NOTE — Progress Notes (Signed)
ID: Ruth [name]   DOB: 1932/10/30  MR#: [id number]  IRJ#:[id number]  PCP: Marylene Land, MD GYN:  SU: Erroll Luna MD OTHER MD: Franchot Gallo, MD   CHIEF COMPLAINT:  Left Breast Cancer  CURRENT TREATMENT: Tamoxifen   HISTORY OF PRESENT ILLNESS: From the original intake note:   The patient had a screening mammogram at [facility] on 10-17-06 which showed some asymmetry in the left breast.  Additional views were obtained on 10-25-06 and showed approximately a 10 mm. nodular density in the retroareolar region of the left breast.  Ultrasound was obtained and showed it to be a hypoechoic mass measuring 10.8 mm.  Biopsy was obtained the same day and showed ([id number] and [id number]) invasive lobular breast cancer which was 97% estrogen receptor positive, 0% progesterone receptor with a proliferation marker of 17% and HER-2/neu negative at 0.    With this information, the patient was referred to Dr. Brantley Stage and on 10-31-06 bilateral breast MRI's were obtained at [facility].  This showed a bilobed 8 mm. spiculated nodule in the left breast with no evidence of axillary involvement and nothing of importance in the right breast.    With this information, after appropriate discussion, the patient proceeded to left lumpectomy and sentinel lymph node biopsy under Dr. Brantley Stage on 11-23-06.  The final pathology ([id number]) confirmed an invasive lobular carcinoma measuring 1.5 cm., grade 2, with no evidence of lymphovascular invasion, adequate margins and 0 of 3 sentinel lymph nodes involved.   Her subsequent history is as detailed below.  INTERVAL HISTORY: Baillie returns today for follow up of her breast cancer. She continues on tamoxifen, which she tolerates well. Vaginal dryness or witness and hot flashes are not main concerns. She did have an episode of C. difficile colitis this summer, possibly related to antibiotics use for multiple urinary tract infections. "That was held". It  has now cleared and she is having normal bowel movements.   REVIEW OF SYSTEMS: Linsie wears a hearing aid, which "Works okay". Aside from her urinary incontinence problems and occasional headaches, she reports no major problems and she exercises chiefly by walking 5 minutes here and 5 minutes there as she puts it. Unfortunately she is still smoking as does her husband. A detailed review of systems was otherwise stable    PAST MEDICAL HISTORY: Past Medical History  Diagnosis Date  . Osteoporosis, unspecified 04/01/2013  . Hyperlipidemia 04/01/2013  . Hearing loss 04/01/2013  . Cancer     cervical / ovarian   . AAA (abdominal aortic aneurysm)   . COPD (chronic obstructive pulmonary disease)   . Stroke   . Syncope and collapse   . Breast cancer   . Ovarian cancer   . Skin cancer   Significant for the patient being status post hysterectomy and bilateral salpingo-oophorectomy under Dr. Arvella Nigh and Dr. Marti Sleigh on 01-09-06.  The final pathology of that surgery ([id number]) showed no residual disease and a total of 14 lymph nodes were negative for carcinoma.  On 11-16-05 the patient had endocervical curettage showing a high grade squamous intraepithelial lesion, CIN-3. The patient is followed by Dr. Radene Knee for this problem. She is also status post cholecystectomy.  She has a history of hypercholesterolemia, osteoporosis, bilateral hearing loss, question of hypertension and ongoing tobacco abuse.  She has an ascending thoracic aortic aneurysm which measured 3.8 cm. by the recent breast MRI and she has problems with bladder atony and stress incontinence.  PAST SURGICAL HISTORY: Past Surgical History  Procedure  Laterality Date  . Abdominal hysterectomy    . Cholecystectomy    . Ovarian cyst removed    . Cholecystectomy    . Breast lumpectomy      FAMILY HISTORY Family History  Problem Relation Age of Onset  . Hypertension Mother   . Hypertension Father   . Diabetes Father    . Heart disease Father   . Deep vein thrombosis Sister   . Diabetes Daughter   . Hyperlipidemia Daughter   . Diabetes Son   . Hyperlipidemia Son   . Seizures Neg Hx    The patient's father died at the age of 25 from a myocardial infarction.  The patient's mother died at the age of 90 from a stroke.  She has two sisters and one brother.  No history of breast or ovarian cancer in the family.    GYNECOLOGIC HISTORY: She is GX P2, first pregnancy age 40, change of life age 89.  She did not take hormone replacement.  SOCIAL HISTORY: (Updated 04/01/2013) She used to work in Therapist, art.  She has been married 18+ years to [place].  He is a semi-retired Land.  Their daughter, Manuela Schwartz, is a Biochemist, clinical.  She works for a [employer].  Their son, Shanon Brow, works in Radio producer and lives in [place].  The patient has one grandchild.  She is not a Ambulance person.    ADVANCED DIRECTIVES:  HEALTH MAINTENANCE:  (Updated 04/01/2013) Social History  Substance Use Topics  . Smoking status: Current Every Day Smoker -- 1.50 packs/day for 61 years    Types: Cigarettes  . Smokeless tobacco: Never Used  . Alcohol Use: Yes     Comment: rarely     Colonoscopy:  PAP:  S/p TAH/BSO 2007  Bone density: June 2014, osteoporosis  Lipid panel: UTD, [name]   No Known Allergies  Current Outpatient Prescriptions  Medication Sig Dispense Refill  . acidophilus (RISAQUAD) CAPS capsule Take 1 capsule by mouth daily.    . calcium carbonate (TUMS EX) 750 MG chewable tablet Chew 2 tablets by mouth daily.    . clopidogrel (PLAVIX) 75 MG tablet Take 1 tablet (75 mg total) by mouth daily. 30 tablet 11  . fish oil-omega-3 fatty acids 1000 MG capsule Take 1 g by mouth 2 (two) times daily.    . Mirabegron (MYRBETRIQ PO) Take 20 mg by mouth daily.    . Multiple Vitamin (MULTIVITAMIN) tablet Take 1 tablet by mouth daily.    . pantoprazole (PROTONIX) 40 MG tablet Take 40 mg by mouth  daily.    . pravastatin (PRAVACHOL) 10 MG tablet Take 10 mg by mouth daily.    . tamoxifen (NOLVADEX) 20 MG tablet Take 1 tablet (20 mg total) by mouth daily. 90 tablet 0  . tiotropium (SPIRIVA) 18 MCG inhalation capsule Place 18 mcg into inhaler and inhale daily.     No current facility-administered medications for this visit.    OBJECTIVE: Elderly white woman who appears stated age 81 Vitals:   04/20/15 1526  BP: 159/82  Pulse: 107  Temp: 98.3 F (36.8 C)  Resp: 18     Body mass index is 18.11 kg/(m^2).    ECOG FS: 1 Filed Weights   04/20/15 1526  Weight: 95 lb 12.8 oz (43.455 kg)   Sclerae unicteric, pupils round and equal Oropharynx clear and moist-- no thrush or other lesions No cervical or supraclavicular adenopathy Lungs no rales or rhonchi Heart regular rate and rhythm Abd soft,  nontender, positive bowel sounds MSK no focal spinal tenderness, no upper extremity lymphedema Neuro: nonfocal, well oriented, appropriate affect Breasts: The right breast is unremarkable. The left breast is status post lumpectomy. There is no evidence of local recurrence. The left axilla is benign Skin: Strong smell of tobacco   LAB RESULTS: Lab Results  Component Value Date   WBC 7.3 04/20/2015   NEUTROABS 4.8 04/20/2015   HGB 15.4 04/20/2015   HCT 46.6 04/20/2015   MCV 95.6 04/20/2015   PLT 207 04/20/2015      Chemistry      Component Value Date/Time   NA 141 06/22/2014 1611   NA 140 04/14/2014 1058   NA 140 11/28/2013 0432   K 4.2 06/22/2014 1611   K 4.3 04/14/2014 1058   CL 99 06/22/2014 1611   CL 105 03/26/2012 1145   CO2 26 06/22/2014 1611   CO2 26 04/14/2014 1058   BUN 18 06/22/2014 1611   BUN 24.1 04/14/2014 1058   BUN 7 11/28/2013 0432   CREATININE 1.29* 06/22/2014 1611   CREATININE 1.5* 04/14/2014 1058      Component Value Date/Time   CALCIUM 9.7 06/22/2014 1611   CALCIUM 9.0 04/14/2014 1058   ALKPHOS 48 06/22/2014 1611   ALKPHOS 47 04/14/2014 1058   AST  21 06/22/2014 1611   AST 16 04/14/2014 1058   ALT 9 06/22/2014 1611   ALT 10 04/14/2014 1058   BILITOT 0.3 06/22/2014 1611   BILITOT 0.32 04/14/2014 1058       STUDIES: Mammography at [facility] this year unremarkable per patient; written report requested   ASSESSMENT: 81 y.o. [place] woman   (1)  status post left lumpectomy and sentinel lymph node biopsy May 2008 for a T1c N0 grade 2 invasive lobular carcinoma, which was ER positive, but PR and HER2 negative, with an MIB-1 of 17%.   (2)  She received no radiation and has been on tamoxifen since July 2008 with good tolerance. The plan is to continue tamoxifen for total of 10 years.  (3)  She is status post [facility] July 2007 for CIN-3 of the cervix, the final pathology showing no residual disease and with a 0 of 14 lymph nodes involved.  (4) ongoing tobacco abuse: The patient has been strongly encouraged to participate in a smoking cessation program  (5) osteoporosis   PLAN:  Shabreka is now 8 years out from her definitive surgery with no evidence of disease recurrence. This is very favorable. The plan is to continue tamoxifen to more years. She will see as a year from now and then again 2 years from today. At that point she will "graduate" from follow-up here.  We discussed smoking cessation but at this point she is not ready to cut back or participate in a smoking cessation program. She tells me until her husband quits she is not going to be able to area  I think it would be helpful for her to be on vitamin D supplementation and I have started that for her.  She knows to call for any problems that may develop before her next visit here.  MAGRINAT,GUSTAV C    04/20/2015

## 2015-04-20 NOTE — Telephone Encounter (Signed)
Ruth [name], would you call patient and ask her to come to the office for a 30 minute follow up appointment. I would like to discuss a sleep study with her as well as additional eeg testing. I received an office note from [name] about all of this and would like to see her if she is amenable. Thanks!

## 2015-04-21 NOTE — Telephone Encounter (Signed)
Called pt and relayed Dr. Jaynee Eagles message below. Only afternoon appt worked for her. Offered 1115pm appt time, but this was too early. Advised that Dr. Jaynee Eagles needed 28min. Scheduled for 04/28/15 at 3pm for check-in at 245pm. She verbalized understanding.

## 2015-04-28 ENCOUNTER — Encounter: Payer: Self-pay | Admitting: Neurology

## 2015-04-28 ENCOUNTER — Ambulatory Visit (INDEPENDENT_AMBULATORY_CARE_PROVIDER_SITE_OTHER): Payer: Medicare Other | Admitting: Neurology

## 2015-04-28 VITALS — BP 144/88 | HR 110 | Ht 61.0 in | Wt 95.4 lb

## 2015-04-28 DIAGNOSIS — R519 Headache, unspecified: Secondary | ICD-10-CM

## 2015-04-28 DIAGNOSIS — G4710 Hypersomnia, unspecified: Secondary | ICD-10-CM

## 2015-04-28 DIAGNOSIS — R400 Somnolence: Secondary | ICD-10-CM

## 2015-04-28 DIAGNOSIS — R51 Headache: Secondary | ICD-10-CM

## 2015-04-28 NOTE — Progress Notes (Signed)
[id number]    Provider:  Dr Jaynee Eagles Referring Provider: Derinda Late, MD Primary Care Physician:  Marylene Land, MD  CC: Syncope  Interval Update 04/28/2015: Patient returns today. Feels her memory is worsening. She is excessively tired. She doesn't know if she snores. She wakes up with headaches. She feels very tired, she is exhausted. [name] advised that a sleep study may be indicated. She doesn't fall asleep during the day but she is very tired. The fatigue can affect her daily performance and completion of tasks. She goes to sleep between 10 and 11 and wakes up unil 9am and still feels tired all day even though as far as she know, she is sleeping throughout the night. No problems initiating sleep, denies insomnia. The fatigue is affecting her life significantly.   She has not had anymore syncopal episodes or events. No new episodes of altered mentation or loss of consciousness. No accidents and no falls. She doesn't feel the need for further workup and declines a 3-day ambulatory video eeg. However patient does say if she has another episode that she will let me order an extended VEEG.   HPI: Ruth [name] is a 81 y.o. female here as a referral from [name] for Syncopal episodes vs TIA. PMHx COPD, Impaired glucose tolerance, bronchitis, HLD, SCC cervix, 2008 breast cancer, osteoporosis. She has had a stroke, she is on Plavix and a statin.   Few weeks ago she went to the kitchen and something happened, her right arm felt weak. She slid down the cabinets and wacked her head. She just layed there. She doesn't remember bumping her head or getting on the floor, but she just layed there until she could get up. Maybe 5 minutes doesn't really know. Patient was admitted to the ED and MRi did not show anything acute.   This has happened before, she has fallen and lost consciousness. One time, husband says she was answering him but she doesn't remember the incident,  this was months ago sometime around easter. Another time she was taking a shower and she became altered, started talking "gibberish", she was in "[place]" and daughter called husband(per review of notes, her eyes rolled to the left and afterwards she slept for several hours). Patient woke up in the ambulance. She was non responsive for 5 minutes. She had no idea what was going on (she was still confused when she got to the ED, per Notes reviewed). She has had one big event but possibly multiple other smaller events when she was alone. No urine loss, no tongue biting. Mother died of a stroke but no personal or family history of a seizures. Unknown triggers, denies etoh, denies drugs. No abnormal movements reported when she was non responsive.   Reviewed notes, labs and imaging from outside physicians, which showed: MRI of the brain w/wo w eeks ago and mri brain w/o contrast in June did not show acute events, (personally reviewed images) showed age-related atrophy and chronic non-specific WM changes. MRA of the head unremarkable but did note a subacute cva possibly. EEG in June 2015 was normal. TSH nml, last CMP with AKI, last CBC unremarkable, hgba1c 5.7. Of note, on review of records it does appear that patient lost control of her bowels when she had the incident in June. 24 hour holter was placed on 12/9. Carotid duplex was 1-39% bilaterally.  Review of Systems: Patient complains of symptoms per HPI as well as the following symptoms: memory loss, incontinence. Pertinent  negatives per HPI. All others negative.   Social History   Social History  . Marital Status: Married    Spouse Name: N/A  . Number of Children: N/A  . Years of Education: N/A   Occupational History  . Not on file.   Social History Main Topics  . Smoking status: Current Every Day Smoker -- 1.50 packs/day for 61 years    Types: Cigarettes  . Smokeless tobacco: Never Used  . Alcohol Use: 0.0 oz/week    0 Standard drinks or  equivalent per week     Comment: rarely  . Drug Use: No  . Sexual Activity: No   Other Topics Concern  . Not on file   Social History Narrative    Family History  Problem Relation Age of Onset  . Hypertension Mother   . Hypertension Father   . Diabetes Father   . Heart disease Father   . Deep vein thrombosis Sister   . Diabetes Daughter   . Hyperlipidemia Daughter   . Diabetes Son   . Hyperlipidemia Son   . Seizures Neg Hx     Past Medical History  Diagnosis Date  . Osteoporosis, unspecified 04/01/2013  . Hyperlipidemia 04/01/2013  . Hearing loss 04/01/2013  . Cancer ([place])     cervical / ovarian   . AAA (abdominal aortic aneurysm) ([place])   . COPD (chronic obstructive pulmonary disease) ([place])   . Stroke ([place])   . Syncope and collapse   . Breast cancer ([place])   . Ovarian cancer ([place])   . Skin cancer     Past Surgical History  Procedure Laterality Date  . Abdominal hysterectomy    . Cholecystectomy    . Ovarian cyst removed    . Cholecystectomy    . Breast lumpectomy      Current Outpatient Prescriptions  Medication Sig Dispense Refill  . ALENDRONATE SODIUM PO Take 1 tablet by mouth once a week.    . calcium carbonate (TUMS EX) 750 MG chewable tablet Chew 2 tablets by mouth daily.    . cholecalciferol (VITAMIN D) 1000 UNITS tablet Take 1 tablet (1,000 Units total) by mouth daily. 90 tablet 4  . clopidogrel (PLAVIX) 75 MG tablet Take 1 tablet (75 mg total) by mouth daily. 30 tablet 11  . fish oil-omega-3 fatty acids 1000 MG capsule Take 1 g by mouth 2 (two) times daily.    . pravastatin (PRAVACHOL) 10 MG tablet Take 10 mg by mouth daily.    . tamoxifen (NOLVADEX) 20 MG tablet Take 1 tablet (20 mg total) by mouth daily. 90 tablet 0  . tiotropium (SPIRIVA) 18 MCG inhalation capsule Place 18 mcg into inhaler and inhale daily.     No current facility-administered medications for this visit.    Allergies as of 04/28/2015  . (No Known Allergies)    Vitals: BP  144/88 mmHg  Pulse 110  Ht 5\' 1"  (1.549 m)  Wt 95 lb 6.4 oz (43.273 kg)  BMI 18.03 kg/m2 Last Weight:  Wt Readings from Last 1 Encounters:  04/28/15 95 lb 6.4 oz (43.273 kg)   Last Height:   Ht Readings from Last 1 Encounters:  04/28/15 5\' 1"  (1.549 m)     Cognition:  The patient is oriented to person, place, and time;   recent and remote memory intact;   language fluent;   normal attention, concentration,   fund of knowledge Cranial Nerves:  The pupils are equal, round, and reactive to light. The fundi are  normal and spontaneous venous pulsations are present. Visual fields are full to finger confrontation. Extraocular movements are intact. Trigeminal sensation is intact and the muscles of mastication are normal. The face is symmetric. The palate elevates in the midline. Hearing intact. Voice is normal. Shoulder shrug is normal. The tongue has normal motion without fasciculations.    Motor Observation:  No asymmetry, no atrophy, and no involuntary movements noted. Tone:  Normal muscle tone.   Posture:  Posture is normal. normal erect   Strength:  Strength is V/V in the upper and lower limbs.       Assessment/Plan: 81 year old patient with multiple events that are suspicious for seizure activity. Workup was negative. Patient declines 3-day eeg video monitoring but says if she has another episode she will let me order it.  Daytime fatigue and morning headaches: sleep evaluation with Dr. Brett Fairy first Gevena Cotton  Stroke: Continue Plavix and statin for stroke prevention. Follow up with PCP for management of risk factors. LDL goal < 70.  Seizure: Routine EEG normal, consider 3-day ambulatory if has another episode  A total of 30 minutes was spent with this patient. Over half this time was spent on counseling patient on the diagnosis and different therapeutic options available.   Current smoker: highly recommend cessation.  Sarina Ill,  MD  [facility] [address] [place], [place] [address]  Phone [phone] Fax [phone]  A total of 30 minutes was spent face-to-face with this patient. Over half this time was spent on counseling patient on the syncope and OSA diagnosis and different diagnostic and therapeutic options available.

## 2015-04-28 NOTE — Patient Instructions (Signed)
Remember to drink plenty of fluid, eat healthy meals and do not skip any meals. Try to eat protein with a every meal and eat a healthy snack such as fruit or nuts in between meals. Try to keep a regular sleep-wake schedule and try to exercise daily, particularly in the form of walking, 20-30 minutes a day, if you can.   Sleep evaluation  I would like to see you back as needed, sooner if we need to. Please call us with any interim questions, concerns, problems, updates or refill requests.   Please also call us for any test results so we can go over those with you on the phone.  My clinical assistant and will answer any of your questions and relay your messages to me and also relay most of my messages to you.   Our phone number is [phone]. We also have an after hours call service for urgent matters and there is a physician on-call for urgent questions. For any emergencies you know to call 911 or go to the nearest emergency room

## 2015-05-17 ENCOUNTER — Telehealth: Payer: Self-pay | Admitting: *Deleted

## 2015-05-17 DIAGNOSIS — C50012 Malignant neoplasm of nipple and areola, left female breast: Secondary | ICD-10-CM

## 2015-05-17 MED ORDER — TAMOXIFEN CITRATE 20 MG PO TABS: 20.0000 mg | ORAL_TABLET | Freq: Every day | ORAL | Status: AC

## 2015-05-17 NOTE — Telephone Encounter (Signed)
Patient called reporting she is out of the Tamoxifen.  Request refill be sent to [facility].  Next F/u will be 04-20-2016.  This nurse sent refill to [facility].

## 2015-05-24 ENCOUNTER — Encounter: Payer: Self-pay | Admitting: Neurology

## 2015-05-24 ENCOUNTER — Ambulatory Visit (INDEPENDENT_AMBULATORY_CARE_PROVIDER_SITE_OTHER): Payer: Medicare Other | Admitting: Neurology

## 2015-05-24 VITALS — BP 132/84 | HR 94 | Resp 20 | Ht 60.0 in | Wt 95.0 lb

## 2015-05-24 DIAGNOSIS — T732XXA Exhaustion due to exposure, initial encounter: Secondary | ICD-10-CM

## 2015-05-24 DIAGNOSIS — R634 Abnormal weight loss: Secondary | ICD-10-CM

## 2015-05-24 DIAGNOSIS — J441 Chronic obstructive pulmonary disease with (acute) exacerbation: Secondary | ICD-10-CM | POA: Insufficient documentation

## 2015-05-24 DIAGNOSIS — R413 Other amnesia: Secondary | ICD-10-CM

## 2015-05-24 NOTE — Progress Notes (Signed)
[facility]    Provider:  Dr Brett Fairy.  Referring Provider: Derinda Late, MD Primary Care Physician:  Marylene Land, MD  Interval Update 04/28/2015 by Dr. Jaynee Eagles,    Mrs. Ruth [name] reports that she has felt an increasing daytime sleepiness and fatigue. Asked if this interferes with her activities of daily living she  Stated that it begins to. She endorsed the Epworth sleepiness score at only 2 points.  She feels more fatigued than sleepy. Her primary care physician, [name], advised a a sleep study may be indicated. The patient usually goes to bed between 10 and 11 and falls asleep promptly. She will sleep through the night except for one bathroom break, until she has to rise in the morning which is usually about 8 or 9 AM. She does not feel refreshed or restored in the morning she reports. She had one or 2 headaches in the morning. But not frequently and she never had them before this year. The headaches wake her from sleep and she usually will get up and get some ibuprofen or aspirin. There are dull at the left temple, but they're not described as sharp stabbing or the hot poker headache. She will sometimes nap in daytime, but is not a regular occurrence and her last 1-2 hours. An afternoon nap does not affect her nighttime sleep quality or duration. She reports that she cannot recall dreaming. Mr. and Mrs. Reed does wear hearing aids, and I'm not aware of each others sleep sounds. Her husband is not sure that she snores or if he has ever witnessed apnea. She wheezes often. Mr Cuartas snores when sleeping on his left side. He has an AV fistula placed in his left arm in preparation for hemodialysis.  Mrs. Housden describes as a feeling of lack of energy to start her day to gather the strength to do something. This seems to be more fatigued than actual sleepiness.      HPI: Ruth [name] is a 81 y.o. female here as a referral from [name] for Syncopal episodes vs TIA. PMHx  COPD, Impaired glucose tolerance, bronchitis, HLD, SCC cervix, 2008 breast cancer, osteoporosis. She has had a stroke, she is on Plavix and a statin.   Few weeks ago she went to the kitchen and something happened, her right arm felt weak. She slid down the cabinets and wacked her head. She just layed there. She doesn't remember bumping her head or getting on the floor, but she just layed there until she could get up. Maybe 5 minutes doesn't really know. Patient was admitted to the ED and MRi did not show anything acute.   This has happened before, she has fallen and lost consciousness. One time, husband says she was answering him but she doesn't remember the incident, this was months ago sometime around easter. Another time she was taking a shower and she became altered, started talking "gibberish", she was in "[place]" and daughter called husband(per review of notes, her eyes rolled to the left and afterwards she slept for several hours). Patient woke up in the ambulance. She was non responsive for 5 minutes. She had no idea what was going on (she was still confused when she got to the ED, per Notes reviewed). She has had one big event but possibly multiple other smaller events when she was alone. No urine loss, no tongue biting. Mother died of a stroke but no personal or family history of a seizures. Unknown triggers, denies etoh, denies drugs.  No abnormal movements reported when she was non responsive.   Reviewed notes, labs and imaging from outside physicians, which showed: MRI of the brain w/wo w eeks ago and mri brain w/o contrast in June did not show acute events, (personally reviewed images) showed age-related atrophy and chronic non-specific WM changes. MRA of the head unremarkable but did note a subacute cva possibly. EEG in June 2015 was normal. TSH nml, last CMP with AKI, last CBC unremarkable, hgba1c 5.7. Of note, on review of records it does appear that patient lost control of her bowels when  she had the incident in June. 24 hour holter was placed on 12/9. Carotid duplex was 1-39% bilaterally.  Review of Systems: Patient complains of symptoms per HPI as well as the following symptoms: memory loss, incontinence. Pertinent negatives per HPI. All others negative. FSS 45 , EDS per Epworth 2.   Social History   Social History  . Marital Status: Married    Spouse Name: N/A  . Number of Children: N/A  . Years of Education: N/A   Occupational History  . Not on file.   Social History Main Topics  . Smoking status: Current Every Day Smoker -- 1.50 packs/day for 61 years    Types: Cigarettes  . Smokeless tobacco: Never Used  . Alcohol Use: 0.0 oz/week    0 Standard drinks or equivalent per week     Comment: rarely  . Drug Use: No  . Sexual Activity: No   Other Topics Concern  . Not on file   Social History Narrative    Family History  Problem Relation Age of Onset  . Hypertension Mother   . Hypertension Father   . Diabetes Father   . Heart disease Father   . Deep vein thrombosis Sister   . Diabetes Daughter   . Hyperlipidemia Daughter   . Diabetes Son   . Hyperlipidemia Son   . Seizures Neg Hx     Past Medical History  Diagnosis Date  . Osteoporosis, unspecified 04/01/2013  . Hyperlipidemia 04/01/2013  . Hearing loss 04/01/2013  . Cancer ([place])     cervical / ovarian   . AAA (abdominal aortic aneurysm) ([place])   . COPD (chronic obstructive pulmonary disease) ([place])   . Stroke ([place])   . Syncope and collapse   . Breast cancer ([place])   . Ovarian cancer ([place])   . Skin cancer     Past Surgical History  Procedure Laterality Date  . Abdominal hysterectomy    . Cholecystectomy    . Ovarian cyst removed    . Cholecystectomy    . Breast lumpectomy      Current Outpatient Prescriptions  Medication Sig Dispense Refill  . ALENDRONATE SODIUM PO Take 1 tablet by mouth once a week.    . calcium carbonate (TUMS EX) 750 MG chewable tablet Chew 2 tablets by mouth  daily.    . cholecalciferol (VITAMIN D) 1000 UNITS tablet Take 1 tablet (1,000 Units total) by mouth daily. 90 tablet 4  . clopidogrel (PLAVIX) 75 MG tablet Take 1 tablet (75 mg total) by mouth daily. 30 tablet 11  . fish oil-omega-3 fatty acids 1000 MG capsule Take 1 g by mouth 2 (two) times daily.    . pravastatin (PRAVACHOL) 10 MG tablet Take 10 mg by mouth daily.    . tamoxifen (NOLVADEX) 20 MG tablet Take 1 tablet (20 mg total) by mouth daily. 90 tablet 3  . tiotropium (SPIRIVA) 18 MCG inhalation capsule Place 18  mcg into inhaler and inhale daily.     No current facility-administered medications for this visit.    Allergies as of 05/24/2015  . (No Known Allergies)    Vitals: BP 132/84 mmHg  Pulse 94  Resp 20  Ht 5' (1.524 m)  Wt 95 lb (43.092 kg)  BMI 18.55 kg/m2 Last Weight:  Wt Readings from Last 1 Encounters:  05/24/15 95 lb (43.092 kg)   Last Height:   Ht Readings from Last 1 Encounters:  05/24/15 5' (1.524 m)     General: The patient is awake, alert and appears not in acute distress. The patient is well groomed. Head: Normocephalic, atraumatic. Neck is supple. Mallampati  neck circumference:13" , no retrognathia.  Cardiovascular:  Regular rate and rhythm , without  murmurs or carotid bruit, and without distended neck veins. Respiratory: Lungs are clear to auscultation. Skin:  Without evidence of edema, or rash Trunk: BMI is elevated and patient  has normal posture.   Neurologic exam : The patient is awake and alert, oriented to place and time.   Memory subjective described as intact. There is a normal attention span & concentration ability.  Speech is fluent without dysarthria, dysphonia or aphasia. Mood and affect are appropriate.  Cranial nerves: Pupils are equal and briskly reactive to light. Extraocular movements  in vertical and horizontal planes intact and without nystagmus. Visual fields by finger perimetry are intact. Hearing to finger rub intact.   Facial sensation intact to fine touch. Facial motor strength is symmetric and tongue and uvula move midline. Motor exam:  Normal tone and normal muscle bulk and symmetric normal strength in all extremities. Sensory:  Fine touch, pinprick and vibration were tested in all extremities. Proprioception is normal. Coordination: Rapid alternating movements in the fingers/hands is tested and normal.  Finger-to-nose maneuver tested and normal without evidence of ataxia, dysmetria or tremor.  Gait and station: Patient walks without assistive device and is able and assisted stool climb up to the exam table.  Strength within normal limits.  Stance is stable and normal.  Tandem gait is , turns with 3 Steps are unfragmented. Romberg.  Deep tendon reflexes: in the upper and lower extremities are symmetric and intact.  Assessment/Plan:   81 year old female patient with multiple events that are suspicious for seizure activity.  Workup was negative.  Patient declined a 3 day EEG but will have EEG while on EEG.   Daytime fatigue and morning headaches: sleep evaluation by PSG first available-  COPD and OSA overlap syndrome is suspected.  Stroke: Continue Plavix and statin for stroke prevention.  Follow up with PCP for management of risk factors. LDL goal < 70.  OSA evaluation as part of neck and a repeat stroke prevention.  A total of 40 minutes was spent with this patient.  Over half this time was spent on counseling patient on the diagnosis and different therapeutic options available.    COPD in a current smoker: highly recommend the cessation.   Larey Seat, [facility] [address] [place], [place] [address]  Phone [phone] Fax [phone]  A total of 40 minutes was spent face-to-face with this patient.  Over half this time was spent on counseling patient on the syncope and OSA diagnosis and different diagnostic and therapeutic options  available.

## 2015-05-24 NOTE — Patient Instructions (Signed)
Smoking Cessation, Tips for Success If you are ready to quit smoking, congratulations! You have chosen to help yourself be healthier. Cigarettes bring nicotine, tar, carbon monoxide, and other irritants into your body. Your lungs, heart, and blood vessels will be able to work better without these poisons. There are many different ways to quit smoking. Nicotine gum, nicotine patches, a nicotine inhaler, or nicotine nasal spray can help with physical craving. Hypnosis, support groups, and medicines help break the habit of smoking. WHAT THINGS CAN I DO TO MAKE QUITTING EASIER?  Here are some tips to help you quit for good:  Pick a date when you will quit smoking completely. Tell all of your friends and family about your plan to quit on that date.  Do not try to slowly cut down on the number of cigarettes you are smoking. Pick a quit date and quit smoking completely starting on that day.  Throw away all cigarettes.   Clean and remove all ashtrays from your home, work, and car.  On a card, write down your reasons for quitting. Carry the card with you and read it when you get the urge to smoke.  Cleanse your body of nicotine. Drink enough water and fluids to keep your urine clear or pale yellow. Do this after quitting to flush the nicotine from your body.  Learn to predict your moods. Do not let a bad situation be your excuse to have a cigarette. Some situations in your life might tempt you into wanting a cigarette.  Never have "just one" cigarette. It leads to wanting another and another. Remind yourself of your decision to quit.  Change habits associated with smoking. If you smoked while driving or when feeling stressed, try other activities to replace smoking. Stand up when drinking your coffee. Brush your teeth after eating. Sit in a different chair when you read the paper. Avoid alcohol while trying to quit, and try to drink fewer caffeinated beverages. Alcohol and caffeine may urge you to  smoke.  Avoid foods and drinks that can trigger a desire to smoke, such as sugary or spicy foods and alcohol.  Ask people who smoke not to smoke around you.  Have something planned to do right after eating or having a cup of coffee. For example, plan to take a walk or exercise.  Try a relaxation exercise to calm you down and decrease your stress. Remember, you may be tense and nervous for the first 2 weeks after you quit, but this will pass.  Find new activities to keep your hands busy. Play with a pen, coin, or rubber band. Doodle or draw things on paper.  Brush your teeth right after eating. This will help cut down on the craving for the taste of tobacco after meals. You can also try mouthwash.   Use oral substitutes in place of cigarettes. Try using lemon drops, carrots, cinnamon sticks, or chewing gum. Keep them handy so they are available when you have the urge to smoke.  When you have the urge to smoke, try deep breathing.  Designate your home as a nonsmoking area.  If you are a heavy smoker, ask your health care provider about a prescription for nicotine chewing gum. It can ease your withdrawal from nicotine.  Reward yourself. Set aside the cigarette money you save and buy yourself something nice.  Look for support from others. Join a support group or smoking cessation program. Ask someone at home or at work to help you with your plan   to quit smoking.  Always ask yourself, "Do I need this cigarette or is this just a reflex?" Tell yourself, "Today, I choose not to smoke," or "I do not want to smoke." You are reminding yourself of your decision to quit.  Do not replace cigarette smoking with electronic cigarettes (commonly called e-cigarettes). The safety of e-cigarettes is unknown, and some may contain harmful chemicals.  If you relapse, do not give up! Plan ahead and think about what you will do the next time you get the urge to smoke. HOW WILL I FEEL WHEN I QUIT SMOKING? You  may have symptoms of withdrawal because your body is used to nicotine (the addictive substance in cigarettes). You may crave cigarettes, be irritable, feel very hungry, cough often, get headaches, or have difficulty concentrating. The withdrawal symptoms are only temporary. They are strongest when you first quit but will go away within 10-14 days. When withdrawal symptoms occur, stay in control. Think about your reasons for quitting. Remind yourself that these are signs that your body is healing and getting used to being without cigarettes. Remember that withdrawal symptoms are easier to treat than the major diseases that smoking can cause.  Even after the withdrawal is over, expect periodic urges to smoke. However, these cravings are generally short lived and will go away whether you smoke or not. Do not smoke! WHAT RESOURCES ARE AVAILABLE TO HELP ME QUIT SMOKING? Your health care provider can direct you to community resources or hospitals for support, which may include:  Group support.  Education.  Hypnosis.  Therapy.   This information is not intended to replace advice given to you by your health care provider. Make sure you discuss any questions you have with your health care provider.   Document Released: 03/10/2004 Document Revised: 07/03/2014 Document Reviewed: 11/28/2012 Elsevier Interactive Patient Education 2016 [employer] Inc.  

## 2015-05-28 ENCOUNTER — Ambulatory Visit (INDEPENDENT_AMBULATORY_CARE_PROVIDER_SITE_OTHER): Payer: Medicare Other | Admitting: Neurology

## 2015-05-28 DIAGNOSIS — J441 Chronic obstructive pulmonary disease with (acute) exacerbation: Secondary | ICD-10-CM

## 2015-05-28 DIAGNOSIS — R634 Abnormal weight loss: Secondary | ICD-10-CM

## 2015-05-28 DIAGNOSIS — R413 Other amnesia: Secondary | ICD-10-CM

## 2015-05-28 DIAGNOSIS — R0902 Hypoxemia: Secondary | ICD-10-CM

## 2015-05-28 DIAGNOSIS — T732XXA Exhaustion due to exposure, initial encounter: Secondary | ICD-10-CM

## 2015-05-29 NOTE — Sleep Study (Signed)
Please see the scanned sleep study interpretation located in the procedure tab within the chart review section.   

## 2015-06-02 ENCOUNTER — Telehealth: Payer: Self-pay

## 2015-06-02 DIAGNOSIS — R0902 Hypoxemia: Secondary | ICD-10-CM

## 2015-06-02 NOTE — Telephone Encounter (Signed)
Spoke to pt and advised her that her study did not reveal osa but it did reveal severe hypoxemia and treatment is advised. Dr. Brett Fairy advises her to proceed with a pulmonary consultation. I advised her to avoid sedative-hypnotics and to stop smoking. I also advised her to not drive or operate machinery while sleepy. Pt says that she is always sleepy. I advised her that we will order a pulmonary consultation with the goal of oxygen therapy to hopefully help her headaches and their office will call her to set up the appt. Pt verbalized understanding.

## 2015-06-10 ENCOUNTER — Ambulatory Visit (INDEPENDENT_AMBULATORY_CARE_PROVIDER_SITE_OTHER)
Admission: RE | Admit: 2015-06-10 | Discharge: 2015-06-10 | Disposition: A | Discharge status: Home / Self Care | Payer: Medicare Other | Source: Ambulatory Visit | Attending: Pulmonary Disease | Admitting: Pulmonary Disease

## 2015-06-10 ENCOUNTER — Encounter: Payer: Self-pay | Admitting: Pulmonary Disease

## 2015-06-10 ENCOUNTER — Ambulatory Visit (INDEPENDENT_AMBULATORY_CARE_PROVIDER_SITE_OTHER): Payer: Medicare Other | Admitting: Pulmonary Disease

## 2015-06-10 VITALS — BP 122/84 | HR 88 | Ht 60.0 in | Wt 95.4 lb

## 2015-06-10 DIAGNOSIS — R0600 Dyspnea, unspecified: Secondary | ICD-10-CM | POA: Diagnosis not present

## 2015-06-10 NOTE — Progress Notes (Signed)
Past medical history She  has a past medical history of Osteoporosis, unspecified (04/01/2013); Hyperlipidemia (04/01/2013); Hearing loss (04/01/2013); Cancer [facility]); AAA (abdominal aortic aneurysm) ([place]); COPD (chronic obstructive pulmonary disease) ([place]); Stroke [facility]); Syncope and collapse; Breast cancer ([place]); Ovarian cancer ([place]); and Skin cancer.  Past surgical history She  has past surgical history that includes Abdominal hysterectomy; Cholecystectomy; ovarian cyst removed; Cholecystectomy; and Breast lumpectomy.  Family history Her family history includes Deep vein thrombosis in her sister; Diabetes in her daughter, father, and son; Heart disease in her father; Hyperlipidemia in her daughter and son; Hypertension in her father and mother. There is no history of Seizures.  Social history She  reports that she has been smoking Cigarettes.  She started smoking about 63 years ago. She has a 91.5 pack-year smoking history. She has never used smokeless tobacco. She reports that she drinks alcohol. She reports that she does not use illicit drugs.  No Known Allergies  Current Outpatient Prescriptions on File Prior to Visit  Medication Sig  . ALENDRONATE SODIUM PO Take 1 tablet by mouth once a week.  . calcium carbonate (TUMS EX) 750 MG chewable tablet Chew 2 tablets by mouth daily.  . cholecalciferol (VITAMIN D) 1000 UNITS tablet Take 1 tablet (1,000 Units total) by mouth daily.  . clopidogrel (PLAVIX) 75 MG tablet Take 1 tablet (75 mg total) by mouth daily.  . fish oil-omega-3 fatty acids 1000 MG capsule Take 1 g by mouth daily.   . pravastatin (PRAVACHOL) 10 MG tablet Take 10 mg by mouth daily.  . tamoxifen (NOLVADEX) 20 MG tablet Take 1 tablet (20 mg total) by mouth daily.  Marland Kitchen tiotropium (SPIRIVA) 18 MCG inhalation capsule Place 18 mcg into inhaler and inhale daily.   No current facility-administered medications on file prior to visit.    Chief Complaint  Patient presents with  . CONSULT     Referred by Dr Brett Fairy for Hypoxia- sleep study 05/28/15.      Tests PSG 05/28/15 >> AHI 5.2, SaO2 low 86%, spent 7.3 min with SpO2 < 88%  Vital signs BP 122/84 mmHg  Pulse 88  Ht 5' (1.524 m)  Wt 95 lb 6.4 oz (43.273 kg)  BMI 18.63 kg/m2  SpO2 95%  History of present illness Ruth [name] is a 81 y.o. female smoker for evaluation of nocturnal hypoxemia.  She was seen recently by Dr. Salomon Fick Dohmeier for evaluation of daytime sleepiness.  She had sleep study which showed borderline sleep apnea with AHI 5.2 and SaO2 low 86%.  She had 7.3 min with SpO2 < 88%.  She was referred to pulmonary to further assess nocturnal hypoxia.  She has hx of COPD and still smokes cigarettes.  CXR from 11/25/13 showed hyperinflation.  She is from [place], but has lived in [place] for 40 yrs.  She continues to smoke cigarettes.  She has COPD in her PMHx, but she was not aware of this.  She has intermittent cough with clear sputum.  She denies wheeze, chest pain, or hemoptysis.  She denies hx of pneumonia or exposure to TB.  She denies animal/bird exposure.  There is no hx of thromboembolic disease.  She has been using spiriva and this helps.  She was tx for C diff in 2015.  She lost about 20 lbs during this, and has not really regained the weight.  She has felt weak, and gets fatigued easily.  She can do okay walking on level ground.  She tries to avoid walking up  stairs.  Physical exam   General - No distress, thin ENT - No sinus tenderness, no oral exudate, no LAN, no thyromegaly, TM clear, pupils equal/reactive, wears glasses, upper dentures Cardiac - s1s2 regular, no murmur, pulses symmetric Chest - No wheeze/rales/dullness, good air entry, normal respiratory excursion Back - No focal tenderness Abd - Soft, non-tender, no organomegaly, + bowel sounds Ext - No edema, decreased muscle bulk Neuro - Normal strength, cranial nerves intact Skin - No rashes Psych - Normal mood, and behavior   Dg Lumbar  Spine Complete  10/21/2014  CLINICAL DATA:  Low back pain. Chronic. Fall 5 months ago. No recent fall. Initial evaluation. EXAM: LUMBAR SPINE - COMPLETE 4+ VIEW COMPARISON:  CT 11/25/2013. FINDINGS: Stable L1 compression fracture. Diffuse osteopenia and degenerative change. Aortoiliac atherosclerotic vascular disease. Abdominal aortic aneurysms again noted. Surgical clips in the pelvis. IMPRESSION: 1. Stable L1 compression fracture. No acute new compression fracture. 2. Diffuse osteopenia multilevel degenerative change. 3. Known abdominal aortic aneurysm again identified. Aneurysm previously identified on CT of 11/25/2013 . Given patient's history of low-back pain, aortic ultrasound can be obtained to evaluate for interval growth. Electronically Signed   By: Marcello Moores  Register   On: 10/21/2014 16:29    Lab Results  Component Value Date   WBC 7.3 04/20/2015   HGB 15.4 04/20/2015   HCT 46.6 04/20/2015   MCV 95.6 04/20/2015   PLT 207 04/20/2015    Lab Results  Component Value Date   CREATININE 1.1 04/20/2015   BUN 16.1 04/20/2015   NA 141 04/20/2015   K 4.1 04/20/2015   CL 99 06/22/2014   CO2 30* 04/20/2015    Lab Results  Component Value Date   ALT 9 04/20/2015   AST 17 04/20/2015   ALKPHOS 57 04/20/2015   BILITOT <0.30 04/20/2015    Lab Results  Component Value Date   TSH 0.772 06/22/2014     Discussion She has symptoms of fatigue and dyspnea on exertion.  This has progressed since was tx for C diff in 2015.  She also had significant weight loss, and loss of muscle bulk with her C diff episode.    She is a smoker, and has been dx with COPD (she was unaware of this).  She has been on spiriva and reports benefit from this.  She had recent sleep study which showed borderline OSA and mild oxygen desaturation.  My suspicion is that she has COPD/emphysema with nocturnal hypoxemia and deconditioning after episode of C diff.   Assessment/plan  Dyspnea with nocturnal hypoxemia  and dx of COPD with tobacco abuse. Plan: - chest xray today - will arrange for PFTs and ONO on room air - smoking cessation - continue spiriva  Weight loss. Plan: - f/u with PCP  Deconditioning. Plan: - might benefit from monitored exercise program   Patient Instructions  Chest xray today  Will schedule overnight oxygen test and pulmonary function test  Follow up in 4 to 6 weeks with Dr. Halford Chessman or Tammy Parrett     Chesley Mires, MD [facility] Pager:  [phone]

## 2015-06-10 NOTE — Progress Notes (Signed)
   Subjective:    Patient ID: Ruth [name], female    DOB: March 10, 1933, 81 y.o.   MRN: [id number]  HPI    Review of Systems  Constitutional: Negative for fever and unexpected weight change.  HENT: Negative for congestion, dental problem, ear pain, nosebleeds, postnasal drip, rhinorrhea, sinus pressure, sneezing, sore throat and trouble swallowing.   Eyes: Negative for redness and itching.  Respiratory: Positive for shortness of breath ( x 1 year). Negative for cough, chest tightness and wheezing.   Cardiovascular: Negative for palpitations and leg swelling.  Gastrointestinal: Negative for nausea and vomiting.  Genitourinary: Negative for dysuria.  Musculoskeletal: Negative for joint swelling.  Skin: Negative for rash.  Neurological: Negative for headaches.  Hematological: Does not bruise/bleed easily.  Psychiatric/Behavioral: Negative for dysphoric mood. The patient is not nervous/anxious.        Objective:   Physical Exam        Assessment & Plan:

## 2015-06-10 NOTE — Patient Instructions (Signed)
Chest xray today  Will schedule overnight oxygen test and pulmonary function test  Follow up in 4 to 6 weeks with Dr. Halford Chessman or [facility]

## 2015-06-11 ENCOUNTER — Telehealth: Payer: Self-pay | Admitting: Pulmonary Disease

## 2015-06-11 NOTE — Telephone Encounter (Signed)
LMTCB X1 

## 2015-06-11 NOTE — Telephone Encounter (Signed)
Dg Chest 2 View  06/10/2015  CLINICAL DATA:  Pt c/o SOB. Hx of AAA, COPD. Smoker. EXAM: CHEST  2 VIEW COMPARISON:  11/25/2013 FINDINGS: moderate thoracic spondylosis. Midline trachea. Normal heart size with a tortuous descending thoracic aorta wound. No pleural effusion or pneumothorax. Surgical clips which project over the left chest. Mild biapical pleural thickening. Hyperinflation. IMPRESSION: Hyperinflation, without acute disease. Electronically Signed   By: Abigail Miyamoto M.D.   On: 06/10/2015 17:22    Will have my nurse inform pt that CXR shows changes consistent with COPD.

## 2015-06-14 NOTE — Telephone Encounter (Signed)
Spoke with pt's husband.  Discussed below cxr results per Dr. Halford Chessman.  He verbalized understanding, voiced no further questions at this time, and states he will inform pt.

## 2015-06-14 NOTE — Progress Notes (Signed)
I thank Dr Halford Chessman for the  assessment and plan as directed by him .The patient is known to me .   Neva Ramaswamy, MD

## 2015-06-20 ENCOUNTER — Emergency Department (HOSPITAL_COMMUNITY)
Admission: EM | Admit: 2015-06-20 | Discharge: 2015-06-20 | Disposition: A | Discharge status: Home / Self Care | Payer: Medicare Other | Attending: Emergency Medicine | Admitting: Emergency Medicine

## 2015-06-20 ENCOUNTER — Encounter (HOSPITAL_COMMUNITY): Payer: Self-pay | Admitting: Vascular Surgery

## 2015-06-20 ENCOUNTER — Emergency Department (HOSPITAL_COMMUNITY): Payer: Medicare Other

## 2015-06-20 DIAGNOSIS — Z79899 Other long term (current) drug therapy: Secondary | ICD-10-CM | POA: Insufficient documentation

## 2015-06-20 DIAGNOSIS — Z8673 Personal history of transient ischemic attack (TIA), and cerebral infarction without residual deficits: Secondary | ICD-10-CM | POA: Diagnosis not present

## 2015-06-20 DIAGNOSIS — E785 Hyperlipidemia, unspecified: Secondary | ICD-10-CM | POA: Diagnosis not present

## 2015-06-20 DIAGNOSIS — R51 Headache: Secondary | ICD-10-CM | POA: Diagnosis not present

## 2015-06-20 DIAGNOSIS — Z85828 Personal history of other malignant neoplasm of skin: Secondary | ICD-10-CM | POA: Insufficient documentation

## 2015-06-20 DIAGNOSIS — F17210 Nicotine dependence, cigarettes, uncomplicated: Secondary | ICD-10-CM | POA: Diagnosis not present

## 2015-06-20 DIAGNOSIS — J441 Chronic obstructive pulmonary disease with (acute) exacerbation: Secondary | ICD-10-CM | POA: Insufficient documentation

## 2015-06-20 DIAGNOSIS — Z8543 Personal history of malignant neoplasm of ovary: Secondary | ICD-10-CM | POA: Diagnosis not present

## 2015-06-20 DIAGNOSIS — Z853 Personal history of malignant neoplasm of breast: Secondary | ICD-10-CM | POA: Diagnosis not present

## 2015-06-20 DIAGNOSIS — R059 Cough, unspecified: Secondary | ICD-10-CM

## 2015-06-20 DIAGNOSIS — Z8541 Personal history of malignant neoplasm of cervix uteri: Secondary | ICD-10-CM | POA: Insufficient documentation

## 2015-06-20 DIAGNOSIS — M810 Age-related osteoporosis without current pathological fracture: Secondary | ICD-10-CM | POA: Diagnosis not present

## 2015-06-20 DIAGNOSIS — H9190 Unspecified hearing loss, unspecified ear: Secondary | ICD-10-CM | POA: Insufficient documentation

## 2015-06-20 DIAGNOSIS — R0602 Shortness of breath: Secondary | ICD-10-CM | POA: Diagnosis present

## 2015-06-20 DIAGNOSIS — Z7902 Long term (current) use of antithrombotics/antiplatelets: Secondary | ICD-10-CM | POA: Insufficient documentation

## 2015-06-20 DIAGNOSIS — R05 Cough: Secondary | ICD-10-CM

## 2015-06-20 LAB — BASIC METABOLIC PANEL
Anion gap: 9 (ref 5–15)
BUN: 16 mg/dL (ref 6–20)
CALCIUM: 9.4 mg/dL (ref 8.9–10.3)
CHLORIDE: 102 mmol/L (ref 101–111)
CO2: 30 mmol/L (ref 22–32)
CREATININE: 1.23 mg/dL — AB (ref 0.44–1.00)
GFR calc non Af Amer: 40 mL/min — ABNORMAL LOW (ref >60)
GFR, EST AFRICAN AMERICAN: 47 mL/min — AB (ref >60)
Glucose, Bld: 119 mg/dL — ABNORMAL HIGH (ref 65–99)
Potassium: 4.2 mmol/L (ref 3.5–5.1)
SODIUM: 141 mmol/L (ref 135–145)

## 2015-06-20 LAB — I-STAT TROPONIN, ED: TROPONIN I, POC: 0 ng/mL (ref 0.00–0.08)

## 2015-06-20 LAB — CBC
HEMATOCRIT: 46.5 % — AB (ref 36.0–46.0)
HEMOGLOBIN: 15.2 g/dL — AB (ref 12.0–15.0)
MCH: 31.9 pg (ref 26.0–34.0)
MCHC: 32.7 g/dL (ref 30.0–36.0)
MCV: 97.5 fL (ref 78.0–100.0)
Platelets: 184 10*3/uL (ref 150–400)
RBC: 4.77 MIL/uL (ref 3.87–5.11)
RDW: 13.6 % (ref 11.5–15.5)
WBC: 7.5 10*3/uL (ref 4.0–10.5)

## 2015-06-20 MED ORDER — IPRATROPIUM-ALBUTEROL 0.5-2.5 (3) MG/3ML IN SOLN
3.0000 mL | Freq: Once | RESPIRATORY_TRACT | Status: AC
  Administered 2015-06-20: 3 mL via RESPIRATORY_TRACT
  Filled 2015-06-20: qty 3

## 2015-06-20 MED ORDER — PREDNISONE 10 MG PO TABS: 50.0000 mg | ORAL_TABLET | Freq: Every day | ORAL | Status: AC

## 2015-06-20 MED ORDER — ALBUTEROL SULFATE (2.5 MG/3ML) 0.083% IN NEBU
2.5000 mg | INHALATION_SOLUTION | Freq: Once | RESPIRATORY_TRACT | Status: AC
Start: 2015-06-20 — End: 2015-06-20
  Administered 2015-06-20: 2.5 mg via RESPIRATORY_TRACT
  Filled 2015-06-20: qty 3

## 2015-06-20 MED ORDER — ALBUTEROL SULFATE HFA 108 (90 BASE) MCG/ACT IN AERS
2.0000 | INHALATION_SPRAY | Freq: Once | RESPIRATORY_TRACT | Status: AC
  Administered 2015-06-20: 2 via RESPIRATORY_TRACT
  Filled 2015-06-20: qty 6.7

## 2015-06-20 MED ORDER — PREDNISONE 20 MG PO TABS
60.0000 mg | ORAL_TABLET | Freq: Once | ORAL | Status: AC
  Administered 2015-06-20: 60 mg via ORAL
  Filled 2015-06-20: qty 3

## 2015-06-20 MED ORDER — ACETAMINOPHEN 325 MG PO TABS
325.0000 mg | ORAL_TABLET | Freq: Once | ORAL | Status: AC
  Administered 2015-06-20: 325 mg via ORAL
  Filled 2015-06-20: qty 1

## 2015-06-20 NOTE — ED Provider Notes (Signed)
CSN: [id number]     Arrival date & time 06/20/15  1645 History   First MD Initiated Contact with Patient 06/20/15 1727     Chief Complaint  Patient presents with  . Cough  . Shortness of Breath     (Consider location/radiation/quality/duration/timing/severity/associated sxs/prior Treatment) Patient is a 81 y.o. female presenting with shortness of breath. The history is provided by the patient.  Shortness of Breath Severity:  Moderate Onset quality:  Gradual Duration:  2 days Timing:  Constant Progression:  Worsening Chronicity:  Recurrent Context comment:  History of COPD Relieved by:  Nothing Worsened by:  Exertion Ineffective treatments:  None tried Associated symptoms: cough and headaches   Associated symptoms: no abdominal pain, no chest pain, no claudication, no diaphoresis, no ear pain, no fever, no hemoptysis, no neck pain, no rash, no sore throat, no sputum production, no vomiting and no wheezing   Risk factors: tobacco use   Risk factors: no obesity and no prolonged immobilization     Past Medical History  Diagnosis Date  . Osteoporosis, unspecified 04/01/2013  . Hyperlipidemia 04/01/2013  . Hearing loss 04/01/2013  . Cancer ([place])     cervical / ovarian   . AAA (abdominal aortic aneurysm) ([place])   . COPD (chronic obstructive pulmonary disease) ([place])   . Stroke ([place])   . Syncope and collapse   . Breast cancer ([place])   . Ovarian cancer ([place])   . Skin cancer    Past Surgical History  Procedure Laterality Date  . Abdominal hysterectomy    . Cholecystectomy    . Ovarian cyst removed    . Cholecystectomy    . Breast lumpectomy     Family History  Problem Relation Age of Onset  . Hypertension Mother   . Hypertension Father   . Diabetes Father   . Heart disease Father   . Deep vein thrombosis Sister   . Diabetes Daughter   . Hyperlipidemia Daughter   . Diabetes Son   . Hyperlipidemia Son   . Seizures Neg Hx    Social History  Substance Use Topics  .  Smoking status: Current Every Day Smoker -- 1.50 packs/day for 61 years    Types: Cigarettes    Start date: 06/27/1949  . Smokeless tobacco: Never Used  . Alcohol Use: 0.0 oz/week    0 Standard drinks or equivalent per week     Comment: rarely   OB History    No data available     Review of Systems  Constitutional: Negative for fever and diaphoresis.  HENT: Negative for ear pain and sore throat.   Respiratory: Positive for cough and shortness of breath. Negative for hemoptysis, sputum production and wheezing.   Cardiovascular: Negative for chest pain and claudication.  Gastrointestinal: Negative for vomiting and abdominal pain.  Genitourinary: Negative for dysuria.  Musculoskeletal: Negative for neck pain.  Skin: Negative for rash.  Neurological: Positive for headaches.      Allergies  Review of patient's allergies indicates no known allergies.  Home Medications   Prior to Admission medications   Medication Sig Start Date End Date Taking? Authorizing Provider  ALENDRONATE SODIUM PO Take 1 tablet by mouth every 7 (seven) days. Take on Mondays   Yes Historical Provider, MD  calcium carbonate (TUMS EX) 750 MG chewable tablet Chew 2 tablets by mouth daily.   Yes Historical Provider, MD  cholecalciferol (VITAMIN D) 1000 UNITS tablet Take 1 tablet (1,000 Units total) by mouth daily. 04/20/15  Yes Sarajane Jews  C Magrinat, MD  clopidogrel (PLAVIX) 75 MG tablet Take 1 tablet (75 mg total) by mouth daily. 04/01/15  Yes Melvenia Beam, MD  fish oil-omega-3 fatty acids 1000 MG capsule Take 1 g by mouth daily.    Yes Historical Provider, MD  ibuprofen (ADVIL,MOTRIN) 200 MG tablet Take 200 mg by mouth every 6 (six) hours as needed for moderate pain.   Yes Historical Provider, MD  pravastatin (PRAVACHOL) 10 MG tablet Take 10 mg by mouth daily.   Yes Historical Provider, MD  tamoxifen (NOLVADEX) 20 MG tablet Take 1 tablet (20 mg total) by mouth daily. 05/17/15  Yes Chauncey Cruel, MD    tiotropium (SPIRIVA) 18 MCG inhalation capsule Place 18 mcg into inhaler and inhale daily.   Yes Historical Provider, MD  predniSONE (DELTASONE) 10 MG tablet Take 5 tablets (50 mg total) by mouth daily. 06/20/15 06/24/15  Esaw Grandchild, MD   BP 121/92 mmHg  Pulse 90  Temp(Src) 97.7 F (36.5 C) (Oral)  Resp 10  SpO2 100% Physical Exam  Constitutional: She is oriented to person, place, and time. She appears well-developed and well-nourished. No distress.  HENT:  Head: Head is without abrasion and without contusion.  Mouth/Throat: Uvula is midline, oropharynx is clear and moist and mucous membranes are normal.  Eyes: Conjunctivae and EOM are normal. Pupils are equal, round, and reactive to light.  Cardiovascular: Normal rate, regular rhythm and normal pulses.   No murmur heard. Pulmonary/Chest: Effort normal and breath sounds normal. No tachypnea and no bradypnea.    Abdominal: Normal appearance. There is no tenderness. There is no rigidity, no guarding, no CVA tenderness, no tenderness at McBurney's point and negative Murphy's sign.  Neurological: She is alert and oriented to person, place, and time. No cranial nerve deficit or sensory deficit. GCS eye subscore is 4. GCS verbal subscore is 5. GCS motor subscore is 6.  Skin: No abrasion and no rash noted. No erythema.  Psychiatric: She has a normal mood and affect. Her speech is normal and behavior is normal. Cognition and memory are normal.    ED Course  Procedures (including critical care time) Labs Review Labs Reviewed  BASIC METABOLIC PANEL - Abnormal; Notable for the following:    Glucose, Bld 119 (*)    Creatinine, Ser 1.23 (*)    GFR calc non Af Amer 40 (*)    GFR calc Af Amer 47 (*)    All other components within normal limits  CBC - Abnormal; Notable for the following:    Hemoglobin 15.2 (*)    HCT 46.5 (*)    All other components within normal limits  I-STAT TROPOININ, ED    Imaging Review Dg Chest 2  View  06/20/2015  CLINICAL DATA:  Cough, headache, weakness shortness of breath EXAM: CHEST  2 VIEW COMPARISON:  06/10/2015 FINDINGS: Chronic hyperinflation secondary to COPD/ emphysema. Normal heart size and vascularity. No focal pneumonia, collapse or consolidation. Artifact overlies the left upper lobe. Negative for edema, effusion or pneumothorax. Trachea midline. Degenerative changes of the spine. Chronic compression fracture at approximately L1. No significant interval change. IMPRESSION: Stable hyperinflation.  No superimposed acute process. Electronically Signed   By: Jerilynn Mages.  Shick M.D.   On: 06/20/2015 17:56   I have personally reviewed and evaluated these images and lab results as part of my medical decision-making.   EKG Interpretation   Date/Time:  Sunday June 20 2015 16:58:32 EST Ventricular Rate:  109 PR Interval:  158 QRS Duration: 72  QT Interval:  328 QTC Calculation: 441 R Axis:   85 Text Interpretation:  Sinus tachycardia Biatrial enlargement Anterior  infarct , age undetermined Abnormal ECG When compared with ECG of  11/25/2013, No significant change was found Confirmed by [facility]  MD, DAVID  ([id number]) on 06/20/2015 6:59:28 PM      MDM   Final diagnoses:  COPD exacerbation (HCC)  Cough  SOB (shortness of breath)    81 year old Caucasian female with past medical history of COPD presents in setting of cough and shortness of breath. Patient reports this is been going on for several days but worsening today. Patient noted by family during Christmas get-together to be having shortness of breath and advised to come to the emergency department. On arrival patient was hemodynamically stable and afebrile. Patient had prolonged end expiratory phase and mild wheezing. Denies productive cough, vision change, nausea, vomiting, abdominal pain, chest pain, fevers, rashes, recent sick contacts, recent travel. Patient does report some mild headaches when she has coughing spells.  This is  likely COPD exacerbation but we'll obtain chest x-ray to rule out pneumonia and will obtain EKG to ensure no significant cardiac etiology. Additional obtain troponin and electrolytes. EKG relatively unchanged from previous EKG and no obvious ischemia at this time. Initial troponin was not elevated and patient denies chest pain. Chest x-ray were consistent with COPD without obvious pneumonia. Patient giving a breathing treatment with significant improvement in symptoms. Patient had increased wheezing after breathing treatment. The patient edema additionally given oral steroids. In setting these findings this is likely COPD exacerbation. Patient given inhaler in the emergency department to use. Additionally we'll discharge home on a short course of steroids. Patient advised to follow-up with PCP in one to 2 days for reevaluation. Patient given strict return precautions and stable at time of discharge. Patient and family in agreement with plan.  Attending is seen and evaluated patient and Dr. Roxanne Mins is in agreement with plan.    Esaw Grandchild, MD [phone]  Delora Fuel, MD [phone]

## 2015-06-20 NOTE — ED Notes (Signed)
Pt reports to the ED for eval of dry cough, SOB with minimal exertion, and HA. Pt reports the HA is made worse by coughing. Pt denies any fevers or chills. Pt also denies any CP, extremity swelling, N/V/D or nasal congestion/drainage. Pt A&Ox4, resp e/u, and skin warm and dry.

## 2015-06-20 NOTE — Discharge Instructions (Signed)
Chronic Obstructive Pulmonary Disease Exacerbation Chronic obstructive pulmonary disease (COPD) is a common lung problem. In COPD, the flow of air from the lungs is limited. COPD exacerbations are times that breathing gets worse and you need extra treatment. Without treatment they can be life threatening. If they happen often, your lungs can become more damaged. If your COPD gets worse, your doctor may treat you with:  Medicines.  Oxygen.  Different ways to clear your airway, such as using a mask. HOME CARE  Do not smoke.  Avoid tobacco smoke and other things that bother your lungs.  If given, take your antibiotic medicine as told. Finish the medicine even if you start to feel better.  Only take medicines as told by your doctor.  Drink enough fluids to keep your pee (urine) clear or pale yellow (unless your doctor has told you not to).  Use a cool mist machine (vaporizer).  If you use oxygen or a machine that turns liquid medicine into a mist (nebulizer), continue to use them as told.  Keep up with shots (vaccinations) as told by your doctor.  Exercise regularly.  Eat healthy foods.  Keep all doctor visits as told. GET HELP RIGHT AWAY IF:  You are very short of breath and it gets worse.  You have trouble talking.  You have bad chest pain.  You have blood in your spit (sputum).  You have a fever.  You keep throwing up (vomiting).  You feel weak, or you pass out (faint).  You feel confused.  You keep getting worse. MAKE SURE YOU:  Understand these instructions.  Will watch your condition.  Will get help right away if you are not doing well or get worse.   This information is not intended to replace advice given to you by your health care provider. Make sure you discuss any questions you have with your health care provider.   Document Released: 06/01/2011 Document Revised: 07/03/2014 Document Reviewed: 02/14/2013 Elsevier Interactive Patient Education 2016  Elsevier Inc[employer]  Cough, Adult A cough helps to clear your throat and lungs. A cough may last only 2-3 weeks (acute), or it may last longer than 8 weeks (chronic). Many different things can cause a cough. A cough may be a sign of an illness or another medical condition. HOME CARE  Pay attention to any changes in your cough.  Take medicines only as told by your doctor.  If you were prescribed an antibiotic medicine, take it as told by your doctor. Do not stop taking it even if you start to feel better.  Talk with your doctor before you try using a cough medicine.  Drink enough fluid to keep your pee (urine) clear or pale yellow.  If the air is dry, use a cold steam vaporizer or humidifier in your home.  Stay away from things that make you cough at work or at home.  If your cough is worse at night, try using extra pillows to raise your head up higher while you sleep.  Do not smoke, and try not to be around smoke. If you need help quitting, ask your doctor.  Do not have caffeine.  Do not drink alcohol.  Rest as needed. GET HELP IF:  You have new problems (symptoms).  You cough up yellow fluid (pus).  Your cough does not get better after 2-3 weeks, or your cough gets worse.  Medicine does not help your cough and you are not sleeping well.  You have pain that gets worse  or pain that is not helped with medicine.  You have a fever.  You are losing weight and you do not know why.  You have night sweats. GET HELP RIGHT AWAY IF:  You cough up blood.  You have trouble breathing.  Your heartbeat is very fast.   This information is not intended to replace advice given to you by your health care provider. Make sure you discuss any questions you have with your health care provider.   Document Released: 02/23/2011 Document Revised: 03/03/2015 Document Reviewed: 08/19/2014 Elsevier Interactive Patient Education 2016 [employer].  How to Use an Inhaler Using your inhaler  correctly is very important. Good technique will make sure that the medicine reaches your lungs.  HOW TO USE AN INHALER:  Take the cap off the inhaler.  If this is the first time using your inhaler, you need to prime it. Shake the inhaler for 5 seconds. Release four puffs into the air, away from your face. Ask your doctor for help if you have questions.  Shake the inhaler for 5 seconds.  Turn the inhaler so the bottle is above the mouthpiece.  Put your pointer finger on top of the bottle. Your thumb holds the bottom of the inhaler.  Open your mouth.  Either hold the inhaler away from your mouth (the width of 2 fingers) or place your lips tightly around the mouthpiece. Ask your doctor which way to use your inhaler.  Breathe out as much air as possible.  Breathe in and push down on the bottle 1 time to release the medicine. You will feel the medicine go in your mouth and throat.  Continue to take a deep breath in very slowly. Try to fill your lungs.  After you have breathed in completely, hold your breath for 10 seconds. This will help the medicine to settle in your lungs. If you cannot hold your breath for 10 seconds, hold it for as long as you can before you breathe out.  Breathe out slowly, through pursed lips. Whistling is an example of pursed lips.  If your doctor has told you to take more than 1 puff, wait at least 15-30 seconds between puffs. This will help you get the best results from your medicine. Do not use the inhaler more than your doctor tells you to.  Put the cap back on the inhaler.  Follow the directions from your doctor or from the inhaler package about cleaning the inhaler. If you use more than one inhaler, ask your doctor which inhalers to use and what order to use them in. Ask your doctor to help you figure out when you will need to refill your inhaler.  If you use a steroid inhaler, always rinse your mouth with water after your last puff, gargle and spit out the  water. Do not swallow the water. GET HELP IF:  The inhaler medicine only partially helps to stop wheezing or shortness of breath.  You are having trouble using your inhaler.  You have some increase in thick spit (phlegm). GET HELP RIGHT AWAY IF:  The inhaler medicine does not help your wheezing or shortness of breath or you have tightness in your chest.  You have dizziness, headaches, or fast heart rate.  You have chills, fever, or night sweats.  You have a large increase of thick spit, or your thick spit is bloody. MAKE SURE YOU:   Understand these instructions.  Will watch your condition.  Will get help right away if you  are not doing well or get worse.   This information is not intended to replace advice given to you by your health care provider. Make sure you discuss any questions you have with your health care provider.   Document Released: 03/21/2008 Document Revised: 04/02/2013 Document Reviewed: 01/09/2013 Elsevier Interactive Patient Education 2016 [place] Shortness of breath means you have trouble breathing. Shortness of breath needs medical care right away. HOME CARE   Do not smoke.  Avoid being around chemicals or things (paint fumes, dust) that may bother your breathing.  Rest as needed. Slowly begin your normal activities.  Only take medicines as told by your doctor.  Keep all doctor visits as told. GET HELP RIGHT AWAY IF:   Your shortness of breath gets worse.  You feel lightheaded, pass out (faint), or have a cough that is not helped by medicine.  You cough up blood.  You have pain with breathing.  You have pain in your chest, arms, shoulders, or belly (abdomen).  You have a fever.  You cannot walk up stairs or exercise the way you normally do.  You do not get better in the time expected.  You have a hard time doing normal activities even with rest.  You have problems with your medicines.  You have any new  symptoms. MAKE SURE YOU:  Understand these instructions.  Will watch your condition.  Will get help right away if you are not doing well or get worse.   This information is not intended to replace advice given to you by your health care provider. Make sure you discuss any questions you have with your health care provider.   Document Released: 11/29/2007 Document Revised: 06/17/2013 Document Reviewed: 08/28/2011 Elsevier Interactive Patient Education [employer].

## 2015-06-24 ENCOUNTER — Telehealth: Payer: Self-pay | Admitting: Pulmonary Disease

## 2015-06-24 NOTE — Telephone Encounter (Signed)
ONO with RA 06/15/15 >> test time 8 hrs 49 min.  Baseline SpO2 90.7%, low SpO2 86%.  Spent 7 min 40 sec with SpO2 < 88%.  Will have my nurse inform pt that ONO did not show significant enough oxygen desaturation to arrange for home oxygen set up.  Will discuss in more detail at next ROV.

## 2015-06-24 NOTE — Telephone Encounter (Signed)
Results have been explained to patient, pt expressed understanding. Nothing further needed.  

## 2015-07-21 ENCOUNTER — Ambulatory Visit (INDEPENDENT_AMBULATORY_CARE_PROVIDER_SITE_OTHER): Payer: Medicare Other | Admitting: Pulmonary Disease

## 2015-07-21 ENCOUNTER — Encounter: Payer: Self-pay | Admitting: Adult Health

## 2015-07-21 ENCOUNTER — Ambulatory Visit (INDEPENDENT_AMBULATORY_CARE_PROVIDER_SITE_OTHER): Payer: Medicare Other | Admitting: Adult Health

## 2015-07-21 VITALS — BP 106/70 | HR 92 | Temp 97.6°F | Ht 60.0 in | Wt 95.0 lb

## 2015-07-21 DIAGNOSIS — Z23 Encounter for immunization: Secondary | ICD-10-CM | POA: Diagnosis not present

## 2015-07-21 DIAGNOSIS — J441 Chronic obstructive pulmonary disease with (acute) exacerbation: Secondary | ICD-10-CM

## 2015-07-21 DIAGNOSIS — R0600 Dyspnea, unspecified: Secondary | ICD-10-CM

## 2015-07-21 DIAGNOSIS — G4734 Idiopathic sleep related nonobstructive alveolar hypoventilation: Secondary | ICD-10-CM | POA: Diagnosis not present

## 2015-07-21 LAB — PULMONARY FUNCTION TEST
DL/VA % PRED: 38 %
DL/VA: 1.64 ml/min/mmHg/L
DLCO unc % pred: 24 %
DLCO unc: 4.71 ml/min/mmHg
FEF 25-75 POST: 0.3 L/s
FEF 25-75 Pre: 0.46 L/sec
FEF2575-%Change-Post: -34 %
FEF2575-%PRED-POST: 24 %
FEF2575-%PRED-PRE: 38 %
FEV1-%CHANGE-POST: -15 %
FEV1-%Pred-Post: 50 %
FEV1-%Pred-Pre: 60 %
FEV1-PRE: 0.97 L
FEV1-Post: 0.82 L
FEV1FVC-%CHANGE-POST: -1 %
FEV1FVC-%PRED-PRE: 74 %
FEV6-%Change-Post: -16 %
FEV6-%Pred-Post: 71 %
FEV6-%Pred-Pre: 85 %
FEV6-Post: 1.47 L
FEV6-Pre: 1.76 L
FEV6FVC-%Change-Post: 0 %
FEV6FVC-%PRED-POST: 106 %
FEV6FVC-%Pred-Pre: 106 %
FVC-%Change-Post: -14 %
FVC-%PRED-POST: 68 %
FVC-%PRED-PRE: 80 %
FVC-POST: 1.5 L
FVC-PRE: 1.76 L
POST FEV1/FVC RATIO: 54 %
PRE FEV1/FVC RATIO: 55 %
Post FEV6/FVC ratio: 100 %
Pre FEV6/FVC Ratio: 100 %
RV % pred: 102 %
RV: 2.27 L
TLC % PRED: 90 %
TLC: 4.08 L

## 2015-07-21 NOTE — Assessment & Plan Note (Signed)
Recent flare in Dec , now resolved  PFT showed moderate disease , discussed smoking cessation is key and suggested adding  symbicort-she would like to hold off on this for now.   Plan  Continue on Spiriva daily .  Work on not smoking.  Prevnar vaccine today .  Follow up Dr. Halford Chessman  In 4 months and As needed

## 2015-07-21 NOTE — Progress Notes (Signed)
PFT done today. 

## 2015-07-21 NOTE — Progress Notes (Signed)
Subjective:    Patient ID: Ruth [name], female    DOB: Dec 16, 1932, 82 y.o.   MRN: [id number]  HPI 82 yo female smoker seen for pulmonary consult for nocturnal hypoxemia 06/10/15  TEST  ONO 06/15/15 baseline spO2 90%, spent 7 min >88%, not considered sign enough for O2  PFT 07/21/2015 >FEV1 60%, ratio 55, FVC 80, DLCO 24%, no sign BD response.    07/21/2015 Follow up : COPD, nocturnal hypoxia.  Pt returns for 1 month follow up .  Pt was seen last ov for pulmonary consult with Dr. Halford Chessman  For nocturnal hypoxia on sleep study done by neurology . Sleep study was boderline with AHI ~5.  She was set up for an ONO on 06/15/15 that did not show sign desats.  Pt is currently smoking, cessation discussed she is not interested in quitting .  PFT today shows moderate COPD with FEV1 at 60, ratio 55, FVC 80%, DLCO 24%.  We revewied these results .  She does have frequent COPD flares , discussed adding another inahler . She declines at this time.  She denies chest pain, orthopnea, edema .  Says she breathing is doing okay at present. Gets winded with acticity on /off.  No hemoptysis .  Flu shot utd Discussed Prevnar vaccine, she would like to get this today.  CXR 06/20/15 showed COPD changes .     Past Medical History  Diagnosis Date  . Osteoporosis, unspecified 04/01/2013  . Hyperlipidemia 04/01/2013  . Hearing loss 04/01/2013  . Cancer ([place])     cervical / ovarian   . AAA (abdominal aortic aneurysm) ([place])   . COPD (chronic obstructive pulmonary disease) ([place])   . Stroke ([place])   . Syncope and collapse   . Breast cancer ([place])   . Ovarian cancer ([place])   . Skin cancer    Current Outpatient Prescriptions on File Prior to Visit  Medication Sig Dispense Refill  . ALENDRONATE SODIUM PO Take 1 tablet by mouth every 7 (seven) days. Take on Mondays    . calcium carbonate (TUMS EX) 750 MG chewable tablet Chew 2 tablets by mouth daily.    . cholecalciferol (VITAMIN D) 1000 UNITS tablet Take 1  tablet (1,000 Units total) by mouth daily. 90 tablet 4  . clopidogrel (PLAVIX) 75 MG tablet Take 1 tablet (75 mg total) by mouth daily. 30 tablet 11  . fish oil-omega-3 fatty acids 1000 MG capsule Take 1 g by mouth daily.     Marland Kitchen ibuprofen (ADVIL,MOTRIN) 200 MG tablet Take 200 mg by mouth every 6 (six) hours as needed for moderate pain.    . pravastatin (PRAVACHOL) 10 MG tablet Take 10 mg by mouth daily.    . tamoxifen (NOLVADEX) 20 MG tablet Take 1 tablet (20 mg total) by mouth daily. 90 tablet 3  . tiotropium (SPIRIVA) 18 MCG inhalation capsule Place 18 mcg into inhaler and inhale daily.     No current facility-administered medications on file prior to visit.     Review of Systems Constitutional:   No  weight loss, night sweats,  Fevers, chills, fatigue, or  lassitude.  HEENT:   No headaches,  Difficulty swallowing,  Tooth/dental problems, or  Sore throat,                No sneezing, itching, ear ache, nasal congestion, post nasal drip,   CV:  No chest pain,  Orthopnea, PND, swelling in lower extremities, anasarca, dizziness, palpitations, syncope.   GI  No  heartburn, indigestion, abdominal pain, nausea, vomiting, diarrhea, change in bowel habits, loss of appetite, bloody stools.   Resp:    ,  No coughing up of blood.  No change in color of mucus.  No wheezing.  No chest wall deformity  Skin: no rash or lesions.  GU: no dysuria, change in color of urine, no urgency or frequency.  No flank pain, no hematuria   MS:  No joint pain or swelling.  No decreased range of motion.  No back pain.  Psych:  No change in mood or affect. No depression or anxiety.  No memory loss.         Objective:   Physical Exam Filed Vitals:   07/21/15 1206  BP: 106/70  Pulse: 92  Temp: 97.6 F (36.4 C)  TempSrc: Oral  Height: 5' (1.524 m)  Weight: 95 lb (43.092 kg)  SpO2: 95%   GEN: A/Ox3; pleasant , NAD, thin and elderly    HEENT:  [place]/AT,  EACs-clear, TMs-wnl, NOSE-clear, THROAT-clear, no  lesions, no postnasal drip or exudate noted.   NECK:  Supple w/ fair ROM; no JVD; normal carotid impulses w/o bruits; no thyromegaly or nodules palpated; no lymphadenopathy.  RESP  Decreased BS in bases , no accessory muscle use, no dullness to percussion  CARD:  RRR, no m/r/g  , no peripheral edema, pulses intact, no cyanosis or clubbing.  GI:   Soft & nt; nml bowel sounds; no organomegaly or masses detected.  Musco: Warm bil, no deformities or joint swelling noted.   Neuro: alert, no focal deficits noted.    Skin: Warm, no lesions or rashes         Assessment & Plan:

## 2015-07-21 NOTE — Progress Notes (Signed)
Reviewed and agree with assessment/plan. 

## 2015-07-21 NOTE — Patient Instructions (Addendum)
Continue on Spiriva daily .  Work on not smoking.  Prevnar vaccine today .  Follow up Dr. Halford Chessman  In 4 months and As needed

## 2015-07-21 NOTE — Assessment & Plan Note (Signed)
Minimal desats on ONO  No need for O2 at this time  Cont to follow

## 2015-07-22 ENCOUNTER — Telehealth: Payer: Self-pay | Admitting: Adult Health

## 2015-07-22 NOTE — Telephone Encounter (Signed)
[place] back to speak with Judeen Hammans - Closed as of 5pm Will have to call back in AM.

## 2015-07-23 NOTE — Telephone Encounter (Signed)
Spoke with Judeen Hammans at Dr. Deboraha Sprang office.  She said that [name] reviewed our OV notes and noticed that our office gave patient a Prevnar 13 vaccine on 07/21/15, he wanted to make [place] aware that patient had already received a Prevnar 13 vaccine from his office last year.  Also, he had questions about:   Nocturnal hypoxemia - Melvenia Needles, NP at 07/21/2015 12:46 PM     Status: Written Related Problem: Nocturnal hypoxemia   Expand All Collapse All   Minimal desats on ONO  No need for O2 at this time  Cont to follow        He did not know what "Minimal desats on ONO" meant.  I explained to [employer] that Minimal desats on ONO means that patient had minimal desaturations on Overnight Oximetry test.  She requested that I fax a copy of the ONO results to [name].   Copy of results has been faxed to [name] per request.  Juluis Rainier to TP

## 2015-07-23 NOTE — Telephone Encounter (Signed)
Thank you for the update, we asked the patient if they had had Prevnar and they said no.  Our records did not show any prevnar vaccine .  ONO is overnight oximetry and Dr. Irving Copas this in Dec and explained to patient there was not enough drop in oxygen to begin Oxygen.  O2 is oxygen.

## 2015-07-30 ENCOUNTER — Encounter: Payer: Self-pay | Admitting: Pulmonary Disease

## 2015-10-05 ENCOUNTER — Ambulatory Visit: Payer: Medicare Other | Admitting: Vascular Surgery

## 2015-10-05 ENCOUNTER — Other Ambulatory Visit (HOSPITAL_COMMUNITY): Payer: Medicare Other

## 2015-10-28 ENCOUNTER — Encounter: Payer: Self-pay | Admitting: Vascular Surgery

## 2015-11-02 ENCOUNTER — Ambulatory Visit (HOSPITAL_COMMUNITY)
Admission: RE | Admit: 2015-11-02 | Discharge: 2015-11-02 | Disposition: A | Discharge status: Home / Self Care | Payer: Medicare Other | Source: Ambulatory Visit | Attending: Vascular Surgery | Admitting: Vascular Surgery

## 2015-11-02 ENCOUNTER — Encounter: Payer: Self-pay | Admitting: Vascular Surgery

## 2015-11-02 ENCOUNTER — Ambulatory Visit (INDEPENDENT_AMBULATORY_CARE_PROVIDER_SITE_OTHER): Payer: Medicare Other | Admitting: Vascular Surgery

## 2015-11-02 VITALS — BP 164/95 | HR 78 | Temp 97.5°F | Resp 18 | Ht 60.0 in | Wt 93.1 lb

## 2015-11-02 DIAGNOSIS — E785 Hyperlipidemia, unspecified: Secondary | ICD-10-CM | POA: Insufficient documentation

## 2015-11-02 DIAGNOSIS — I714 Abdominal aortic aneurysm, without rupture, unspecified: Secondary | ICD-10-CM

## 2015-11-02 NOTE — Progress Notes (Signed)
Filed Vitals:   11/02/15 1212 11/02/15 1216  BP: 160/96 164/95  Pulse: 78   Temp: 97.5 F (36.4 C)   TempSrc: Oral   Resp: 18   Height: 5' (1.524 m)   Weight: 93 lb 1.6 oz (42.23 kg)   SpO2: 96%

## 2015-11-02 NOTE — Progress Notes (Signed)
[facility] [place]  Patient name: Ruth [name] MRN: [id number] DOB: 10-27-1932 Sex: female  REASON FOR VISIT: Follow-up of known abdominal aortic aneurysm  HPI: Ruth [name] is a 82 y.o. female seen today for ongoing follow-up of her known infrarenal abdominal aortic aneurysm. She has had no new major medical difficulties since my last visit with her one year ago. She is here today with her husband. He underwent open aneurysm repair years ago. She's had no cardiac issues.  Past Medical History  Diagnosis Date  . Osteoporosis, unspecified 04/01/2013  . Hyperlipidemia 04/01/2013  . Hearing loss 04/01/2013  . Cancer ([place])     cervical / ovarian   . AAA (abdominal aortic aneurysm) ([place])   . COPD (chronic obstructive pulmonary disease) ([place])   . Stroke ([place])   . Syncope and collapse   . Breast cancer ([place])   . Ovarian cancer ([place])   . Skin cancer     Family History  Problem Relation Age of Onset  . Hypertension Mother   . Hypertension Father   . Diabetes Father   . Heart disease Father   . Deep vein thrombosis Sister   . Diabetes Daughter   . Hyperlipidemia Daughter   . Diabetes Son   . Hyperlipidemia Son   . Seizures Neg Hx     SOCIAL HISTORY: Social History  Substance Use Topics  . Smoking status: Current Every Day Smoker -- 1.00 packs/day for 61 years    Types: Cigarettes    Start date: 06/27/1951  . Smokeless tobacco: Never Used  . Alcohol Use: 0.0 oz/week    0 Standard drinks or equivalent per week     Comment: rarely    No Known Allergies  Current Outpatient Prescriptions  Medication Sig Dispense Refill  . ALENDRONATE SODIUM PO Take 1 tablet by mouth every 7 (seven) days. Take on Mondays    . calcium carbonate (TUMS EX) 750 MG chewable tablet Chew 2 tablets by mouth daily.    . cholecalciferol (VITAMIN D) 1000 UNITS tablet Take 1 tablet (1,000 Units total) by mouth daily. 90 tablet 4  . clopidogrel (PLAVIX) 75 MG tablet Take 1 tablet  (75 mg total) by mouth daily. 30 tablet 11  . fish oil-omega-3 fatty acids 1000 MG capsule Take 1 g by mouth daily.     Marland Kitchen ibuprofen (ADVIL,MOTRIN) 200 MG tablet Take 200 mg by mouth every 6 (six) hours as needed for moderate pain.    . pravastatin (PRAVACHOL) 10 MG tablet Take 10 mg by mouth daily.    . tamoxifen (NOLVADEX) 20 MG tablet Take 1 tablet (20 mg total) by mouth daily. 90 tablet 3  . tiotropium (SPIRIVA) 18 MCG inhalation capsule Place 18 mcg into inhaler and inhale daily.     No current facility-administered medications for this visit.    REVIEW OF SYSTEMS:  [X]  denotes positive finding, [ ]  denotes negative finding Cardiac  Comments:  Chest pain or chest pressure:    Shortness of breath upon exertion: x   Short of breath when lying flat:    Irregular heart rhythm:        Vascular    Pain in calf, thigh, or hip brought on by ambulation:    Pain in feet at night that wakes you up from your sleep:     Blood clot in your veins:    Leg swelling:         Pulmonary    Oxygen at  home:    Productive cough:     Wheezing:         Neurologic    Sudden weakness in arms or legs:     Sudden numbness in arms or legs:     Sudden onset of difficulty speaking or slurred speech:    Temporary loss of vision in one eye:     Problems with dizziness:         Gastrointestinal    Blood in stool:     Vomited blood:         Genitourinary    Burning when urinating:     Blood in urine:        Psychiatric    Major depression:         Hematologic    Bleeding problems:    Problems with blood clotting too easily:        Skin    Rashes or ulcers:        Constitutional    Fever or chills:      PHYSICAL EXAM: Filed Vitals:   11/02/15 1212 11/02/15 1216  BP: 160/96 164/95  Pulse: 78   Temp: 97.5 F (36.4 C)   TempSrc: Oral   Resp: 18   Height: 5' (1.524 m)   Weight: 93 lb 1.6 oz (42.23 kg)   SpO2: 96%     GENERAL: The patient is a well-nourished female, in no acute  distress. The vital signs are documented above. CARDIAC: There is a regular rate and rhythm.  VASCULAR: 2+ radial, 2+ femoral and 2+ dorsalis pedis pulses bilaterally PULMONARY: There is good air exchange bilaterally without wheezing or rales. ABDOMEN: Soft and non-tender with normal pitched bowel sounds. Easily palpable aneurysm which is nontender MUSCULOSKELETAL: There are no major deformities or cyanosis. NEUROLOGIC: No focal weakness or paresthesias are detected. SKIN: There are no ulcers or rashes noted. PSYCHIATRIC: The patient has a normal affect.  DATA:  Ultrasound today shows definite increase to 5.2 cm of her infrarenal aneurysm. This compares to 4.3 cm by ultrasound one year ago.  MEDICAL ISSUES: I did review her CT scan for other causes from 2015. At that time she did appear to have an adequate infrarenal aortic neck for stent graft repair. She has a normal iliac vessels for device delivery. Had long discussion with the patient and her husband present. She has had relatively rapid expansion of her aneurysm and I'm concerned regarding continued observation only. I have recommended that we repeat her CT scan for stent graft sizing. Also will obtain cardiac clearance with the probable need for aneurysm repair. We will see her back following CT scan for further discussion.    Curt Jews [facility] [place]: [phone]

## 2015-11-03 ENCOUNTER — Other Ambulatory Visit: Payer: Self-pay | Admitting: *Deleted

## 2015-11-03 DIAGNOSIS — I714 Abdominal aortic aneurysm, without rupture, unspecified: Secondary | ICD-10-CM

## 2015-11-04 ENCOUNTER — Telehealth: Payer: Self-pay | Admitting: Vascular Surgery

## 2015-11-04 NOTE — Telephone Encounter (Signed)
Spoke with pt - CTA 11/12/15 11:30 am GI 315 No solids 4 hrs prior. Follow up Dr. Donnetta Hutching 11/16/15 10:45 am. Pt verbalized understanding.

## 2015-11-04 NOTE — Telephone Encounter (Signed)
Called patient to give her cardiac clearance appt 11/10/15 9:45 am with Dr. Gwenlyn Found. She verbalized understanding.

## 2015-11-10 ENCOUNTER — Ambulatory Visit (INDEPENDENT_AMBULATORY_CARE_PROVIDER_SITE_OTHER): Payer: Medicare Other | Admitting: Cardiovascular Disease

## 2015-11-10 ENCOUNTER — Encounter: Payer: Self-pay | Admitting: Cardiovascular Disease

## 2015-11-10 VITALS — BP 136/80 | HR 90 | Ht 61.0 in | Wt 94.0 lb

## 2015-11-10 DIAGNOSIS — I714 Abdominal aortic aneurysm, without rupture, unspecified: Secondary | ICD-10-CM

## 2015-11-10 DIAGNOSIS — R0602 Shortness of breath: Secondary | ICD-10-CM | POA: Diagnosis not present

## 2015-11-10 DIAGNOSIS — Z01818 Encounter for other preprocedural examination: Secondary | ICD-10-CM | POA: Diagnosis not present

## 2015-11-10 DIAGNOSIS — R55 Syncope and collapse: Secondary | ICD-10-CM

## 2015-11-10 NOTE — Assessment & Plan Note (Signed)
History of abdominal aortic aneurysm now measuring approximately 5.2  cm by duplex ultrasound 11/02/15. The patient is scheduled to have an endoluminal stent graft in the upcoming future by Dr. Donnetta Hutching . She is referred here for preoperative clearance. I'm going to obtain a 2-D echocardiogram for LV function and a pharmacologic Myoview stress test to risk stratify her.

## 2015-11-10 NOTE — Assessment & Plan Note (Signed)
History of hyperlipidemia on statin therapy followed by her PCP. 

## 2015-11-10 NOTE — Progress Notes (Signed)
11/10/2015 Ruth [name]   04-16-1933  [id number]  Primary Physician Marylene Land, MD Primary Cardiologist: Lorretta Harp MD Renae Gloss   HPI:  Ruth [name]  is a very pleasant 82 year old thin, frail and chronically ill-appearing married Caucasian female mother of 2, grandmother of one grandchild who is accompanied by her husband [name] today. She was referred by Dr. Donnetta Hutching for cardiovascular clearance prior to endoluminal stent grafting for a 5.2 cm abdominal aortic aneurysm. Risk factors include 65 pack years of tobacco abuse continuing to smoke one pack per day, treated hyperlipidemia and family history. She has never had a heart attack but has had a TIA in the past and is on Plavix presumably for that. She is chronically short of breath from most likely COPD but denies chest pain.   Current Outpatient Prescriptions  Medication Sig Dispense Refill  . ALENDRONATE SODIUM PO Take 1 tablet by mouth every 7 (seven) days. Take on Mondays    . calcium carbonate (TUMS EX) 750 MG chewable tablet Chew 1 tablet by mouth daily.     . cholecalciferol (VITAMIN D) 1000 UNITS tablet Take 1 tablet (1,000 Units total) by mouth daily. 90 tablet 4  . clopidogrel (PLAVIX) 75 MG tablet Take 1 tablet (75 mg total) by mouth daily. 30 tablet 11  . fish oil-omega-3 fatty acids 1000 MG capsule Take 1 g by mouth daily.     Marland Kitchen ibuprofen (ADVIL,MOTRIN) 200 MG tablet Take 200 mg by mouth every 6 (six) hours as needed for moderate pain.    . tamoxifen (NOLVADEX) 20 MG tablet Take 1 tablet (20 mg total) by mouth daily. 90 tablet 3  . tiotropium (SPIRIVA) 18 MCG inhalation capsule Place 18 mcg into inhaler and inhale daily.    . pravastatin (PRAVACHOL) 20 MG tablet Take 1 tablet by mouth daily.  0   No current facility-administered medications for this visit.    No Known Allergies  Social History   Social History  . Marital Status: Married    Spouse Name: N/A  . Number of Children: N/A  . Years of  Education: N/A   Occupational History  . retired    Social History Main Topics  . Smoking status: Current Every Day Smoker -- 1.00 packs/day for 61 years    Types: Cigarettes    Start date: 06/27/1951  . Smokeless tobacco: Never Used  . Alcohol Use: 0.0 oz/week    0 Standard drinks or equivalent per week     Comment: rarely  . Drug Use: No  . Sexual Activity: No   Other Topics Concern  . Not on file   Social History Narrative     Review of Systems: General: negative for chills, fever, night sweats or weight changes.  Cardiovascular: negative for chest pain, dyspnea on exertion, edema, orthopnea, palpitations, paroxysmal nocturnal dyspnea or shortness of breath Dermatological: negative for rash Respiratory: negative for cough or wheezing Urologic: negative for hematuria Abdominal: negative for nausea, vomiting, diarrhea, bright red blood per rectum, melena, or hematemesis Neurologic: negative for visual changes, syncope, or dizziness All other systems reviewed and are otherwise negative except as noted above.    Blood pressure 136/80, pulse 90, height 5\' 1"  (1.549 m), weight 94 lb (42.638 kg).  General appearance: alert and no distress Neck: no adenopathy, no carotid bruit, no JVD, supple, symmetrical, trachea midline and thyroid not enlarged, symmetric, no tenderness/mass/nodules Lungs: clear to auscultation bilaterally Heart: regular rate and rhythm, S1, S2 normal, no murmur,  click, rub or gallop Extremities: extremities normal, atraumatic, no cyanosis or edema  EKG normal sinus rhythm at 91 with right axis deviation. Procedure this EKG  ASSESSMENT AND PLAN:   AAA (abdominal aortic aneurysm) without rupture (HCC) History of abdominal aortic aneurysm now measuring approximately 5.2  cm by duplex ultrasound 11/02/15. The patient is scheduled to have an endoluminal stent graft in the upcoming future by Dr. Donnetta Hutching . She is referred here for preoperative clearance. I'm going to  obtain a 2-D echocardiogram for LV function and a pharmacologic Myoview stress test to risk stratify her.  Hyperlipidemia History of hyperlipidemia on statin therapy followed by her PCP      Lorretta Harp MD [facility], [facility] 11/10/2015 10:55 AM

## 2015-11-10 NOTE — Patient Instructions (Signed)
Medication Instructions:  Your physician recommends that you continue on your current medications as directed. Please refer to the Current Medication list given to you today.   Labwork: none  Testing/Procedures: Your physician has requested that you have a lexiscan myoview. For further information please visit [email or web address]. Please follow instruction sheet, as given.  Your physician has requested that you have an echocardiogram. Echocardiography is a painless test that uses sound waves to create images of your heart. It provides your doctor with information about the size and shape of your heart and how well your heart's chambers and valves are working. This procedure takes approximately one hour. There are no restrictions for this procedure.    Follow-Up: Your physician recommends that you schedule a follow-up appointment ON AN AS NEEDED BASIS.   Any Other Special Instructions Will Be Listed Below (If Applicable).     If you need a refill on your cardiac medications before your next appointment, please call your pharmacy.

## 2015-11-12 ENCOUNTER — Encounter: Payer: Self-pay | Admitting: Vascular Surgery

## 2015-11-12 ENCOUNTER — Ambulatory Visit
Admission: RE | Admit: 2015-11-12 | Discharge: 2015-11-12 | Disposition: A | Discharge status: Home / Self Care | Payer: Medicare Other | Source: Ambulatory Visit | Attending: Vascular Surgery | Admitting: Vascular Surgery

## 2015-11-12 DIAGNOSIS — I714 Abdominal aortic aneurysm, without rupture, unspecified: Secondary | ICD-10-CM

## 2015-11-12 MED ORDER — IOPAMIDOL (ISOVUE-370) INJECTION 76%
50.0000 mL | Freq: Once | INTRAVENOUS | Status: AC | PRN
  Administered 2015-11-12: 50 mL via INTRAVENOUS

## 2015-11-16 ENCOUNTER — Ambulatory Visit (INDEPENDENT_AMBULATORY_CARE_PROVIDER_SITE_OTHER): Payer: Medicare Other | Admitting: Vascular Surgery

## 2015-11-16 ENCOUNTER — Encounter: Payer: Self-pay | Admitting: Vascular Surgery

## 2015-11-16 VITALS — BP 154/98 | HR 91 | Temp 97.0°F | Resp 14 | Ht 60.5 in | Wt 92.0 lb

## 2015-11-16 DIAGNOSIS — I714 Abdominal aortic aneurysm, without rupture, unspecified: Secondary | ICD-10-CM

## 2015-11-16 NOTE — Progress Notes (Signed)
Filed Vitals:   11/16/15 1057 11/16/15 1103  BP: 154/96 154/98  Pulse: 91 91  Temp: 97 F (36.1 C)   Resp: 14   Height: 5' 0.5" (1.537 m)   Weight: 92 lb (41.731 kg)   SpO2: 96%

## 2015-11-16 NOTE — Progress Notes (Signed)
[facility] and [facility] Specialist of [place]  Patient name: Ruth [name] MRN: [id number] DOB: [birth year] Sex: female  REASON FOR VISIT: Continued follow-up of abdominal aortic aneurysm  HPI: Ruth [name] is a 82 y.o. female here today for discussion of her abdominal aortic aneurysm and results of her recent CT scan. She had a known infrarenal abdominal aortic aneurysm with expansion on ultrasound. She underwent a recent CT scan for further evaluation of this. She has no symptoms referable to her aneurysm.  Past Medical History  Diagnosis Date  . Osteoporosis, unspecified 04/01/2013  . Hyperlipidemia 04/01/2013  . Hearing loss 04/01/2013  . Cancer ([place])     cervical / ovarian   . AAA (abdominal aortic aneurysm) ([place])   . COPD (chronic obstructive pulmonary disease) ([place])   . Stroke ([place])   . Syncope and collapse   . Breast cancer ([place])   . Ovarian cancer ([place])   . Skin cancer     Family History  Problem Relation Age of Onset  . Hypertension Mother   . Hypertension Father   . Diabetes Father   . Heart disease Father   . Deep vein thrombosis Sister   . Diabetes Daughter   . Hyperlipidemia Daughter   . Diabetes Son   . Hyperlipidemia Son   . Seizures Neg Hx     SOCIAL HISTORY: Social History  Substance Use Topics  . Smoking status: Current Every Day Smoker -- 1.00 packs/day for 61 years    Types: Cigarettes    Start date: 06/27/1951  . Smokeless tobacco: Never Used  . Alcohol Use: 0.0 oz/week    0 Standard drinks or equivalent per week     Comment: rarely    No Known Allergies  Current Outpatient Prescriptions  Medication Sig Dispense Refill  . ALENDRONATE SODIUM PO Take 1 tablet by mouth every 7 (seven) days. Take on Mondays    . calcium carbonate (TUMS EX) 750 MG chewable tablet Chew 1 tablet by mouth daily.     . cholecalciferol (VITAMIN D) 1000 UNITS tablet Take 1 tablet (1,000 Units total) by mouth daily. 90 tablet 4  . clopidogrel (PLAVIX) 75 MG tablet Take 1  tablet (75 mg total) by mouth daily. 30 tablet 11  . fish oil-omega-3 fatty acids 1000 MG capsule Take 1 g by mouth daily.     Marland Kitchen ibuprofen (ADVIL,MOTRIN) 200 MG tablet Take 200 mg by mouth every 6 (six) hours as needed for moderate pain.    . pravastatin (PRAVACHOL) 20 MG tablet Take 1 tablet by mouth daily.  0  . tamoxifen (NOLVADEX) 20 MG tablet Take 1 tablet (20 mg total) by mouth daily. 90 tablet 3  . tiotropium (SPIRIVA) 18 MCG inhalation capsule Place 18 mcg into inhaler and inhale daily.     No current facility-administered medications for this visit.    REVIEW OF SYSTEMS:  [X]  denotes positive finding, [ ]  denotes negative finding Cardiac  Comments:  Chest pain or chest pressure:    Shortness of breath upon exertion:    Short of breath when lying flat:    Irregular heart rhythm:        Vascular    Pain in calf, thigh, or hip brought on by ambulation:    Pain in feet at night that wakes you up from your sleep:     Blood clot in your veins:    Leg swelling:         Pulmonary    Oxygen at  home:    Productive cough:     Wheezing:         Neurologic    Sudden weakness in arms or legs:     Sudden numbness in arms or legs:     Sudden onset of difficulty speaking or slurred speech:    Temporary loss of vision in one eye:     Problems with dizziness:         Gastrointestinal    Blood in stool:     Vomited blood:         Genitourinary    Burning when urinating:     Blood in urine:        Psychiatric    Major depression:         Hematologic    Bleeding problems:    Problems with blood clotting too easily:        Skin    Rashes or ulcers:        Constitutional    Fever or chills:      PHYSICAL EXAM: Filed Vitals:   11/16/15 1057 11/16/15 1103  BP: 154/96 154/98  Pulse: 91 91  Temp: 97 F (36.1 C)   Resp: 14   Height: 5' 0.5" (1.537 m)   Weight: 92 lb (41.731 kg)   SpO2: 96%     GENERAL: The patient is a well-nourished female, in no acute distress.  The vital signs are documented above.Palpable radial and femoral pulses VASCULAR: 2+ radial and 2+ femoral pulses PULMONARY: There is good air exchange bilaterally without wheezing or rales. ABDOMEN: Soft and non-tender with normal pitched bowel sounds. Easily palpable abdominal aortic aneurysm which is nontender MUSCULOSKELETAL: There are no major deformities or cyanosis. NEUROLOGIC: No focal weakness or paresthesias are detected. SKIN: There are no ulcers or rashes noted. PSYCHIATRIC: The patient has a normal affect.  DATA:  CT scan from 11/12/2015 was reviewed and discussed with the patient. I reviewed the actual films showed the actual films of the patient and her husband present. She does have a 5-1/2 cm infrarenal aneurysm which compares to 4-1/2 cm 2 years ago. She does not have iliac artery aneurysms.  MEDICAL ISSUES: Long discussion with patient and her husband. Quite concerned regarding 1 cm growth in 2 years. Percent her aneurysm size is 5.5 cm which is quite significant in this very small woman. She does have somewhat irregular infrarenal aorta prior to aneurysm. Her iliac arteries are somewhat small but so is her native aorta. I have discussed options of open aneurysm repair and stent graft repair. I feel that she would have a high risk due to her very small size and debilitated state advanced age. Would prefer stent graft repair possible. We'll review her films with stent graft clinical specialist. She is for cardiac clearance appointment in the next 1-2 weeks and will make recommendations following this.    Curt Jews [facility] [employer] [phone]

## 2015-11-19 ENCOUNTER — Telehealth (HOSPITAL_COMMUNITY): Payer: Self-pay

## 2015-11-19 NOTE — Addendum Note (Signed)
Addended by: Thresa Ross C on: 11/19/2015 12:05 PM   Modules accepted: Orders

## 2015-11-19 NOTE — Telephone Encounter (Signed)
Encounter complete. 

## 2015-11-23 ENCOUNTER — Ambulatory Visit (HOSPITAL_COMMUNITY)
Admission: RE | Admit: 2015-11-23 | Discharge: 2015-11-23 | Disposition: A | Discharge status: Home / Self Care | Payer: Medicare Other | Source: Ambulatory Visit | Attending: Cardiology | Admitting: Cardiology

## 2015-11-23 DIAGNOSIS — R5383 Other fatigue: Secondary | ICD-10-CM | POA: Insufficient documentation

## 2015-11-23 DIAGNOSIS — Z720 Tobacco use: Secondary | ICD-10-CM | POA: Diagnosis not present

## 2015-11-23 DIAGNOSIS — I714 Abdominal aortic aneurysm, without rupture, unspecified: Secondary | ICD-10-CM

## 2015-11-23 DIAGNOSIS — R0609 Other forms of dyspnea: Secondary | ICD-10-CM | POA: Diagnosis not present

## 2015-11-23 DIAGNOSIS — R55 Syncope and collapse: Secondary | ICD-10-CM | POA: Diagnosis not present

## 2015-11-23 DIAGNOSIS — Z01818 Encounter for other preprocedural examination: Secondary | ICD-10-CM | POA: Insufficient documentation

## 2015-11-23 DIAGNOSIS — Z8249 Family history of ischemic heart disease and other diseases of the circulatory system: Secondary | ICD-10-CM | POA: Insufficient documentation

## 2015-11-23 DIAGNOSIS — R0602 Shortness of breath: Secondary | ICD-10-CM | POA: Insufficient documentation

## 2015-11-23 DIAGNOSIS — I739 Peripheral vascular disease, unspecified: Secondary | ICD-10-CM | POA: Diagnosis not present

## 2015-11-23 LAB — MYOCARDIAL PERFUSION IMAGING
CHL CUP NUCLEAR SDS: 0
CHL CUP NUCLEAR SRS: 0
CHL CUP RESTING HR STRESS: 80 {beats}/min
LV dias vol: 34 mL (ref 46–106)
LV sys vol: 6 mL
NUC STRESS TID: 1.17
Peak HR: 102 {beats}/min
SSS: 0

## 2015-11-23 MED ORDER — REGADENOSON 0.4 MG/5ML IV SOLN
0.4000 mg | Freq: Once | INTRAVENOUS | Status: AC
  Administered 2015-11-23: 0.4 mg via INTRAVENOUS

## 2015-11-23 MED ORDER — TECHNETIUM TC 99M TETROFOSMIN IV KIT
10.9000 | PACK | Freq: Once | INTRAVENOUS | Status: AC | PRN
  Administered 2015-11-23: 10.9 via INTRAVENOUS
  Filled 2015-11-23: qty 11

## 2015-11-23 MED ORDER — TECHNETIUM TC 99M TETROFOSMIN IV KIT
31.8000 | PACK | Freq: Once | INTRAVENOUS | Status: AC | PRN
  Administered 2015-11-23: 31.8 via INTRAVENOUS
  Filled 2015-11-23: qty 32

## 2015-11-29 ENCOUNTER — Other Ambulatory Visit: Payer: Self-pay

## 2015-11-29 ENCOUNTER — Ambulatory Visit (HOSPITAL_COMMUNITY): Payer: Medicare Other | Attending: Cardiology

## 2015-11-29 DIAGNOSIS — I714 Abdominal aortic aneurysm, without rupture, unspecified: Secondary | ICD-10-CM

## 2015-11-29 DIAGNOSIS — R0602 Shortness of breath: Secondary | ICD-10-CM | POA: Diagnosis not present

## 2015-11-29 DIAGNOSIS — E785 Hyperlipidemia, unspecified: Secondary | ICD-10-CM | POA: Diagnosis not present

## 2015-11-29 DIAGNOSIS — Z01818 Encounter for other preprocedural examination: Secondary | ICD-10-CM | POA: Diagnosis not present

## 2015-11-29 DIAGNOSIS — C50919 Malignant neoplasm of unspecified site of unspecified female breast: Secondary | ICD-10-CM | POA: Insufficient documentation

## 2015-11-29 DIAGNOSIS — I272 Other secondary pulmonary hypertension: Secondary | ICD-10-CM | POA: Insufficient documentation

## 2015-11-29 DIAGNOSIS — I517 Cardiomegaly: Secondary | ICD-10-CM | POA: Insufficient documentation

## 2015-11-29 DIAGNOSIS — Z01810 Encounter for preprocedural cardiovascular examination: Secondary | ICD-10-CM | POA: Diagnosis present

## 2015-11-29 DIAGNOSIS — Z720 Tobacco use: Secondary | ICD-10-CM | POA: Insufficient documentation

## 2015-11-29 LAB — ECHOCARDIOGRAM COMPLETE
AV Area VTI: 2.06 cm2
AV Mean grad: 2 mmHg
AV VEL mean LVOT/AV: 0.79
AV area mean vel ind: 1.48 cm2/m2
AV peak Index: 1.53
AV vel: 1.93
AVAREAMEANV: 2 cm2
AVAREAVTIIND: 1.43 cm2/m2
AVPG: 5 mmHg
AVPKVEL: 112 cm/s
Ao pk vel: 0.81 m/s
Ao-asc: 31 cm
DOP CAL AO MEAN VELOCITY: 70.9 cm/s
E/e' ratio: 8.72
EWDT: 208 ms
FS: 30 % (ref 28–44)
IVS/LV PW RATIO, ED: 0.9
LA ID, A-P, ES: 26 cm
LA diam index: 1.93 cm/m2
LA vol A4C: 16.6 ml
LA vol: 20.3 cm3
LAVOLIN: 15.1 mL/m2
LEFT ATRIUM END SYS DIAM: 26 cm
LV E/e' medial: 8.72
LV E/e'average: 8.72
LV PW d: 9.42 mm — AB (ref 0.6–1.1)
LV TDI E'LATERAL: 7.51
LV TDI E'MEDIAL: 6.64
LVELAT: 7.51 cm/s
LVOT SV: 42 cm3
LVOT VTI: 16.5 cm
LVOT area: 2.54 cm2
LVOT peak vel: 90.9 cm/s
LVOTD: 18 mm
LVOTVTI: 0.76 cm
MV Dec: 208
MV pk A vel: 99 m/s
MVPKEVEL: 65.5 m/s
RV LATERAL S' VELOCITY: 9.57 cm/s
RV sys press: 55 mmHg
Reg peak vel: 344 cm/s
TAPSE: 18 cm
TR max vel: 344 m/s
VTI: 21.7 cm
Valve area index: 1.43
Valve area: 1.93 cm2

## 2015-12-06 ENCOUNTER — Telehealth: Payer: Self-pay | Admitting: Cardiovascular Disease

## 2015-12-06 NOTE — Telephone Encounter (Signed)
Spoke to patient and husband.  ECHO Result given . Verbalized understanding  Husband wanted to know if patient cleared for  AAA surgery with Dr EARLY  Informed husband will defer to DR BERRY- FOR CLEARANCE

## 2015-12-06 NOTE — Telephone Encounter (Signed)
Pt would like her echo results from last week please.

## 2015-12-06 NOTE — Telephone Encounter (Signed)
MV low risk. Nl LV fxn on 2D. Cleared for ELG by Dr Early at low risk.  JJB

## 2015-12-07 ENCOUNTER — Ambulatory Visit (INDEPENDENT_AMBULATORY_CARE_PROVIDER_SITE_OTHER): Payer: Medicare Other | Admitting: Vascular Surgery

## 2015-12-07 ENCOUNTER — Encounter: Payer: Self-pay | Admitting: Vascular Surgery

## 2015-12-07 VITALS — BP 138/88 | HR 86 | Temp 98.0°F | Resp 18 | Ht 60.0 in | Wt 92.8 lb

## 2015-12-07 DIAGNOSIS — I714 Abdominal aortic aneurysm, without rupture, unspecified: Secondary | ICD-10-CM

## 2015-12-07 NOTE — Telephone Encounter (Signed)
Message routed to [phone] to Dr Luther Parody office.

## 2015-12-07 NOTE — Telephone Encounter (Signed)
OK to interrupt plavix for ELG

## 2015-12-07 NOTE — Telephone Encounter (Signed)
Called Dr Luther Parody office to discuss clearance and see if the pt needed to be off her plavix prior to procedure. Left message for his nurse to call me back to discuss.

## 2015-12-07 NOTE — Progress Notes (Signed)
[facility] [place]  Patient name: Ruth [name] MRN: [id number] DOB: 1933-01-03 Sex: female  REASON FOR VISIT: Discuss aneurysm repair  HPI: Ruth [name] is a 82 y.o. female here today for ongoing discussion regarding infrarenal abdominal aortic aneurysm. He has undergone cardiac clearance with Dr. Gwenlyn [name]. She has had a 2-D echo and a Myoview which shows showed no evidence of reversible ischemia and normal LV function. I reviewed her films again with the the device manufacturer and does not appear that she is a candidate for stent graft in which is what we discussed. Her neck of her aorta is very complex and begins just below the renal artery takeoff.  Past Medical History  Diagnosis Date  . Osteoporosis, unspecified 04/01/2013  . Hyperlipidemia 04/01/2013  . Hearing loss 04/01/2013  . Cancer ([place])     cervical / ovarian   . AAA (abdominal aortic aneurysm) ([place])   . COPD (chronic obstructive pulmonary disease) ([place])   . Stroke ([place])   . Syncope and collapse   . Breast cancer ([place])   . Ovarian cancer ([place])   . Skin cancer     Family History  Problem Relation Age of Onset  . Hypertension Mother   . Hypertension Father   . Diabetes Father   . Heart disease Father   . Deep vein thrombosis Sister   . Diabetes Daughter   . Hyperlipidemia Daughter   . Diabetes Son   . Hyperlipidemia Son   . Seizures Neg Hx     SOCIAL HISTORY: Social History  Substance Use Topics  . Smoking status: Current Every Day Smoker -- 1.00 packs/day for 61 years    Types: Cigarettes    Start date: 06/27/1951  . Smokeless tobacco: Never Used  . Alcohol Use: 0.0 oz/week    0 Standard drinks or equivalent per week     Comment: rarely    No Known Allergies  Current Outpatient Prescriptions  Medication Sig Dispense Refill  . ALENDRONATE SODIUM PO Take 1 tablet by mouth every 7 (seven) days. Take on Mondays    . calcium carbonate (TUMS EX) 750  MG chewable tablet Chew 1 tablet by mouth daily.     . cholecalciferol (VITAMIN D) 1000 UNITS tablet Take 1 tablet (1,000 Units total) by mouth daily. 90 tablet 4  . clopidogrel (PLAVIX) 75 MG tablet Take 1 tablet (75 mg total) by mouth daily. 30 tablet 11  . fish oil-omega-3 fatty acids 1000 MG capsule Take 1 g by mouth daily.     Marland Kitchen ibuprofen (ADVIL,MOTRIN) 200 MG tablet Take 200 mg by mouth every 6 (six) hours as needed for moderate pain.    . pravastatin (PRAVACHOL) 20 MG tablet Take 1 tablet by mouth daily.  0  . tamoxifen (NOLVADEX) 20 MG tablet Take 1 tablet (20 mg total) by mouth daily. 90 tablet 3  . tiotropium (SPIRIVA) 18 MCG inhalation capsule Place 18 mcg into inhaler and inhale daily.     No current facility-administered medications for this visit.    REVIEW OF SYSTEMS:  [X]  denotes positive finding, [ ]  denotes negative finding Cardiac  Comments:  Chest pain or chest pressure:    Shortness of breath upon exertion:    Short of breath when lying flat:    Irregular heart rhythm:        Vascular    Pain in calf, thigh, or hip brought on by ambulation:    Pain in feet at night that  wakes you up from your sleep:     Blood clot in your veins:    Leg swelling:         Pulmonary    Oxygen at home:    Productive cough:     Wheezing:         Neurologic    Sudden weakness in arms or legs:     Sudden numbness in arms or legs:     Sudden onset of difficulty speaking or slurred speech:    Temporary loss of vision in one eye:     Problems with dizziness:         Gastrointestinal    Blood in stool:     Vomited blood:         Genitourinary    Burning when urinating:     Blood in urine:        Psychiatric    Major depression:         Hematologic    Bleeding problems:    Problems with blood clotting too easily:        Skin    Rashes or ulcers:        Constitutional    Fever or chills:      PHYSICAL EXAM: Filed Vitals:   12/07/15 1432 12/07/15 1436  BP: 151/93  138/88  Pulse: 86   Temp: 98 F (36.7 C)   TempSrc: Oral   Resp: 18   Height: 5' (1.524 m)   Weight: 92 lb 12.8 oz (42.094 kg)   SpO2: 96%     GENERAL: The patient is a well-nourished female, in no acute distress. The vital signs are documented above.  MUSCULOSKELETAL: There are no major deformities or cyanosis. NEUROLOGIC: No focal weakness or paresthesias are detected. SKIN: There are no ulcers or rashes noted. PSYCHIATRIC: The patient has a normal affect.  DATA:  General reviewed her CT scan also her Myoview and 2-D echo. She has had marked increase in the size of her aneurysm now up to 5.4. She is quite small.  MEDICAL ISSUES: Acceptable risk. She is quite thin. Also has a significant baseline pulmonary dysfunction. Would recommend open aneurysm repair. Did discuss fenestrated graft quite concerned regarding her anatomy and this. Recommend standard open aneurysm repair. Explains proximally 1-2 day ICU stay with probable 5-7 day hospitalization. They have a [place] vacation at the beach in late [place] and wished to schedule this for Ruth [name] July.    Ruth Posner, MD FACS [facility] Tel [phone] Pager [phone]

## 2015-12-07 NOTE — Telephone Encounter (Signed)
Received call from Dr Luther Parody office. Dr Early requests for pt to be off plavix for 5 days prior to surgery.  Will route to Dr Gwenlyn Found for advice.

## 2015-12-07 NOTE — Telephone Encounter (Signed)
Returned call to pt, Husband answered, ok per DPR. Let him know Dr Gwenlyn Found had given clearance and I would be contacting Dr Luther Parody office with the clearance.

## 2015-12-08 ENCOUNTER — Other Ambulatory Visit: Payer: Self-pay

## 2015-12-22 ENCOUNTER — Encounter (HOSPITAL_COMMUNITY): Payer: Self-pay

## 2015-12-22 ENCOUNTER — Encounter (HOSPITAL_COMMUNITY)
Admission: RE | Admit: 2015-12-22 | Discharge: 2015-12-22 | Disposition: A | Discharge status: Home / Self Care | Payer: Medicare Other | Source: Ambulatory Visit | Attending: Vascular Surgery | Admitting: Vascular Surgery

## 2015-12-22 DIAGNOSIS — Z01812 Encounter for preprocedural laboratory examination: Secondary | ICD-10-CM | POA: Insufficient documentation

## 2015-12-22 DIAGNOSIS — Z8673 Personal history of transient ischemic attack (TIA), and cerebral infarction without residual deficits: Secondary | ICD-10-CM | POA: Diagnosis not present

## 2015-12-22 DIAGNOSIS — E785 Hyperlipidemia, unspecified: Secondary | ICD-10-CM | POA: Diagnosis not present

## 2015-12-22 DIAGNOSIS — Z01818 Encounter for other preprocedural examination: Secondary | ICD-10-CM | POA: Insufficient documentation

## 2015-12-22 DIAGNOSIS — Z0183 Encounter for blood typing: Secondary | ICD-10-CM | POA: Insufficient documentation

## 2015-12-22 DIAGNOSIS — H9190 Unspecified hearing loss, unspecified ear: Secondary | ICD-10-CM | POA: Diagnosis not present

## 2015-12-22 DIAGNOSIS — J449 Chronic obstructive pulmonary disease, unspecified: Secondary | ICD-10-CM | POA: Diagnosis not present

## 2015-12-22 DIAGNOSIS — Z79899 Other long term (current) drug therapy: Secondary | ICD-10-CM | POA: Insufficient documentation

## 2015-12-22 DIAGNOSIS — Z79810 Long term (current) use of selective estrogen receptor modulators (SERMs): Secondary | ICD-10-CM | POA: Insufficient documentation

## 2015-12-22 DIAGNOSIS — I714 Abdominal aortic aneurysm, without rupture: Secondary | ICD-10-CM | POA: Diagnosis not present

## 2015-12-22 DIAGNOSIS — Z7983 Long term (current) use of bisphosphonates: Secondary | ICD-10-CM | POA: Diagnosis not present

## 2015-12-22 DIAGNOSIS — F17200 Nicotine dependence, unspecified, uncomplicated: Secondary | ICD-10-CM | POA: Diagnosis not present

## 2015-12-22 DIAGNOSIS — Z7902 Long term (current) use of antithrombotics/antiplatelets: Secondary | ICD-10-CM | POA: Diagnosis not present

## 2015-12-22 DIAGNOSIS — Z853 Personal history of malignant neoplasm of breast: Secondary | ICD-10-CM | POA: Diagnosis not present

## 2015-12-22 LAB — BLOOD GAS, ARTERIAL
ACID-BASE EXCESS: 3.1 mmol/L — AB (ref 0.0–2.0)
Bicarbonate: 27.2 mEq/L — ABNORMAL HIGH (ref 20.0–24.0)
DRAWN BY: 421801
FIO2: 0.21
O2 SAT: 96.9 %
PATIENT TEMPERATURE: 98.6
TCO2: 28.5 mmol/L (ref 0–100)
pCO2 arterial: 42.4 mmHg (ref 35.0–45.0)
pH, Arterial: 7.424 (ref 7.350–7.450)
pO2, Arterial: 86.1 mmHg (ref 80.0–100.0)

## 2015-12-22 LAB — TYPE AND SCREEN
ABO/RH(D): B POS
ANTIBODY SCREEN: NEGATIVE

## 2015-12-22 LAB — CBC
HCT: 43.6 % (ref 36.0–46.0)
Hemoglobin: 14.6 g/dL (ref 12.0–15.0)
MCH: 32.1 pg (ref 26.0–34.0)
MCHC: 33.5 g/dL (ref 30.0–36.0)
MCV: 95.8 fL (ref 78.0–100.0)
PLATELETS: 134 10*3/uL — AB (ref 150–400)
RBC: 4.55 MIL/uL (ref 3.87–5.11)
RDW: 13.5 % (ref 11.5–15.5)
WBC: 8.5 10*3/uL (ref 4.0–10.5)

## 2015-12-22 LAB — COMPREHENSIVE METABOLIC PANEL
ALBUMIN: 3.6 g/dL (ref 3.5–5.0)
ALT: 16 U/L (ref 14–54)
AST: 25 U/L (ref 15–41)
Alkaline Phosphatase: 41 U/L (ref 38–126)
Anion gap: 7 (ref 5–15)
BUN: 12 mg/dL (ref 6–20)
CHLORIDE: 107 mmol/L (ref 101–111)
CO2: 24 mmol/L (ref 22–32)
Calcium: 9 mg/dL (ref 8.9–10.3)
Creatinine, Ser: 1.12 mg/dL — ABNORMAL HIGH (ref 0.44–1.00)
GFR calc Af Amer: 52 mL/min — ABNORMAL LOW (ref >60)
GFR calc non Af Amer: 45 mL/min — ABNORMAL LOW (ref >60)
GLUCOSE: 139 mg/dL — AB (ref 65–99)
POTASSIUM: 4 mmol/L (ref 3.5–5.1)
Sodium: 138 mmol/L (ref 135–145)
TOTAL PROTEIN: 6.2 g/dL — AB (ref 6.5–8.1)
Total Bilirubin: 0.7 mg/dL (ref 0.3–1.2)

## 2015-12-22 LAB — PROTIME-INR
INR: 1.08 (ref 0.00–1.49)
PROTHROMBIN TIME: 14.2 s (ref 11.6–15.2)

## 2015-12-22 LAB — SURGICAL PCR SCREEN
MRSA, PCR: NEGATIVE
Staphylococcus aureus: NEGATIVE

## 2015-12-22 LAB — ABO/RH: ABO/RH(D): B POS

## 2015-12-22 LAB — APTT: APTT: 28 s (ref 24–37)

## 2015-12-22 NOTE — Pre-Procedure Instructions (Signed)
Ruth [name]  12/22/2015      [id number] - [place], [place] - [address] AT [facility] [address] [place] [address] Phone: [phone] Fax: [phone]    Your procedure is scheduled on July 7  Report to [place] at 530 A.M.  Call this number if you have problems the morning of surgery:  [phone]   Remember:  Do not eat food or drink liquids after midnight.  Take these medicines the morning of surgery with A SIP OF WATER albuterol inhaler if needed, tamoxifen (Nolvadex) Spiriva inhaler- bring  Your inhalers with you on the day of surgery. Stop Plavix as directed by your DR.  Stop taking aspirin, BC's, Goody's, Herbal medication, Fish Oil, Ibuprofen, Advil, Motrin, aleve, Vitamins   Do not wear jewelry, make-up or nail polish.  Do not wear lotions, powders, or perfumes.  You may wear deoderant.  Do not shave 48 hours prior to surgery.  Men may shave face and neck.  Do not bring valuables to the hospital.  [facility] is not responsible for any belongings or valuables.  Contacts, dentures or bridgework may not be worn into surgery.  Leave your suitcase in the car.  After surgery it may be brought to your room.  For patients admitted to the hospital, discharge time will be determined by your treatment team.  Patients discharged the day of surgery will not be allowed to drive home.    Special instructions:  [place] - Preparing for Surgery  Before surgery, you can play an important role.  Because skin is not sterile, your skin needs to be as free of germs as possible.  You can reduce the number of germs on you skin by washing with CHG (chlorahexidine gluconate) soap before surgery.  CHG is an antiseptic cleaner which kills germs and bonds with the skin to continue killing germs even after washing.  Please DO NOT use if you have an allergy to CHG or antibacterial soaps.  If your skin becomes  reddened/irritated stop using the CHG and inform your nurse when you arrive at Short Stay.  Do not shave (including legs and underarms) for at least 48 hours prior to the first CHG shower.  You may shave your face.  Please follow these instructions carefully:   1.  Shower with CHG Soap the night before surgery and the                                morning of Surgery.  2.  If you choose to wash your hair, wash your hair first as usual with your       normal shampoo.  3.  After you shampoo, rinse your hair and body thoroughly to remove the                      Shampoo.  4.  Use CHG as you would any other liquid soap.  You can apply chg directly       to the skin and wash gently with scrungie or a clean washcloth.  5.  Apply the CHG Soap to your body ONLY FROM THE NECK DOWN.        Do not use on open wounds or open sores.  Avoid contact with your eyes,       ears, mouth and genitals (  private parts).  Wash genitals (private parts)       with your normal soap.  6.  Wash thoroughly, paying special attention to the area where your surgery        will be performed.  7.  Thoroughly rinse your body with warm water from the neck down.  8.  DO NOT shower/wash with your normal soap after using and rinsing off       the CHG Soap.  9.  Pat yourself dry with a clean towel.            10.  Wear clean pajamas.            11.  Place clean sheets on your bed the night of your first shower and do not        sleep with pets.  Day of Surgery  Do not apply any lotions/deoderants the morning of surgery.  Please wear clean clothes to the hospital/surgery center.     Please read over the following fact sheets that you were given. Pain Booklet, Coughing and Deep Breathing, Blood Transfusion Information, MRSA Information and Surgical Site Infection Prevention

## 2015-12-22 NOTE — Progress Notes (Addendum)
PCP is Dr. Derinda Late Cardiologist is Dr. Gwenlyn Found- clearance noted in epic. Note from Dr Caryl Asp to stop Plavix 5 days prior to surgery.- Last dose 12-25-15 per patient Echo noted in epic from 11-29-15 Stress test noted in epic from 11-23-15 Instructed pt not to smoke on the day of surgery. Pt unable to obtain urine speci instructed pt to bring her urine with her on the day of surgery. Dr Luther Parody office called and message left with Stephine to inform of above.

## 2015-12-23 ENCOUNTER — Encounter (HOSPITAL_COMMUNITY): Payer: Self-pay | Admitting: Vascular Surgery

## 2015-12-23 NOTE — Progress Notes (Signed)
Anesthesia Chart Review: Patient is an 82 year old female scheduled for open AAA repair on 12/31/15 by Dr. Curt Jews. She is not a candidate for endovascular repair.  History includes smoking, HLD, AAA (5.4 X 5.0 cm), syncope with CVA '15, left breast cancer s/p lumpectomy '08, ovarian/cervical cancer s/p hysterectomy, COPD, hearing loss, cholecystectomy.   PCP is Dr. Derinda Late. Cardiologist is Dr. Gwenlyn Found (seen for pre-operative evaluation 11/10/15) who gave permission to hold Plavix 5 days prior to surgery. Stress and echo done (see below). Pulmonologist is Dr. Halford Chessman.            Meds include albuterol, Fosamax, Plavix, fish oil, tamoxifen, Spiriva.    11/10/15 EKG: NSR, rightward axis. Cannot rule out anterior infarct (age undetermined).  11/29/15 Echo: Study Conclusions - Left ventricle: The cavity size was normal. Wall thickness was  normal. Systolic function was normal. The estimated ejection  fraction was in the range of 60% to 65%. Wall motion was normal;  there were no regional wall motion abnormalities. Doppler  parameters are consistent with abnormal left ventricular  relaxation (grade 1 diastolic dysfunction). - Ventricular septum: Mildly D-shaped interventricular septum,  suggesting a degree of RV pressure/volume overload. - Aortic valve: There was no stenosis. - Mitral valve: There was no significant regurgitation. - Right ventricle: The cavity size was mildly dilated. Systolic  function was normal. - Right atrium: The atrium was mildly dilated. - Tricuspid valve: Peak RV-RA gradient (S): 47 mm Hg. - Pulmonary arteries: PA peak pressure: 50 mm Hg (S). - Inferior vena cava: The vessel was normal in size. The  respirophasic diameter changes were in the normal range (>= 50%),  consistent with normal central venous pressure. Impressions: - Normal LV size and systolic function, EF [id number]. Mildly dilated  RV with normal systolic function. D-shaped interventricular   septum, suggesting RV pressure/volume overload. Moderate  pulmonary hypertension.  11/23/15 Nuclear stress test:  The left ventricular ejection fraction is hyperdynamic (>65%).  Nuclear stress EF: 82%.  There was no ST segment deviation noted during stress.  This is a low risk study. No perfusion defects, no ischemia identified.  10/30/01 [facility]: CONCLUSIONS: 1. Nonobstructive coronary artery disease, with  a. 30% narrowing in the proximal and mid left anterior descending artery.  b. No obstruction in the circumflex artery.  c. Mild irregularities in the right coronary artery. 2. Normal left ventricular function.  06/20/15 CXR: IMPRESSION: Stable hyperinflation. No superimposed acute process.  07/21/15 PFTs: FVC 1.76 (80%), FEV1 0.97 (60%), FEV1/FVC ration 55% (74%), DLCOunc 4.71 (24%). Moderate obstruction on spirometry. Normal lung volumes. Severe diffusion defect. No significant bronchodilator responsiveness.   06/15/15 ONO: Baseline spO2 90%, spent 7 min > 88%, not considered significant enough to warrant home O2.  Preoperative labs noted. She could not provide UA at PAT ([facility] notified).  If no acute changes then I anticipate that she can proceed as planned.  George [facility] Short Stay Center/Anesthesiology Phone [phone] 12/23/2015 3:15 PM

## 2015-12-29 ENCOUNTER — Telehealth: Payer: Self-pay | Admitting: Neurology

## 2015-12-29 NOTE — Telephone Encounter (Signed)
Michelle/[facility] called to schedule pt for appt due to TIA's . Pt was supposed to have surgery but has not been able to because of TIA's. Appt has been made for July 10th at 8:30am. Sharyn Lull said she will contact the pt to notify them of appt date and time. May call [phone] for any questions.

## 2015-12-29 NOTE — Telephone Encounter (Signed)
Dr Jaynee Eagles- FYI You have seen this pt in the past.

## 2015-12-31 ENCOUNTER — Encounter (HOSPITAL_COMMUNITY): Admission: RE | Payer: Self-pay | Source: Ambulatory Visit

## 2015-12-31 ENCOUNTER — Inpatient Hospital Stay (HOSPITAL_COMMUNITY): Admission: RE | Admit: 2015-12-31 | Payer: Medicare Other | Source: Ambulatory Visit | Admitting: Vascular Surgery

## 2015-12-31 SURGERY — ANEURYSM ABDOMINAL AORTIC REPAIR
Anesthesia: General

## 2016-01-03 ENCOUNTER — Encounter: Payer: Self-pay | Admitting: Neurology

## 2016-01-03 ENCOUNTER — Ambulatory Visit (INDEPENDENT_AMBULATORY_CARE_PROVIDER_SITE_OTHER): Payer: Medicare Other | Admitting: Neurology

## 2016-01-03 VITALS — BP 132/86 | HR 110 | Ht 60.0 in | Wt 90.2 lb

## 2016-01-03 DIAGNOSIS — G458 Other transient cerebral ischemic attacks and related syndromes: Secondary | ICD-10-CM

## 2016-01-03 DIAGNOSIS — G459 Transient cerebral ischemic attack, unspecified: Secondary | ICD-10-CM | POA: Insufficient documentation

## 2016-01-03 NOTE — Progress Notes (Signed)
[facility]    Provider:  Dr Jaynee Eagles Referring Provider: Derinda Late, MD Primary Care Physician:  Marylene Land, MD  CC: Syncope  Interval history 01/03/2016: Last saw patient about 8 months ago for episodes of altered awareness TIA versus seizures or syncopal episodes. PMHx COPD, Impaired glucose tolerance, bronchitis, HLD, SCC cervix, 2008 breast cancer, osteoporosis. She is on Plavix and a statin for stroke prevention. EEG in the past was negative but she declined a 3-day ambulatory eeg in the past.  She was also evaluated in our office for OSA which was negative however her study revealed severe hypoxemia and a pulmonary consultation was provided by Dr. Brett Fairy for a goal of O2 therapy. She was advised to avoid sedative-hypnotics and to stop smoking. She was also advised by our sleep team too not drive or operate machinery while sleepy. She currently does follow with pulmonary doctor Sood. She smells heavily of smoke today again and apparently has not stopped smoking. She was supposed to have surgery for abdominal aortic aneurysm however while off her Plavix she had an episode of aphasia and surgery was canceled until she could come and see me.  She was in the dining room recently and she started to talk and she made no sense. She could hear what she was saying and it was gibberish. It didn;t make any sense, lasted 30 seciods to a minute. She was aware that she was taking gibberish. There was no alteration of awareness. Patient says she just couldn't get the words out and per husband it sounded like "gibberish". The symptoms resolved. There were no other associated symptoms such as facial droop, weakness, sensory changes. Patient had been off her Plavix at the time pending abdominal aneurysm surgery.    Interval Update 04/28/2015: 82- year old here for follow up. Patient returns today. She was originally seen for syncopal episodes vs TIA. Feels her memory is worsening.  She is excessively tired. She doesn't know if she snores. She wakes up with headaches. She feels very tired, she is exhausted. [name] advised that a sleep study may be indicated. She doesn't fall asleep during the day but she is very tired. The fatigue can affect her daily performance and completion of tasks. She goes to sleep between 10 and 11 and wakes up unil 9am and still feels tired all day even though as far as she know, she is sleeping throughout the night. No problems initiating sleep, denies insomnia. The fatigue is affecting her life significantly.   She has not had anymore syncopal episodes or events. No new episodes of altered mentation or loss of consciousness. No accidents and no falls. She doesn't feel the need for further workup and declines a 3-day ambulatory video eeg. However patient does say if she has another episode that she will let me order an extended VEEG.   HPI: Ruth [name] is a 82 y.o. female here as a referral from [name] for Syncopal episodes vs TIA. PMHx COPD, Impaired glucose tolerance, bronchitis, HLD, SCC cervix, 2008 breast cancer, osteoporosis. She has had a stroke, she is on Plavix and a statin.   Few weeks ago she went to the kitchen and something happened, her right arm felt weak. She slid down the cabinets and wacked her head. She just layed there. She doesn't remember bumping her head or getting on the floor, but she just layed there until she could get up. Maybe 5 minutes doesn't really know. Patient was admitted to the ED  and MRi did not show anything acute.   This has happened before, she has fallen and lost consciousness. One time, husband says she was answering him but she doesn't remember the incident, this was months ago sometime around easter. Another time she was taking a shower and she became altered, started talking "gibberish", she was in "[place]" and daughter called husband(per review of notes, her eyes rolled to the left and afterwards  she slept for several hours). Patient woke up in the ambulance. She was non responsive for 5 minutes. She had no idea what was going on (she was still confused when she got to the ED, per Notes reviewed). She has had one big event but possibly multiple other smaller events when she was alone. No urine loss, no tongue biting. Mother died of a stroke but no personal or family history of a seizures. Unknown triggers, denies etoh, denies drugs. No abnormal movements reported when she was non responsive.   Reviewed notes, labs and imaging from outside physicians, which showed: MRI of the brain w/wo w eeks ago and mri brain w/o contrast in June did not show acute events, (personally reviewed images) showed age-related atrophy and chronic non-specific WM changes. MRA of the head unremarkable but did note a subacute cva possibly. EEG in June 2015 was normal. TSH nml, last CMP with AKI, last CBC unremarkable, hgba1c 5.7. Of note, on review of records it does appear that patient lost control of her bowels when she had the incident in June. 24 hour holter was placed on 12/9. Carotid duplex was 1-39% bilaterally.   Social History   Social History  . Marital Status: Married    Spouse Name: N/A  . Number of Children: N/A  . Years of Education: N/A   Occupational History  . retired    Social History Main Topics  . Smoking status: Current Every Day Smoker -- 1.00 packs/day for 61 years    Types: Cigarettes    Start date: 06/27/1951  . Smokeless tobacco: Never Used  . Alcohol Use: 0.0 oz/week    0 Standard drinks or equivalent per week     Comment: rarely  . Drug Use: No  . Sexual Activity: No   Other Topics Concern  . Not on file   Social History Narrative    Family History  Problem Relation Age of Onset  . Hypertension Mother   . Hypertension Father   . Diabetes Father   . Heart disease Father   . Deep vein thrombosis Sister   . Diabetes Daughter   . Hyperlipidemia Daughter   . Diabetes  Son   . Hyperlipidemia Son   . Seizures Neg Hx     Past Medical History  Diagnosis Date  . Osteoporosis, unspecified 04/01/2013  . Hyperlipidemia 04/01/2013  . Hearing loss 04/01/2013  . Cancer ([place])     cervical / ovarian   . AAA (abdominal aortic aneurysm) ([place])   . COPD (chronic obstructive pulmonary disease) ([place])   . Stroke ([place])   . Syncope and collapse   . Breast cancer ([place])   . Ovarian cancer ([place])   . Skin cancer     Past Surgical History  Procedure Laterality Date  . Abdominal hysterectomy    . Cholecystectomy    . Ovarian cyst removed    . Cholecystectomy    . Breast lumpectomy    . Skin cancer removed      Current Outpatient Prescriptions  Medication Sig Dispense Refill  .  Albuterol Sulfate (PROAIR RESPICLICK) 123XX123 (90 Base) MCG/ACT AEPB Inhale 2 puffs into the lungs daily as needed (shortness of breath).    Marland Kitchen alendronate (FOSAMAX) 70 MG tablet Take 70 mg by mouth once a week. Take with a full glass of water on an empty stomach. On Monday    . calcium carbonate (TUMS EX) 750 MG chewable tablet Chew 1 tablet by mouth daily.     . cholecalciferol (VITAMIN D) 1000 UNITS tablet Take 1 tablet (1,000 Units total) by mouth daily. 90 tablet 4  . clopidogrel (PLAVIX) 75 MG tablet Take 1 tablet (75 mg total) by mouth daily. 30 tablet 11  . fish oil-omega-3 fatty acids 1000 MG capsule Take 1 g by mouth daily.     Marland Kitchen ibuprofen (ADVIL,MOTRIN) 200 MG tablet Take 200 mg by mouth every 6 (six) hours as needed for moderate pain.    . pravastatin (PRAVACHOL) 10 MG tablet Take 10 mg by mouth daily.    . tamoxifen (NOLVADEX) 20 MG tablet Take 1 tablet (20 mg total) by mouth daily. 90 tablet 3  . tiotropium (SPIRIVA) 18 MCG inhalation capsule Place 18 mcg into inhaler and inhale daily.     No current facility-administered medications for this visit.    Allergies as of 01/03/2016  . (No Known Allergies)    Vitals: BP 132/86 mmHg  Pulse 110  Ht 5' (1.524 m)  Wt 90 lb 3.2 oz  (40.914 kg)  BMI 17.62 kg/m2 Last Weight:  Wt Readings from Last 1 Encounters:  01/03/16 90 lb 3.2 oz (40.914 kg)   Last Height:   Ht Readings from Last 1 Encounters:  01/03/16 5' (1.524 m)     Cognition:  The patient is oriented to person, place, and time;   recent and remote memory intact;   language fluent;   normal attention, concentration,   fund of knowledge Cranial Nerves:  The pupils are equal, round, and reactive to light. The fundi are normal and spontaneous venous pulsations are present. Visual fields are full to finger confrontation. Extraocular movements are intact. Trigeminal sensation is intact and the muscles of mastication are normal. The face is symmetric. The palate elevates in the midline. Hearing intact. Voice is normal. Shoulder shrug is normal. The tongue has normal motion without fasciculations.    Motor Observation:  No asymmetry, no atrophy, and no involuntary movements noted. Tone:  Normal muscle tone.   Posture:  Posture is normal. normal erect   Strength:  Strength is V/V in the upper and lower limbs.       Assessment/Plan: 82 year old patient with previous multiple events that are suspicious for TIA vs seizure activity. She had an episode of talking "gibberish" for 30 seconds to 1 minute while off her plavix pending abdominal aneurysm surgery. Likely TIA.   -Aortic Aneurysm needs surgery: She smells heavily of smoke today again and apparently has not stopped smoking. highly recommend cessation. Smoking is high risk for stroke. She was off plavix for the surgery.  Advised if these are TIAs we cannot help her if she doesn't help herself and continues to smoke. Her brain has extensive white matter changes likely extensive chronic microvascular ischemia from smoking, reviewed with her today and showed her images. She wants me to clear her for surgery. Explained risks vs benefit. While she is off of her plavix, she is at  higher risk for stroke. It is at Dr. Luther Parody discretion if she can stay on a baby aspirin while off  of her plavix which will provide some protection but may also increase the risk of bleeding during surgery. If she needs to have the surgery she understands that she is at elevated risk for stroke especially while off of her plavix. Husband and wife acknowledge risks.There is an increased risk of stroke during the first 3 months after a transient ischemic attack (TIA). Patient and husband acknowledged risks.   - She was evaluated in our office for OSA which was negative however her sleep study revealed  Severe hypoxemia and a pulmonary consultation was provided by Dr. Brett Fairy for a goal of O2 therapy. She was advised to avoid sedative-hypnotics and to stop smoking. She was also advised by our sleep team too not drive or operate machinery while sleepy.  She follows with Dr. Halford Chessman, pulmonologist  - Stroke prevemtion: Continue Plavix and statin for stroke prevention. Follow up with PCP for management of risk factors. LDL goal < 70. Again if she needs surgery she understands the risks vs benefits and her increased risk for stroke while off of her plavix. Smoking cessation.  - Seizure evaluation: Routine EEG normal, She declined a 3-day ambulatory EEG in the past.   - She declined repeat MRI of the brain. Previous MRI with generalized cerebral atrophy with advanced chronic microvascular ischemic disease. Repeat MRI would likely not change out management.   A total of 30 minutes was spent with this patient. Over half this time was spent on counseling patient on the TIA diagnosis and different therapeutic options available.   CC: [name] and Dr. Donnetta Hutching.  Sarina Ill, MD  [facility] [address] [place], [place] [address]  Phone [phone] Fax [phone]  A total of 30 minutes was spent face-to-face with this patient. Over half this time was spent on  counseling patient on the syncope and OSA diagnosis and different diagnostic and therapeutic options available.

## 2016-01-03 NOTE — Patient Instructions (Signed)
Remember to drink plenty of fluid, eat healthy meals and do not skip any meals. Try to eat protein with a every meal and eat a healthy snack such as fruit or nuts in between meals. Try to keep a regular sleep-wake schedule and try to exercise daily, particularly in the form of walking, 20-30 minutes a day, if you can.   I would like to see you back in as needed, sooner if we need to. Please call us with any interim questions, concerns, problems, updates or refill requests.   Our phone number is [phone]. We also have an after hours call service for urgent matters and there is a physician on-call for urgent questions. For any emergencies you know to call 911 or go to the nearest emergency room

## 2016-01-05 ENCOUNTER — Other Ambulatory Visit: Payer: Self-pay

## 2016-01-18 ENCOUNTER — Telehealth: Payer: Self-pay

## 2016-01-18 NOTE — Telephone Encounter (Signed)
Pt's husband called with question of need to start taking ASA in place of Plavix, prior to her Open Repair AAA, on 01/25/2016.  Stated the pt. has not taken ASA in approx. 2 years.  Discussed with Dr. Donnetta Hutching.  Per Dr. Donnetta Hutching, the pt. should hold Plavix 5 days prior to surgery, and to start ASA 81 mg qd, while holding the Plavix.  Called pt.  Advised to take last dose of Plavix on 8/1; start ASA 81 mg, on 8/2, and to take it daily up to and including the day of surgery.  Pt. Verbalized understanding.  Also advised that Dr. Donnetta Hutching recommended an appt. With him, prior to surgery.  Appt. Given for 9:30 AM 01/20/16;  Agreed with plan.

## 2016-01-19 ENCOUNTER — Encounter: Payer: Self-pay | Admitting: Vascular Surgery

## 2016-01-20 ENCOUNTER — Encounter: Payer: Self-pay | Admitting: Vascular Surgery

## 2016-01-20 ENCOUNTER — Ambulatory Visit (INDEPENDENT_AMBULATORY_CARE_PROVIDER_SITE_OTHER): Payer: Medicare Other | Admitting: Vascular Surgery

## 2016-01-20 VITALS — BP 145/91 | HR 100 | Resp 14 | Ht 60.0 in | Wt 91.0 lb

## 2016-01-20 DIAGNOSIS — I714 Abdominal aortic aneurysm, without rupture, unspecified: Secondary | ICD-10-CM

## 2016-01-20 NOTE — Progress Notes (Signed)
Vitals:   01/20/16 0946  BP: (!) 144/92  Pulse: 100  Resp: 14  SpO2: 95%  Weight: 91 lb (41.3 kg)  Height: 5' (1.524 m)

## 2016-01-20 NOTE — Progress Notes (Signed)
   Patient name: Ruth [name] MRN: [id number] DOB: 14-Sep-1932 Sex: female  REASON FOR VISIT: Discussion of upcoming open aneurysm repair  HPI: Ruth [name] is a 82 y.o. female here today for discussion of her upcoming aneurysm repair. She had suffered a TIA prior to her scheduled repair. This was postponed and she has had the full neurology workup has been cleared for surgery. She is here today with her husband. She is completely back to her baseline  Current Outpatient Prescriptions  Medication Sig Dispense Refill  . Albuterol Sulfate (PROAIR RESPICLICK) 123XX123 (90 Base) MCG/ACT AEPB Inhale 2 puffs into the lungs [name] as needed (shortness of breath).    Marland Kitchen alendronate (FOSAMAX) 70 MG tablet Take 70 mg by mouth once a week. Take with a full glass of water on an empty stomach. On Monday    . calcium carbonate (TUMS EX) 750 MG chewable tablet Chew 1 tablet by mouth [name].     . cholecalciferol (VITAMIN D) 1000 UNITS tablet Take 1 tablet (1,000 Units total) by mouth [name]. 90 tablet 4  . clopidogrel (PLAVIX) 75 MG tablet Take 1 tablet (75 mg total) by mouth [name]. 30 tablet 11  . fish oil-omega-3 fatty acids 1000 MG capsule Take 1 g by mouth [name].     Marland Kitchen ibuprofen (ADVIL,MOTRIN) 200 MG tablet Take 200 mg by mouth every 6 (six) hours as needed for moderate pain.    . pravastatin (PRAVACHOL) 10 MG tablet Take 10 mg by mouth [name].    Marland Kitchen tiotropium (SPIRIVA) 18 MCG inhalation capsule Place 18 mcg into inhaler and inhale [name].    . tamoxifen (NOLVADEX) 20 MG tablet Take 1 tablet (20 mg total) by mouth [name]. 90 tablet 3   No current facility-administered medications for this visit.      PHYSICAL EXAM: Vitals:   01/20/16 0946 01/20/16 0949  BP: (!) 144/92 (!) 145/91  Pulse: 100   Resp: 14   SpO2: 95%   Weight: 91 lb (41.3 kg)   Height: 5' (1.524 m)     GENERAL: The patient is a well-nourished female, in no acute distress. The vital signs are documented  above. Abdomen soft easily palpable aneurysm which is nontender  MEDICAL ISSUES: Again discussed the upcoming plan for surgery. She is at increased risk due to her COPD. Do not have other options for treatment and they do not want to consider observation only. Again discussed the procedure itself and the postoperative expectation with intensive care unit in hopes Braxton Weisbecker extubation. Also 5 to six-day expected hospitalization. Ready to proceed as scheduled   Rosetta Posner, MD [facility] Tel [phone] Pager [phone]

## 2016-01-24 ENCOUNTER — Encounter (HOSPITAL_COMMUNITY): Payer: Self-pay

## 2016-01-24 ENCOUNTER — Encounter (HOSPITAL_COMMUNITY)
Admission: RE | Admit: 2016-01-24 | Discharge: 2016-01-24 | Disposition: A | Discharge status: Home / Self Care | Payer: Medicare Other | Source: Ambulatory Visit | Attending: Vascular Surgery | Admitting: Vascular Surgery

## 2016-01-24 DIAGNOSIS — I714 Abdominal aortic aneurysm, without rupture: Secondary | ICD-10-CM | POA: Diagnosis not present

## 2016-01-24 DIAGNOSIS — Z01818 Encounter for other preprocedural examination: Secondary | ICD-10-CM | POA: Insufficient documentation

## 2016-01-24 DIAGNOSIS — Z7902 Long term (current) use of antithrombotics/antiplatelets: Secondary | ICD-10-CM | POA: Insufficient documentation

## 2016-01-24 DIAGNOSIS — Z853 Personal history of malignant neoplasm of breast: Secondary | ICD-10-CM | POA: Insufficient documentation

## 2016-01-24 DIAGNOSIS — Z79899 Other long term (current) drug therapy: Secondary | ICD-10-CM | POA: Insufficient documentation

## 2016-01-24 DIAGNOSIS — Z8673 Personal history of transient ischemic attack (TIA), and cerebral infarction without residual deficits: Secondary | ICD-10-CM | POA: Insufficient documentation

## 2016-01-24 DIAGNOSIS — H9190 Unspecified hearing loss, unspecified ear: Secondary | ICD-10-CM | POA: Insufficient documentation

## 2016-01-24 DIAGNOSIS — Z01812 Encounter for preprocedural laboratory examination: Secondary | ICD-10-CM | POA: Insufficient documentation

## 2016-01-24 DIAGNOSIS — J449 Chronic obstructive pulmonary disease, unspecified: Secondary | ICD-10-CM | POA: Diagnosis not present

## 2016-01-24 DIAGNOSIS — Z7983 Long term (current) use of bisphosphonates: Secondary | ICD-10-CM | POA: Diagnosis not present

## 2016-01-24 DIAGNOSIS — Z0183 Encounter for blood typing: Secondary | ICD-10-CM | POA: Insufficient documentation

## 2016-01-24 DIAGNOSIS — E785 Hyperlipidemia, unspecified: Secondary | ICD-10-CM | POA: Diagnosis not present

## 2016-01-24 DIAGNOSIS — Z8541 Personal history of malignant neoplasm of cervix uteri: Secondary | ICD-10-CM | POA: Insufficient documentation

## 2016-01-24 LAB — COMPREHENSIVE METABOLIC PANEL
ALBUMIN: 3.6 g/dL (ref 3.5–5.0)
ALK PHOS: 40 U/L (ref 38–126)
ALT: 14 U/L (ref 14–54)
ANION GAP: 8 (ref 5–15)
AST: 22 U/L (ref 15–41)
BUN: 17 mg/dL (ref 6–20)
CALCIUM: 9.2 mg/dL (ref 8.9–10.3)
CHLORIDE: 105 mmol/L (ref 101–111)
CO2: 24 mmol/L (ref 22–32)
CREATININE: 1.15 mg/dL — AB (ref 0.44–1.00)
GFR calc Af Amer: 51 mL/min — ABNORMAL LOW (ref >60)
GFR calc non Af Amer: 44 mL/min — ABNORMAL LOW (ref >60)
GLUCOSE: 149 mg/dL — AB (ref 65–99)
Potassium: 4 mmol/L (ref 3.5–5.1)
SODIUM: 137 mmol/L (ref 135–145)
Total Bilirubin: 0.5 mg/dL (ref 0.3–1.2)
Total Protein: 6.5 g/dL (ref 6.5–8.1)

## 2016-01-24 LAB — SURGICAL PCR SCREEN
MRSA, PCR: NEGATIVE
Staphylococcus aureus: NEGATIVE

## 2016-01-24 LAB — BLOOD GAS, ARTERIAL
Acid-Base Excess: 3.3 mmol/L — ABNORMAL HIGH (ref 0.0–2.0)
Bicarbonate: 27.4 mEq/L — ABNORMAL HIGH (ref 20.0–24.0)
DRAWN BY: 206361
FIO2: 0.21
O2 SAT: 94.2 %
PATIENT TEMPERATURE: 98.6
TCO2: 28.7 mmol/L (ref 0–100)
pCO2 arterial: 42.8 mmHg (ref 35.0–45.0)
pH, Arterial: 7.422 (ref 7.350–7.450)
pO2, Arterial: 69.2 mmHg — ABNORMAL LOW (ref 80.0–100.0)

## 2016-01-24 LAB — CBC
HCT: 45.4 % (ref 36.0–46.0)
HEMOGLOBIN: 14.9 g/dL (ref 12.0–15.0)
MCH: 32.1 pg (ref 26.0–34.0)
MCHC: 32.8 g/dL (ref 30.0–36.0)
MCV: 97.8 fL (ref 78.0–100.0)
PLATELETS: 151 10*3/uL (ref 150–400)
RBC: 4.64 MIL/uL (ref 3.87–5.11)
RDW: 13.5 % (ref 11.5–15.5)
WBC: 8.7 10*3/uL (ref 4.0–10.5)

## 2016-01-24 LAB — PROTIME-INR
INR: 1.02
Prothrombin Time: 13.4 seconds (ref 11.4–15.2)

## 2016-01-24 LAB — APTT: APTT: 28 s (ref 24–36)

## 2016-01-24 NOTE — Progress Notes (Signed)
Pt unable to obtain urine specimen at PAT appointment, order placed for DOS

## 2016-01-24 NOTE — Progress Notes (Signed)
PCP: Dr. Derinda Late Cardiologist: Dr. Gwenlyn Found  EKG: 11/10/15 CXR 06/20/15 Echo: 11/29/15 Stress test: 11/23/15  Pt and husband unsure of whether she has had a cardiac cath.  No complaints of illness, fever, shortness of breath or chest pain. Pt already given instructions on stopping Plavix 01/25/16 and starting 81mg  daily ASA until DOS.

## 2016-01-24 NOTE — Pre-Procedure Instructions (Signed)
Ruth [name]  01/24/2016      [facility] [address] [place] [address] Phone: [phone] Fax: [phone]    Your procedure is scheduled on Monday, August 7.   Report to [facility] Admitting at 5:30 A.M.   Call this number if you have problems the morning of surgery:  [phone]   Remember:  Do not eat food or drink liquids after midnight.  Take these medicines the morning of surgery with A SIP OF WATER: Aspirin, tamoxifen (nolvadex), tiotropium (Spiriva)  STOP taking TODAY: any Aleve, Naproxen, Ibuprofen, Motrin, Advil, Goody's, BC's, all herbal medications, fish oil, and all vitamins  Follow your doctor's instructions regarding Plavix and Aspirin.    Do not wear jewelry, make-up or nail polish.  Do not wear lotions, powders, or perfumes.  You may NOT wear deoderant.  Do not shave 48 hours prior to surgery.    Do not bring valuables to the hospital.  [facility] is not responsible for any belongings or valuables.  Contacts, dentures or bridgework may not be worn into surgery.  Leave your suitcase in the car.  After surgery it may be brought to your room.  For patients admitted to the hospital, discharge time will be determined by your treatment team.  Patients discharged the day of surgery will not be allowed to drive home.    Special instructions:   [place]- Preparing For Surgery  Before surgery, you can play an important role. Because skin is not sterile, your skin needs to be as free of germs as possible. You can reduce the number of germs on your skin by washing with CHG (chlorahexidine gluconate) Soap before surgery.  CHG is an antiseptic cleaner which kills germs and bonds with the skin to continue killing germs even after washing.  Please do not use if you have an allergy to CHG or antibacterial soaps. If your skin becomes reddened/irritated stop  using the CHG.  Do not shave (including legs and underarms) for at least 48 hours prior to first CHG shower. It is OK to shave your face.  Please follow these instructions carefully.   1. Shower the NIGHT BEFORE SURGERY and the MORNING OF SURGERY with CHG.   2. If you chose to wash your hair, wash your hair first as usual with your normal shampoo.  3. After you shampoo, rinse your hair and body thoroughly to remove the shampoo.  4. Use CHG as you would any other liquid soap. You can apply CHG directly to the skin and wash gently with a scrungie or a clean washcloth.   5. Apply the CHG Soap to your body ONLY FROM THE NECK DOWN.  Do not use on open wounds or open sores. Avoid contact with your eyes, ears, mouth and genitals (private parts). Wash genitals (private parts) with your normal soap.  6. Wash thoroughly, paying special attention to the area where your surgery will be performed.  7. Thoroughly rinse your body with warm water from the neck down.  8. DO NOT shower/wash with your normal soap after using and rinsing off the CHG Soap.  9. Pat yourself dry with a CLEAN TOWEL.   10. Wear CLEAN PAJAMAS   11. Place CLEAN SHEETS on your bed the night of your first shower and DO NOT SLEEP WITH PETS.    Day of Surgery: Do not apply  any deodorants/lotions. Please wear clean clothes to the hospital/surgery center.      Please read over the following fact sheets that you were given. MRSA Information

## 2016-01-25 NOTE — Progress Notes (Signed)
Anesthesia Chart Review:   Patient is an 81 year old female scheduled for open AAA repair on 02/17/2016 by Dr. Curt Jews. She is not a candidate for endovascular repair.  Pt was originally scheduled for surgery 12/31/15 but it was postponed due to TIAs. Pt saw Sarina Ill, MD with neurology 01/03/16 and increased risk of stroke while off of plavix was explained. Dr. Jaynee Eagles advised pt take baby aspirin perioperatively while off plavix.   History includes smoking, HLD, AAA (5.4 X 5.0 cm), syncope with CVA '15, left breast cancer s/p lumpectomy '08, ovarian/cervical cancer s/p hysterectomy, COPD, hearing loss, cholecystectomy.   PCP is Dr. Derinda Late. Cardiologist is Dr. Gwenlyn Found (seen for pre-operative evaluation 11/10/15) who gave permission to hold Plavix 5 days prior to surgery. Stress and echo done (see below). Pulmonologist is Dr. Halford Chessman.            Meds include albuterol, Fosamax, Plavix, fish oil, tamoxifen, Spiriva. Pt is to be taking ASA.   11/10/15 EKG: NSR, rightward axis. Cannot rule out anterior infarct (age undetermined).  11/29/15 Echo: Study Conclusions - Left ventricle: The cavity size was normal. Wall thickness was  normal. Systolic function was normal. The estimated ejection  fraction was in the range of 60% to 65%. Wall motion was normal;  there were no regional wall motion abnormalities. Doppler  parameters are consistent with abnormal left ventricular  relaxation (grade 1 diastolic dysfunction). - Ventricular septum: Mildly D-shaped interventricular septum,  suggesting a degree of RV pressure/volume overload. - Aortic valve: There was no stenosis. - Mitral valve: There was no significant regurgitation. - Right ventricle: The cavity size was mildly dilated. Systolic  function was normal. - Right atrium: The atrium was mildly dilated. - Tricuspid valve: Peak RV-RA gradient (S): 47 mm Hg. - Pulmonary arteries: PA peak pressure: 50 mm Hg (S). - Inferior vena cava:  The vessel was normal in size. The  respirophasic diameter changes were in the normal range (>= 50%),  consistent with normal central venous pressure. Impressions: - Normal LV size and systolic function, EF [id number]. Mildly dilated  RV with normal systolic function. D-shaped interventricular  septum, suggesting RV pressure/volume overload. Moderate  pulmonary hypertension.  11/23/15 Nuclear stress test:  The left ventricular ejection fraction is hyperdynamic (>65%).  Nuclear stress EF: 82%.  There was no ST segment deviation noted during stress.  This is a low risk study. No perfusion defects, no ischemia identified.  10/30/01 [facility]: CONCLUSIONS: 1. Nonobstructive coronary artery disease, with  a. 30% narrowing in the proximal and mid left anterior descending artery.  b. No obstruction in the circumflex artery.  c. Mild irregularities in the right coronary artery. 2. Normal left ventricular function.  06/20/15 CXR: IMPRESSION: Stable hyperinflation. No superimposed acute process.  07/21/15 PFTs: FVC 1.76 (80%), FEV1 0.97 (60%), FEV1/FVC ration 55% (74%), DLCOunc 4.71 (24%). Moderate obstruction on spirometry. Normal lung volumes. Severe diffusion defect. No significant bronchodilator responsiveness.   06/15/15 ONO: Baseline spO2 90%, spent 7 min > 88%, not considered significant enough to warrant home O2.  Preoperative labs noted. She could not provide UA at PAT so it will be obtained DOS.   If no changes, I anticipate pt can proceed with surgery as scheduled.   Willeen Cass, FNP-BC [facility] Short Stay Surgical Center/Anesthesiology Phone: [phone] 01/25/2016 3:55 PM

## 2016-01-31 ENCOUNTER — Inpatient Hospital Stay (HOSPITAL_COMMUNITY): Payer: Medicare Other

## 2016-01-31 ENCOUNTER — Encounter (HOSPITAL_COMMUNITY): Payer: Self-pay | Admitting: *Deleted

## 2016-01-31 ENCOUNTER — Inpatient Hospital Stay (HOSPITAL_COMMUNITY): Payer: Medicare Other | Admitting: Certified Registered Nurse Anesthetist

## 2016-01-31 ENCOUNTER — Inpatient Hospital Stay (HOSPITAL_COMMUNITY)
Admission: RE | Admit: 2016-01-31 | Discharge: 2016-02-25 | DRG: 268 | Disposition: E | Payer: Medicare Other | Source: Ambulatory Visit | Attending: Vascular Surgery | Admitting: Vascular Surgery

## 2016-01-31 ENCOUNTER — Encounter (HOSPITAL_COMMUNITY): Admission: RE | Disposition: E | Payer: Self-pay | Source: Ambulatory Visit | Attending: Vascular Surgery

## 2016-01-31 ENCOUNTER — Inpatient Hospital Stay (HOSPITAL_COMMUNITY): Payer: Medicare Other | Admitting: Emergency Medicine

## 2016-01-31 DIAGNOSIS — R059 Cough, unspecified: Secondary | ICD-10-CM

## 2016-01-31 DIAGNOSIS — J9601 Acute respiratory failure with hypoxia: Secondary | ICD-10-CM

## 2016-01-31 DIAGNOSIS — J449 Chronic obstructive pulmonary disease, unspecified: Secondary | ICD-10-CM | POA: Diagnosis not present

## 2016-01-31 DIAGNOSIS — D696 Thrombocytopenia, unspecified: Secondary | ICD-10-CM | POA: Diagnosis not present

## 2016-01-31 DIAGNOSIS — J9690 Respiratory failure, unspecified, unspecified whether with hypoxia or hypercapnia: Secondary | ICD-10-CM

## 2016-01-31 DIAGNOSIS — I714 Abdominal aortic aneurysm, without rupture, unspecified: Secondary | ICD-10-CM | POA: Diagnosis present

## 2016-01-31 DIAGNOSIS — R05 Cough: Secondary | ICD-10-CM

## 2016-01-31 DIAGNOSIS — R5381 Other malaise: Secondary | ICD-10-CM | POA: Diagnosis not present

## 2016-01-31 DIAGNOSIS — Z9889 Other specified postprocedural states: Secondary | ICD-10-CM | POA: Diagnosis not present

## 2016-01-31 DIAGNOSIS — K5900 Constipation, unspecified: Secondary | ICD-10-CM | POA: Diagnosis not present

## 2016-01-31 DIAGNOSIS — D72829 Elevated white blood cell count, unspecified: Secondary | ICD-10-CM | POA: Diagnosis present

## 2016-01-31 DIAGNOSIS — E785 Hyperlipidemia, unspecified: Secondary | ICD-10-CM | POA: Diagnosis present

## 2016-01-31 DIAGNOSIS — C50919 Malignant neoplasm of unspecified site of unspecified female breast: Secondary | ICD-10-CM | POA: Diagnosis present

## 2016-01-31 DIAGNOSIS — R001 Bradycardia, unspecified: Secondary | ICD-10-CM | POA: Diagnosis not present

## 2016-01-31 DIAGNOSIS — Z781 Physical restraint status: Secondary | ICD-10-CM | POA: Diagnosis not present

## 2016-01-31 DIAGNOSIS — D62 Acute posthemorrhagic anemia: Secondary | ICD-10-CM | POA: Diagnosis not present

## 2016-01-31 DIAGNOSIS — M810 Age-related osteoporosis without current pathological fracture: Secondary | ICD-10-CM | POA: Diagnosis present

## 2016-01-31 DIAGNOSIS — Z79899 Other long term (current) drug therapy: Secondary | ICD-10-CM

## 2016-01-31 DIAGNOSIS — Z79810 Long term (current) use of selective estrogen receptor modulators (SERMs): Secondary | ICD-10-CM

## 2016-01-31 DIAGNOSIS — Y95 Nosocomial condition: Secondary | ICD-10-CM | POA: Diagnosis not present

## 2016-01-31 DIAGNOSIS — R34 Anuria and oliguria: Secondary | ICD-10-CM | POA: Diagnosis not present

## 2016-01-31 DIAGNOSIS — N179 Acute kidney failure, unspecified: Secondary | ICD-10-CM | POA: Diagnosis not present

## 2016-01-31 DIAGNOSIS — Z8679 Personal history of other diseases of the circulatory system: Secondary | ICD-10-CM | POA: Diagnosis not present

## 2016-01-31 DIAGNOSIS — T82868A Thrombosis of vascular prosthetic devices, implants and grafts, initial encounter: Secondary | ICD-10-CM | POA: Diagnosis not present

## 2016-01-31 DIAGNOSIS — R0600 Dyspnea, unspecified: Secondary | ICD-10-CM | POA: Diagnosis not present

## 2016-01-31 DIAGNOSIS — Z7983 Long term (current) use of bisphosphonates: Secondary | ICD-10-CM

## 2016-01-31 DIAGNOSIS — J9620 Acute and chronic respiratory failure, unspecified whether with hypoxia or hypercapnia: Secondary | ICD-10-CM | POA: Diagnosis not present

## 2016-01-31 DIAGNOSIS — J9621 Acute and chronic respiratory failure with hypoxia: Secondary | ICD-10-CM | POA: Diagnosis not present

## 2016-01-31 DIAGNOSIS — R739 Hyperglycemia, unspecified: Secondary | ICD-10-CM | POA: Diagnosis not present

## 2016-01-31 DIAGNOSIS — Y848 Other medical procedures as the cause of abnormal reaction of the patient, or of later complication, without mention of misadventure at the time of the procedure: Secondary | ICD-10-CM | POA: Diagnosis not present

## 2016-01-31 DIAGNOSIS — N170 Acute kidney failure with tubular necrosis: Secondary | ICD-10-CM | POA: Diagnosis not present

## 2016-01-31 DIAGNOSIS — I10 Essential (primary) hypertension: Secondary | ICD-10-CM | POA: Diagnosis present

## 2016-01-31 DIAGNOSIS — M7989 Other specified soft tissue disorders: Secondary | ICD-10-CM | POA: Diagnosis not present

## 2016-01-31 DIAGNOSIS — Z7902 Long term (current) use of antithrombotics/antiplatelets: Secondary | ICD-10-CM

## 2016-01-31 DIAGNOSIS — J189 Pneumonia, unspecified organism: Secondary | ICD-10-CM | POA: Diagnosis not present

## 2016-01-31 DIAGNOSIS — F05 Delirium due to known physiological condition: Secondary | ICD-10-CM | POA: Diagnosis not present

## 2016-01-31 DIAGNOSIS — R0902 Hypoxemia: Secondary | ICD-10-CM

## 2016-01-31 DIAGNOSIS — Z681 Body mass index (BMI) 19 or less, adult: Secondary | ICD-10-CM

## 2016-01-31 DIAGNOSIS — I82621 Acute embolism and thrombosis of deep veins of right upper extremity: Secondary | ICD-10-CM | POA: Diagnosis not present

## 2016-01-31 DIAGNOSIS — Z720 Tobacco use: Secondary | ICD-10-CM | POA: Diagnosis not present

## 2016-01-31 DIAGNOSIS — Z01818 Encounter for other preprocedural examination: Secondary | ICD-10-CM

## 2016-01-31 DIAGNOSIS — Z8673 Personal history of transient ischemic attack (TIA), and cerebral infarction without residual deficits: Secondary | ICD-10-CM | POA: Diagnosis not present

## 2016-01-31 DIAGNOSIS — G9340 Encephalopathy, unspecified: Secondary | ICD-10-CM

## 2016-01-31 DIAGNOSIS — E872 Acidosis: Secondary | ICD-10-CM | POA: Diagnosis not present

## 2016-01-31 DIAGNOSIS — F17200 Nicotine dependence, unspecified, uncomplicated: Secondary | ICD-10-CM

## 2016-01-31 DIAGNOSIS — Z515 Encounter for palliative care: Secondary | ICD-10-CM | POA: Diagnosis not present

## 2016-01-31 DIAGNOSIS — E876 Hypokalemia: Secondary | ICD-10-CM | POA: Diagnosis not present

## 2016-01-31 DIAGNOSIS — I951 Orthostatic hypotension: Secondary | ICD-10-CM | POA: Diagnosis not present

## 2016-01-31 DIAGNOSIS — G9341 Metabolic encephalopathy: Secondary | ICD-10-CM | POA: Diagnosis not present

## 2016-01-31 DIAGNOSIS — E43 Unspecified severe protein-calorie malnutrition: Secondary | ICD-10-CM | POA: Diagnosis present

## 2016-01-31 DIAGNOSIS — R579 Shock, unspecified: Secondary | ICD-10-CM | POA: Diagnosis not present

## 2016-01-31 DIAGNOSIS — R140 Abdominal distension (gaseous): Secondary | ICD-10-CM

## 2016-01-31 DIAGNOSIS — Z66 Do not resuscitate: Secondary | ICD-10-CM | POA: Diagnosis present

## 2016-01-31 DIAGNOSIS — Z4659 Encounter for fitting and adjustment of other gastrointestinal appliance and device: Secondary | ICD-10-CM

## 2016-01-31 DIAGNOSIS — R627 Adult failure to thrive: Secondary | ICD-10-CM | POA: Diagnosis not present

## 2016-01-31 DIAGNOSIS — J9600 Acute respiratory failure, unspecified whether with hypoxia or hypercapnia: Secondary | ICD-10-CM

## 2016-01-31 DIAGNOSIS — B965 Pseudomonas (aeruginosa) (mallei) (pseudomallei) as the cause of diseases classified elsewhere: Secondary | ICD-10-CM | POA: Diagnosis present

## 2016-01-31 DIAGNOSIS — Z85828 Personal history of other malignant neoplasm of skin: Secondary | ICD-10-CM

## 2016-01-31 DIAGNOSIS — Z8543 Personal history of malignant neoplasm of ovary: Secondary | ICD-10-CM

## 2016-01-31 HISTORY — PX: ABDOMINAL AORTIC ANEURYSM REPAIR: SHX42

## 2016-01-31 LAB — URINE MICROSCOPIC-ADD ON

## 2016-01-31 LAB — BASIC METABOLIC PANEL
Anion gap: 4 — ABNORMAL LOW (ref 5–15)
BUN: 15 mg/dL (ref 6–20)
CHLORIDE: 107 mmol/L (ref 101–111)
CO2: 25 mmol/L (ref 22–32)
CREATININE: 1.09 mg/dL — AB (ref 0.44–1.00)
Calcium: 7.9 mg/dL — ABNORMAL LOW (ref 8.9–10.3)
GFR calc Af Amer: 54 mL/min — ABNORMAL LOW (ref >60)
GFR calc non Af Amer: 47 mL/min — ABNORMAL LOW (ref >60)
GLUCOSE: 201 mg/dL — AB (ref 65–99)
Potassium: 3.8 mmol/L (ref 3.5–5.1)
SODIUM: 136 mmol/L (ref 135–145)

## 2016-01-31 LAB — BLOOD GAS, ARTERIAL
Acid-base deficit: 0.9 mmol/L (ref 0.0–2.0)
BICARBONATE: 25.1 meq/L — AB (ref 20.0–24.0)
O2 Saturation: 91.6 %
PATIENT TEMPERATURE: 97.2
PH ART: 7.284 — AB (ref 7.350–7.450)
PO2 ART: 69.5 mmHg — AB (ref 80.0–100.0)
TCO2: 26.8 mmol/L (ref 0–100)
pCO2 arterial: 53.8 mmHg — ABNORMAL HIGH (ref 35.0–45.0)

## 2016-01-31 LAB — POCT I-STAT 3, ART BLOOD GAS (G3+)
ACID-BASE EXCESS: 2 mmol/L (ref 0.0–2.0)
BICARBONATE: 28 meq/L — AB (ref 20.0–24.0)
O2 Saturation: 100 %
TCO2: 30 mmol/L (ref 0–100)
pCO2 arterial: 50.8 mmHg — ABNORMAL HIGH (ref 35.0–45.0)
pH, Arterial: 7.349 — ABNORMAL LOW (ref 7.350–7.450)
pO2, Arterial: 202 mmHg — ABNORMAL HIGH (ref 80.0–100.0)

## 2016-01-31 LAB — POCT I-STAT 7, (LYTES, BLD GAS, ICA,H+H)
Acid-base deficit: 4 mmol/L — ABNORMAL HIGH (ref 0.0–2.0)
Bicarbonate: 22.9 mEq/L (ref 20.0–24.0)
CALCIUM ION: 1.16 mmol/L (ref 1.12–1.23)
HEMATOCRIT: 32 % — AB (ref 36.0–46.0)
HEMOGLOBIN: 10.9 g/dL — AB (ref 12.0–15.0)
O2 Saturation: 91 %
PCO2 ART: 48.4 mmHg — AB (ref 35.0–45.0)
POTASSIUM: 3.7 mmol/L (ref 3.5–5.1)
SODIUM: 140 mmol/L (ref 135–145)
TCO2: 24 mmol/L (ref 0–100)
pH, Arterial: 7.283 — ABNORMAL LOW (ref 7.350–7.450)
pO2, Arterial: 70 mmHg — ABNORMAL LOW (ref 80.0–100.0)

## 2016-01-31 LAB — URINALYSIS, ROUTINE W REFLEX MICROSCOPIC
Bilirubin Urine: NEGATIVE
Glucose, UA: NEGATIVE mg/dL
Hgb urine dipstick: NEGATIVE
Ketones, ur: NEGATIVE mg/dL
LEUKOCYTES UA: NEGATIVE
Nitrite: NEGATIVE
PH: 6.5 (ref 5.0–8.0)
Protein, ur: NEGATIVE mg/dL
SPECIFIC GRAVITY, URINE: 1.017 (ref 1.005–1.030)

## 2016-01-31 LAB — GLUCOSE, CAPILLARY
GLUCOSE-CAPILLARY: 170 mg/dL — AB (ref 65–99)
Glucose-Capillary: 207 mg/dL — ABNORMAL HIGH (ref 65–99)
Glucose-Capillary: 206 mg/dL — ABNORMAL HIGH (ref 65–99)

## 2016-01-31 LAB — CBC
HCT: 35.2 % — ABNORMAL LOW (ref 36.0–46.0)
Hemoglobin: 11.2 g/dL — ABNORMAL LOW (ref 12.0–15.0)
MCH: 31.4 pg (ref 26.0–34.0)
MCHC: 31.8 g/dL (ref 30.0–36.0)
MCV: 98.6 fL (ref 78.0–100.0)
PLATELETS: 148 10*3/uL — AB (ref 150–400)
RBC: 3.57 MIL/uL — AB (ref 3.87–5.11)
RDW: 13.4 % (ref 11.5–15.5)
WBC: 20.4 10*3/uL — AB (ref 4.0–10.5)

## 2016-01-31 LAB — MAGNESIUM: Magnesium: 1.6 mg/dL — ABNORMAL LOW (ref 1.7–2.4)

## 2016-01-31 LAB — PROTIME-INR
INR: 1.27
PROTHROMBIN TIME: 16 s — AB (ref 11.4–15.2)

## 2016-01-31 LAB — APTT: aPTT: 28 seconds (ref 24–36)

## 2016-01-31 SURGERY — ANEURYSM ABDOMINAL AORTIC REPAIR
Anesthesia: General | Site: Abdomen

## 2016-01-31 MED ORDER — ROCURONIUM BROMIDE 10 MG/ML (PF) SYRINGE
PREFILLED_SYRINGE | INTRAVENOUS | Status: AC
  Filled 2016-01-31: qty 10

## 2016-01-31 MED ORDER — CETYLPYRIDINIUM CHLORIDE 0.05 % MT LIQD
7.0000 mL | Freq: Four times a day (QID) | OROMUCOSAL | Status: DC
  Administered 2016-01-31: 7 mL via OROMUCOSAL

## 2016-01-31 MED ORDER — OXYMETAZOLINE HCL 0.05 % NA SOLN
NASAL | Status: AC
  Filled 2016-01-31: qty 15

## 2016-01-31 MED ORDER — LACTATED RINGERS IV SOLN
INTRAVENOUS | Status: DC | PRN
  Administered 2016-01-31: 07:00:00 via INTRAVENOUS

## 2016-01-31 MED ORDER — PROTAMINE SULFATE 10 MG/ML IV SOLN
INTRAVENOUS | Status: DC | PRN
  Administered 2016-01-31: 10 mg via INTRAVENOUS
  Administered 2016-01-31 (×2): 20 mg via INTRAVENOUS

## 2016-01-31 MED ORDER — NITROGLYCERIN 0.2 MG/ML ON CALL CATH LAB
INTRAVENOUS | Status: DC | PRN
  Administered 2016-01-31 (×5): 20 ug via INTRAVENOUS

## 2016-01-31 MED ORDER — PHENYLEPHRINE 40 MCG/ML (10ML) SYRINGE FOR IV PUSH (FOR BLOOD PRESSURE SUPPORT)
PREFILLED_SYRINGE | INTRAVENOUS | Status: AC
  Filled 2016-01-31: qty 10

## 2016-01-31 MED ORDER — PROPOFOL 10 MG/ML IV BOLUS
INTRAVENOUS | Status: DC | PRN
  Administered 2016-01-31: 60 mg via INTRAVENOUS

## 2016-01-31 MED ORDER — FAMOTIDINE IN NACL 20-0.9 MG/50ML-% IV SOLN
20.0000 mg | INTRAVENOUS | Status: DC
  Administered 2016-02-01 – 2016-02-07 (×6): 20 mg via INTRAVENOUS
  Filled 2016-01-31 (×8): qty 50

## 2016-01-31 MED ORDER — PHENOL 1.4 % MT LIQD: 1.0000 | OROMUCOSAL | Status: DC | PRN

## 2016-01-31 MED ORDER — INSULIN ASPART 100 UNIT/ML ~~LOC~~ SOLN
0.0000 [IU] | SUBCUTANEOUS | Status: DC
  Administered 2016-01-31: 8 [IU] via SUBCUTANEOUS
  Administered 2016-01-31: 4 [IU] via SUBCUTANEOUS
  Administered 2016-01-31: 8 [IU] via SUBCUTANEOUS
  Administered 2016-02-01 – 2016-02-04 (×12): 2 [IU] via SUBCUTANEOUS
  Administered 2016-02-04 – 2016-02-05 (×2): 4 [IU] via SUBCUTANEOUS
  Administered 2016-02-05 – 2016-02-06 (×5): 2 [IU] via SUBCUTANEOUS
  Administered 2016-02-06: 4 [IU] via SUBCUTANEOUS
  Administered 2016-02-06 – 2016-02-07 (×2): 2 [IU] via SUBCUTANEOUS
  Administered 2016-02-07: 8 [IU] via SUBCUTANEOUS
  Administered 2016-02-07 (×2): 2 [IU] via SUBCUTANEOUS

## 2016-01-31 MED ORDER — MANNITOL 25 % IV SOLN
INTRAVENOUS | Status: DC | PRN
  Administered 2016-01-31: 25 g via INTRAVENOUS

## 2016-01-31 MED ORDER — ACETAMINOPHEN 325 MG RE SUPP: 325.0000 mg | RECTAL | Status: DC | PRN

## 2016-01-31 MED ORDER — FAMOTIDINE IN NACL 20-0.9 MG/50ML-% IV SOLN
20.0000 mg | Freq: Two times a day (BID) | INTRAVENOUS | Status: DC
  Administered 2016-01-31: 20 mg via INTRAVENOUS
  Filled 2016-01-31: qty 50

## 2016-01-31 MED ORDER — MEPERIDINE HCL 25 MG/ML IJ SOLN: 6.2500 mg | INTRAMUSCULAR | Status: DC | PRN

## 2016-01-31 MED ORDER — CHLORHEXIDINE GLUCONATE CLOTH 2 % EX PADS: 6.0000 | MEDICATED_PAD | Freq: Once | CUTANEOUS | Status: DC

## 2016-01-31 MED ORDER — ENOXAPARIN SODIUM 30 MG/0.3ML ~~LOC~~ SOLN
30.0000 mg | SUBCUTANEOUS | Status: DC
  Administered 2016-02-01 – 2016-02-07 (×7): 30 mg via SUBCUTANEOUS
  Filled 2016-01-31 (×7): qty 0.3

## 2016-01-31 MED ORDER — CETYLPYRIDINIUM CHLORIDE 0.05 % MT LIQD
7.0000 mL | Freq: Two times a day (BID) | OROMUCOSAL | Status: DC
  Administered 2016-02-01 – 2016-02-05 (×10): 7 mL via OROMUCOSAL

## 2016-01-31 MED ORDER — ALUM & MAG HYDROXIDE-SIMETH 200-200-20 MG/5ML PO SUSP: 15.0000 mL | ORAL | Status: DC | PRN

## 2016-01-31 MED ORDER — ONDANSETRON HCL 4 MG/2ML IJ SOLN: 4.0000 mg | Freq: Once | INTRAMUSCULAR | Status: DC | PRN

## 2016-01-31 MED ORDER — LIDOCAINE 2% (20 MG/ML) 5 ML SYRINGE
INTRAMUSCULAR | Status: AC
  Filled 2016-01-31: qty 5

## 2016-01-31 MED ORDER — TIOTROPIUM BROMIDE MONOHYDRATE 18 MCG IN CAPS
18.0000 ug | ORAL_CAPSULE | Freq: Every day | RESPIRATORY_TRACT | Status: DC
  Administered 2016-02-01 – 2016-02-02 (×2): 18 ug via RESPIRATORY_TRACT
  Filled 2016-01-31: qty 5

## 2016-01-31 MED ORDER — SUGAMMADEX SODIUM 200 MG/2ML IV SOLN
INTRAVENOUS | Status: AC
  Filled 2016-01-31: qty 2

## 2016-01-31 MED ORDER — PROPOFOL 10 MG/ML IV BOLUS
INTRAVENOUS | Status: AC
  Filled 2016-01-31: qty 20

## 2016-01-31 MED ORDER — HEPARIN SODIUM (PORCINE) 1000 UNIT/ML IJ SOLN
INTRAMUSCULAR | Status: DC | PRN
  Administered 2016-01-31: 5000 [IU] via INTRAVENOUS

## 2016-01-31 MED ORDER — HYDRALAZINE HCL 20 MG/ML IJ SOLN: 5.0000 mg | INTRAMUSCULAR | Status: DC | PRN

## 2016-01-31 MED ORDER — ROCURONIUM BROMIDE 100 MG/10ML IV SOLN
INTRAVENOUS | Status: DC | PRN
  Administered 2016-01-31: 20 mg via INTRAVENOUS
  Administered 2016-01-31: 50 mg via INTRAVENOUS

## 2016-01-31 MED ORDER — SODIUM CHLORIDE 0.9 % IV SOLN: INTRAVENOUS | Status: DC

## 2016-01-31 MED ORDER — FENTANYL CITRATE (PF) 250 MCG/5ML IJ SOLN
INTRAMUSCULAR | Status: AC
  Filled 2016-01-31: qty 5

## 2016-01-31 MED ORDER — ARTIFICIAL TEARS OP OINT
TOPICAL_OINTMENT | OPHTHALMIC | Status: DC | PRN
  Administered 2016-01-31: 1 via OPHTHALMIC

## 2016-01-31 MED ORDER — MIDAZOLAM HCL 2 MG/2ML IJ SOLN
INTRAMUSCULAR | Status: AC
  Filled 2016-01-31: qty 2

## 2016-01-31 MED ORDER — PHENYLEPHRINE HCL 10 MG/ML IJ SOLN
INTRAMUSCULAR | Status: DC | PRN
  Administered 2016-01-31: 40 ug via INTRAVENOUS

## 2016-01-31 MED ORDER — SODIUM CHLORIDE 0.9 % IV SOLN
500.0000 mL | Freq: Once | INTRAVENOUS | Status: AC | PRN
  Administered 2016-02-01: 500 mL via INTRAVENOUS

## 2016-01-31 MED ORDER — HYDROMORPHONE HCL 1 MG/ML IJ SOLN: 0.2500 mg | INTRAMUSCULAR | Status: DC | PRN

## 2016-01-31 MED ORDER — DOCUSATE SODIUM 100 MG PO CAPS: 100.0000 mg | ORAL_CAPSULE | Freq: Every day | ORAL | Status: DC

## 2016-01-31 MED ORDER — POTASSIUM CHLORIDE CRYS ER 20 MEQ PO TBCR: 20.0000 meq | EXTENDED_RELEASE_TABLET | Freq: Every day | ORAL | Status: DC | PRN

## 2016-01-31 MED ORDER — DEXTROSE 5 % IV SOLN
1.5000 g | INTRAVENOUS | Status: AC
  Administered 2016-01-31: 1.5 g via INTRAVENOUS
  Filled 2016-01-31: qty 1.5

## 2016-01-31 MED ORDER — SUGAMMADEX SODIUM 200 MG/2ML IV SOLN
INTRAVENOUS | Status: DC | PRN
  Administered 2016-01-31: 100 mg via INTRAVENOUS

## 2016-01-31 MED ORDER — SODIUM CHLORIDE 0.9 % IV SOLN
INTRAVENOUS | Status: DC | PRN
  Administered 2016-01-31: 500 mL

## 2016-01-31 MED ORDER — MAGNESIUM SULFATE 2 GM/50ML IV SOLN
2.0000 g | Freq: Every day | INTRAVENOUS | Status: AC | PRN
  Administered 2016-01-31: 2 g via INTRAVENOUS
  Filled 2016-01-31 (×2): qty 50

## 2016-01-31 MED ORDER — OXYMETAZOLINE HCL 0.05 % NA SOLN
NASAL | Status: DC | PRN
  Administered 2016-01-31: 2 via NASAL

## 2016-01-31 MED ORDER — DEXTROSE 5 % IV SOLN
1.5000 g | Freq: Two times a day (BID) | INTRAVENOUS | Status: AC
  Administered 2016-01-31 – 2016-02-01 (×2): 1.5 g via INTRAVENOUS
  Filled 2016-01-31 (×2): qty 1.5

## 2016-01-31 MED ORDER — NITROGLYCERIN IN D5W 200-5 MCG/ML-% IV SOLN
INTRAVENOUS | Status: DC | PRN
  Administered 2016-01-31: 5 ug/min via INTRAVENOUS

## 2016-01-31 MED ORDER — ONDANSETRON HCL 4 MG/2ML IJ SOLN
INTRAMUSCULAR | Status: AC
  Filled 2016-01-31: qty 2

## 2016-01-31 MED ORDER — GUAIFENESIN-DM 100-10 MG/5ML PO SYRP: 15.0000 mL | ORAL_SOLUTION | ORAL | Status: DC | PRN

## 2016-01-31 MED ORDER — ARTIFICIAL TEARS OP OINT
TOPICAL_OINTMENT | OPHTHALMIC | Status: AC
  Filled 2016-01-31: qty 3.5

## 2016-01-31 MED ORDER — ALBUTEROL SULFATE (2.5 MG/3ML) 0.083% IN NEBU: 2.5000 mg | INHALATION_SOLUTION | Freq: Every day | RESPIRATORY_TRACT | Status: DC | PRN

## 2016-01-31 MED ORDER — PHENYLEPHRINE HCL 10 MG/ML IJ SOLN
INTRAMUSCULAR | Status: DC | PRN
  Administered 2016-01-31: 20 ug/min via INTRAVENOUS

## 2016-01-31 MED ORDER — LACTATED RINGERS IV SOLN: INTRAVENOUS | Status: DC

## 2016-01-31 MED ORDER — ONDANSETRON HCL 4 MG/2ML IJ SOLN
INTRAMUSCULAR | Status: DC | PRN
  Administered 2016-01-31: 4 mg via INTRAVENOUS

## 2016-01-31 MED ORDER — 0.9 % SODIUM CHLORIDE (POUR BTL) OPTIME
TOPICAL | Status: DC | PRN
  Administered 2016-01-31: 3000 mL

## 2016-01-31 MED ORDER — LACTATED RINGERS IV SOLN
INTRAVENOUS | Status: DC | PRN
  Administered 2016-01-31 (×2): via INTRAVENOUS

## 2016-01-31 MED ORDER — SODIUM CHLORIDE 0.9 % IV BOLUS (SEPSIS)
500.0000 mL | Freq: Once | INTRAVENOUS | Status: AC
  Administered 2016-01-31: 500 mL via INTRAVENOUS

## 2016-01-31 MED ORDER — MORPHINE SULFATE (PF) 2 MG/ML IV SOLN
1.0000 mg | INTRAVENOUS | Status: DC | PRN
  Administered 2016-01-31: 0.5 mg via INTRAVENOUS
  Administered 2016-02-01 (×2): 1 mg via INTRAVENOUS
  Administered 2016-02-03: 2 mg via INTRAVENOUS
  Filled 2016-01-31 (×4): qty 1

## 2016-01-31 MED ORDER — KCL IN DEXTROSE-NACL 20-5-0.45 MEQ/L-%-% IV SOLN
INTRAVENOUS | Status: DC
  Administered 2016-01-31 – 2016-02-01 (×3): via INTRAVENOUS
  Administered 2016-02-02: 100 mL via INTRAVENOUS
  Administered 2016-02-02 – 2016-02-03 (×2): via INTRAVENOUS
  Filled 2016-01-31 (×7): qty 1000

## 2016-01-31 MED ORDER — DEXAMETHASONE SODIUM PHOSPHATE 10 MG/ML IJ SOLN
INTRAMUSCULAR | Status: AC
  Filled 2016-01-31: qty 1

## 2016-01-31 MED ORDER — PROPOFOL 10 MG/ML IV BOLUS: INTRAVENOUS | Status: DC | PRN

## 2016-01-31 MED ORDER — LABETALOL HCL 5 MG/ML IV SOLN
10.0000 mg | INTRAVENOUS | Status: DC | PRN
  Administered 2016-02-05 – 2016-02-06 (×2): 10 mg via INTRAVENOUS
  Filled 2016-01-31 (×2): qty 4

## 2016-01-31 MED ORDER — DEXAMETHASONE SODIUM PHOSPHATE 10 MG/ML IJ SOLN
INTRAMUSCULAR | Status: DC | PRN
  Administered 2016-01-31: 5 mg via INTRAVENOUS

## 2016-01-31 MED ORDER — FENTANYL CITRATE (PF) 100 MCG/2ML IJ SOLN
INTRAMUSCULAR | Status: DC | PRN
  Administered 2016-01-31: 100 ug via INTRAVENOUS
  Administered 2016-01-31: 50 ug via INTRAVENOUS
  Administered 2016-01-31: 350 ug via INTRAVENOUS

## 2016-01-31 MED ORDER — LIDOCAINE HCL (CARDIAC) 20 MG/ML IV SOLN
INTRAVENOUS | Status: DC | PRN
  Administered 2016-01-31: 100 mg via INTRAVENOUS

## 2016-01-31 MED ORDER — METOPROLOL TARTRATE 5 MG/5ML IV SOLN
2.0000 mg | INTRAVENOUS | Status: DC | PRN
  Filled 2016-01-31: qty 5

## 2016-01-31 MED ORDER — ACETAMINOPHEN 325 MG PO TABS: 325.0000 mg | ORAL_TABLET | ORAL | Status: DC | PRN

## 2016-01-31 MED ORDER — ONDANSETRON HCL 4 MG/2ML IJ SOLN
4.0000 mg | Freq: Four times a day (QID) | INTRAMUSCULAR | Status: DC | PRN
  Administered 2016-01-31 – 2016-02-01 (×2): 4 mg via INTRAVENOUS
  Filled 2016-01-31 (×2): qty 2

## 2016-01-31 MED ORDER — CHLORHEXIDINE GLUCONATE 0.12 % MT SOLN
15.0000 mL | Freq: Two times a day (BID) | OROMUCOSAL | Status: DC
  Administered 2016-01-31: 15 mL via OROMUCOSAL

## 2016-01-31 MED ORDER — MIDAZOLAM HCL 5 MG/5ML IJ SOLN
INTRAMUSCULAR | Status: DC | PRN
  Administered 2016-01-31: 1 mg via INTRAVENOUS

## 2016-01-31 MED ORDER — CHLORHEXIDINE GLUCONATE 0.12 % MT SOLN
15.0000 mL | Freq: Two times a day (BID) | OROMUCOSAL | Status: DC
  Administered 2016-01-31 – 2016-02-06 (×12): 15 mL via OROMUCOSAL
  Filled 2016-01-31 (×3): qty 15

## 2016-01-31 SURGICAL SUPPLY — 50 items
BARD PTFE FELT ×2 IMPLANT
BENZOIN TINCTURE PRP APPL 2/3 (GAUZE/BANDAGES/DRESSINGS) ×2 IMPLANT
CANISTER SUCTION 2500CC (MISCELLANEOUS) ×2 IMPLANT
CANNULA VESSEL 3MM 2 BLNT TIP (CANNULA) ×2 IMPLANT
CLIP LIGATING EXTRA MED SLVR (CLIP) ×4 IMPLANT
CLIP LIGATING EXTRA SM BLUE (MISCELLANEOUS) ×2 IMPLANT
CLSR STERI-STRIP ANTIMIC 1/2X4 (GAUZE/BANDAGES/DRESSINGS) ×4 IMPLANT
DRSG COVADERM 4X14 (GAUZE/BANDAGES/DRESSINGS) ×2 IMPLANT
ELECT BLADE 4.0 EZ CLEAN MEGAD (MISCELLANEOUS) ×2
ELECT REM PT RETURN 9FT ADLT (ELECTROSURGICAL) ×2
ELECTRODE BLDE 4.0 EZ CLN MEGD (MISCELLANEOUS) ×1 IMPLANT
ELECTRODE REM PT RTRN 9FT ADLT (ELECTROSURGICAL) ×1 IMPLANT
FELT TEFLON 1X6 (Vascular Products) ×2 IMPLANT
GLOVE BIOGEL PI IND STRL 6.5 (GLOVE) ×2 IMPLANT
GLOVE BIOGEL PI IND STRL 7.0 (GLOVE) ×2 IMPLANT
GLOVE BIOGEL PI INDICATOR 6.5 (GLOVE) ×2
GLOVE BIOGEL PI INDICATOR 7.0 (GLOVE) ×2
GLOVE SS BIOGEL STRL SZ 7.5 (GLOVE) IMPLANT
GLOVE SUPERSENSE BIOGEL SZ 7.5 (GLOVE)
GLOVE SURG SS PI 6.5 STRL IVOR (GLOVE) ×4 IMPLANT
GLOVE SURG SS PI 7.0 STRL IVOR (GLOVE) ×4 IMPLANT
GLOVE SURG SS PI 7.5 STRL IVOR (GLOVE) ×2 IMPLANT
GOWN STRL REUS W/ TWL LRG LVL3 (GOWN DISPOSABLE) ×4 IMPLANT
GOWN STRL REUS W/ TWL XL LVL3 (GOWN DISPOSABLE) ×2 IMPLANT
GOWN STRL REUS W/TWL LRG LVL3 (GOWN DISPOSABLE) ×4
GOWN STRL REUS W/TWL XL LVL3 (GOWN DISPOSABLE) ×2
GRAFT HEMASHIELD 12MM (Vascular Products) ×1 IMPLANT
GRAFT VASC STRG 30X12KNIT (Vascular Products) ×1 IMPLANT
INSERT FOGARTY 61MM (MISCELLANEOUS) ×4 IMPLANT
INSERT FOGARTY SM (MISCELLANEOUS) ×8 IMPLANT
KIT BASIN OR (CUSTOM PROCEDURE TRAY) ×2 IMPLANT
KIT ROOM TURNOVER OR (KITS) ×2 IMPLANT
NS IRRIG 1000ML POUR BTL (IV SOLUTION) ×4 IMPLANT
PACK AORTA (CUSTOM PROCEDURE TRAY) ×2 IMPLANT
PAD ARMBOARD 7.5X6 YLW CONV (MISCELLANEOUS) ×4 IMPLANT
SPONGE LAP 18X18 X RAY DECT (DISPOSABLE) IMPLANT
STAPLER VISISTAT 35W (STAPLE) IMPLANT
SUT ETHIBOND 5 LR DA (SUTURE) IMPLANT
SUT PDS AB 1 TP1 54 (SUTURE) ×4 IMPLANT
SUT PROLENE 3 0 SH1 36 (SUTURE) ×4 IMPLANT
SUT PROLENE 5 0 C 1 24 (SUTURE) ×2 IMPLANT
SUT PROLENE 5 0 C 1 36 (SUTURE) IMPLANT
SUT SILK 2 0 SH CR/8 (SUTURE) ×2 IMPLANT
SUT VIC AB 2-0 CT1 36 (SUTURE) ×6 IMPLANT
SUT VIC AB 3-0 SH 27 (SUTURE) ×2
SUT VIC AB 3-0 SH 27X BRD (SUTURE) ×2 IMPLANT
TOWEL BLUE STERILE X RAY DET (MISCELLANEOUS) ×4 IMPLANT
TRAY FOLEY CATH SILVER 16FR LF (SET/KITS/TRAYS/PACK) ×2 IMPLANT
TRAY FOLEY W/METER SILVER 16FR (SET/KITS/TRAYS/PACK) IMPLANT
WATER STERILE IRR 1000ML POUR (IV SOLUTION) ×4 IMPLANT

## 2016-01-31 NOTE — H&P (View-Only) (Signed)
   Patient name: Ruth [name] MRN: [id number] DOB: Oct 09, 1932 Sex: female  REASON FOR VISIT: Discussion of upcoming open aneurysm repair  HPI: TONY SCHURIG is a 82 y.o. female here today for discussion of her upcoming aneurysm repair. She had suffered a TIA prior to her scheduled repair. This was postponed and she has had the full neurology workup has been cleared for surgery. She is here today with her husband. She is completely back to her baseline  Current Outpatient Prescriptions  Medication Sig Dispense Refill  . Albuterol Sulfate (PROAIR RESPICLICK) [id number] (90 Base) MCG/ACT AEPB Inhale 2 puffs into the lungs daily as needed (shortness of breath).    Marland Kitchen alendronate (FOSAMAX) 70 MG tablet Take 70 mg by mouth once a week. Take with a full glass of water on an empty stomach. On Monday    . calcium carbonate (TUMS EX) 750 MG chewable tablet Chew 1 tablet by mouth daily.     . cholecalciferol (VITAMIN D) 1000 UNITS tablet Take 1 tablet (1,000 Units total) by mouth daily. 90 tablet 4  . clopidogrel (PLAVIX) 75 MG tablet Take 1 tablet (75 mg total) by mouth daily. 30 tablet 11  . fish oil-omega-3 fatty acids 1000 MG capsule Take 1 g by mouth daily.     Marland Kitchen ibuprofen (ADVIL,MOTRIN) 200 MG tablet Take 200 mg by mouth every 6 (six) hours as needed for moderate pain.    . pravastatin (PRAVACHOL) 10 MG tablet Take 10 mg by mouth daily.    Marland Kitchen tiotropium (SPIRIVA) 18 MCG inhalation capsule Place 18 mcg into inhaler and inhale daily.    . tamoxifen (NOLVADEX) 20 MG tablet Take 1 tablet (20 mg total) by mouth daily. 90 tablet 3   No current facility-administered medications for this visit.      PHYSICAL EXAM: Vitals:   01/20/16 0946 01/20/16 0949  BP: (!) 144/92 (!) 145/91  Pulse: 100   Resp: 14   SpO2: 95%   Weight: 91 lb (41.3 kg)   Height: 5' (1.524 m)     GENERAL: The patient is a well-nourished female, in no acute distress. The vital signs are documented  above. Abdomen soft easily palpable aneurysm which is nontender  MEDICAL ISSUES: Again discussed the upcoming plan for surgery. She is at increased risk due to her COPD. Do not have other options for treatment and they do not want to consider observation only. Again discussed the procedure itself and the postoperative expectation with intensive care unit in hopes Ruth [name] extubation. Also 5 to six-day expected hospitalization. Ready to proceed as scheduled   Rosetta Posner, MD [facility] Tel [phone] Pager [phone]

## 2016-01-31 NOTE — Anesthesia Preprocedure Evaluation (Addendum)
Anesthesia Evaluation  Patient identified by MRN, date of birth, ID band Patient awake    Reviewed: Allergy & Precautions, NPO status , Patient's Chart, lab work & pertinent test results  Airway Mallampati: I  TM Distance: >3 FB Neck ROM: Full    Dental   Pulmonary COPD, Current Smoker,    Pulmonary exam normal        Cardiovascular Normal cardiovascular exam  ECHO 11/2015 Study Conclusions  - Left ventricle: The cavity size was normal. Wall thickness was   normal. Systolic function was normal. The estimated ejection   fraction was in the range of 60% to 65%. Wall motion was normal;   there were no regional wall motion abnormalities. Doppler   parameters are consistent with abnormal left ventricular   relaxation (grade 1 diastolic dysfunction). - Ventricular septum: Mildly D-shaped interventricular septum,   suggesting a degree of RV pressure/volume overload. - Aortic valve: There was no stenosis. - Mitral valve: There was no significant regurgitation. - Right ventricle: The cavity size was mildly dilated. Systolic   function was normal. - Right atrium: The atrium was mildly dilated. - Tricuspid valve: Peak RV-RA gradient (S): 47 mm Hg. - Pulmonary arteries: PA peak pressure: 50 mm Hg (S). - Inferior vena cava: The vessel was normal in size. The   respirophasic diameter changes were in the normal range (>= 50%),   consistent with normal central venous pressure.  Impressions:  - Normal LV size and systolic function, EF [id number]. Mildly dilated   RV with normal systolic function. D-shaped interventricular   septum, suggesting RV pressure/volume overload. Moderate   pulmonary hypertension.   Neuro/Psych    GI/Hepatic   Endo/Other    Renal/GU      Musculoskeletal   Abdominal   Peds  Hematology   Anesthesia Other Findings   Reproductive/Obstetrics                            Anesthesia  Physical Anesthesia Plan  ASA: III  Anesthesia Plan: General   Post-op Pain Management:    Induction: Intravenous  Airway Management Planned: Oral ETT  Additional Equipment: Arterial line, CVP, PA Cath and Ultrasound Guidance Line Placement  Intra-op Plan:   Post-operative Plan: Possible Post-op intubation/ventilation  Informed Consent: I have reviewed the patients History and Physical, chart, labs and discussed the procedure including the risks, benefits and alternatives for the proposed anesthesia with the patient or authorized representative who has indicated his/her understanding and acceptance.     Plan Discussed with: CRNA and Surgeon  Anesthesia Plan Comments:         Anesthesia Quick Evaluation

## 2016-01-31 NOTE — Progress Notes (Signed)
Subjective: Interval History: none.. Sedated in surgical intensive care unit. Arousable. Urine output 50 cc/h  Objective: Vital signs in last 24 hours: Temp:  [95.9 F (35.5 C)-97.2 F (36.2 C)] 95.9 F (35.5 C) (08/07 1523) Pulse Rate:  [68-76] 71 (08/07 1523) Resp:  [13-19] 16 (08/07 1511) BP: (123-147)/(59-81) 147/73 (08/07 1523) SpO2:  [90 %-100 %] 97 % (08/07 1523) Arterial Line BP: (125-160)/(54-72) 125/54 (08/07 1315) FiO2 (%):  [70 %] 70 % (08/07 1523) Weight:  [89 lb 15.2 oz (40.8 kg)-90 lb (40.8 kg)] 89 lb 15.2 oz (40.8 kg) (08/07 1522)  Intake/Output from previous day: No intake/output data recorded. Intake/Output this shift: Total I/O In: 2460 [I.V.:2320; NG/GT:40; IV Piggyback:100] Out: 1120 [Urine:900; Emesis/NG output:20; Blood:200]  Abdomen soft. No change in right neck hematoma.  Lab Results:  Recent Labs  02/02/2016 1212 02/07/2016 1300  WBC  --  20.4*  HGB 10.9* 11.2*  HCT 32.0* 35.2*  PLT  --  148*   BMET  Recent Labs  02/18/2016 1212 02/06/2016 1300  NA 140 136  K 3.7 3.8  CL  --  107  CO2  --  25  GLUCOSE  --  201*  BUN  --  15  CREATININE  --  1.09*  CALCIUM  --  7.9*    Studies/Results: Dg Chest Port 1 View  Result Date: 02/22/2016 CLINICAL DATA:  Status post AAA repair EXAM: PORTABLE CHEST 1 VIEW COMPARISON:  06/20/2015 FINDINGS: Cardiac shadow is within normal limits. The lungs are again hyperinflated. Bilateral nipple shadows are again seen. Swan-Ganz catheter is noted in the pulmonary outflow tract. Nasogastric catheter is noted extending into the stomach. No infiltrate or sizable pneumothorax is seen. No bony abnormality is noted. IMPRESSION: Tubes and lines as described. No acute abnormality seen. Electronically Signed   By: Inez Catalina M.D.   On: 02/14/2016 12:48   Dg Abd Portable 1v  Result Date: 02/04/2016 CLINICAL DATA:  Status post aortic aneurysm repair EXAM: PORTABLE ABDOMEN - 1 VIEW COMPARISON:  None. FINDINGS: Postsurgical  changes are seen. A nasogastric catheter is noted deep in the stomach. Degenerative changes of the lumbar spine are seen with evidence of stable L1 compression deformity. No soft tissue abnormality is seen. IMPRESSION: Postsurgical changes.  No acute abnormality noted. Electronically Signed   By: Inez Catalina M.D.   On: 02/23/2016 12:47   Anti-infectives: Anti-infectives    Start     Dose/Rate Route Frequency Ordered Stop   02/20/2016 2000  cefUROXime (ZINACEF) 1.5 g in dextrose 5 % 50 mL IVPB     1.5 g 100 mL/hr over 30 Minutes Intravenous Every 12 hours 01/26/2016 1325 02/01/16 1959   02/16/2016 0603  cefUROXime (ZINACEF) 1.5 g in dextrose 5 % 50 mL IVPB     1.5 g 100 mL/hr over 30 Minutes Intravenous 30 min pre-op 02/05/2016 0603 01/30/2016 0811      Assessment/Plan: s/p Procedure(s): ABDOMINAL AORTIC ANEURYSM REPAIR (N/A) Stable overall. Continue to work on pulmonary effort. PH has improved on most recent gas.   LOS: 0 days   Curt Jews 01/25/2016, 3:29 PM

## 2016-01-31 NOTE — Progress Notes (Addendum)
2300: Dr. Donnetta Hutching paged about urine output of 10 mL/h x 2 hours. Received call back from [place]. Verbal orders to give 500 mL NS bolus over 2 hours. Orders enacted, will continue to monitor.  0200: Urine output minimally improved with only 35 mL of urine out the past 3 hours. Dr. Donnetta Hutching made aware and order to repeat 500 mL NS bolus over 2 hours received. Will continue to monitor.

## 2016-01-31 NOTE — Plan of Care (Signed)
Dr. Donnetta Hutching in to see pt, made aware of CI, UO and blood sugar. Aware of pt's lethargy

## 2016-01-31 NOTE — Transfer of Care (Signed)
Immediate Anesthesia Transfer of Care Note  Patient: Ruth [name]  Procedure(s) Performed: Procedure(s): ABDOMINAL AORTIC ANEURYSM REPAIR (N/A)  Patient Location: PACU  Anesthesia Type:General  Level of Consciousness: patient cooperative and responds to stimulation  Airway & Oxygen Therapy: Patient Spontanous Breathing and Patient connected to nasal cannula oxygen  Post-op Assessment: Report given to RN and Post -op Vital signs reviewed and stable  Post vital signs: Reviewed and stable  Last Vitals:  Vitals:   01/27/2016 1135  BP: (!) 123/59  Pulse: 72  Resp: 18  Temp: 36.2 C    Last Pain: There were no vitals filed for this visit.       Complications: No apparent anesthesia complications

## 2016-01-31 NOTE — Op Note (Signed)
OPERATIVE REPORT  DATE OF SURGERY: 01/30/2016  PATIENT: Ruth [name], 82 y.o. female MRN: [id number]  DOB: [birth year]  PRE-OPERATIVE DIAGNOSIS: Abdominal aortic aneurysm  POST-OPERATIVE DIAGNOSIS:  Same  PROCEDURE: Resection and grafting abdominal aortic aneurysm with a straight 21mm Hemashield graft  SURGEON:  Curt Jews, M.D.  PHYSICIAN ASSISTANT: Silva Bandy PA-C  ANESTHESIA:  Gen.  EBL: 200 ml  Total I/O In: 2000 [I.V.:2000] Out: 850 [Urine:650; Blood:200]  BLOOD ADMINISTERED: None  DRAINS: NG  SPECIMEN: None  COUNTS CORRECT:  YES  PLAN OF CARE: PACU extubated   PATIENT DISPOSITION:  PACU - hemodynamically stable  PROCEDURE DETAILS: Patient was taken to the operative placed supine position where the area of the abdomen both groins were prepped and draped in usual sterile fashion. Incision was made from the level of the xiphoid to below the umbilicus and carried down through the midline fascia with electrocautery. The peritoneum was entered. There was some adhesions of small bowel to the anterior abdominal wall and these were taken down for mobilization. The stomach was normal and NG tube was in good position.: Was normal. There was some firm stool in the colon with some diverticular disease. The omni-Trac retractor was used for exposure. The transverse colon omentum were reflected superiorly and the small bowel was reflected to the right. The duodenum was mobilized off the aorta. The inferior mesenteric vein was ligated and divided for continued exposure. The left renal vein was mobilized to give access to the aorta at the level of the renal arteries. The patient did have a large saccular projection as seen by CT to the left below the level renal arteries. The artery became normal caliber at the aortic bifurcation with normal caliber common iliac arteries bilaterally. The common iliac arteries were encircled with vessel loops bilaterally and were adequate for clamping and  for potential sewing. Inferior mesenteric artery was ligated at its origin and divided. The aorta was completely encircled below the level the renal arteries. The patient was given 5000 units intravenous heparin and after adequate circulation time the aorta was occluded below the level the renal arteries with a Hartmann clamp. The common iliac arteries were occluded bilaterally with baby Gregory clamps. The aorta was opened longitudinally with electrocautery. Mural thrombus was removed. There were several proximal lumbar bleeders and these were controlled with 2-0 silk figure-of-eight sutures. The aorta was transected below the level of the proximal clamp. The distal aorta was again visualized. The aorta became normal caliber at the bifurcation. The aorta was transected at this level for end and anastomosis. A 12 Hemashield graft was brought onto the field and using a felt strip reinforcement the graft was sewn into into the aorta below the level the renal arteries. Was with a running 3-0 Prolene suture. This anastomosis was tested and found to be adequate. Next the graft was cut to appropriate length and the graft was sewn into into the distal aorta at the bifurcation again using a felt strip reinforcement. Prior to completion of the anastomosis the usual flushing maneuvers were undertaken. The anastomosis was completed and flow was short first the right leg and then once pressure stabilized to the left leg. The patient was given 50 mg of protamine to reverse the heparin. Wounds were irrigated with saline. Hemostasis tablet cautery. The aortic wall was closed over the graft with a running 2-0 Vicryl suture. The retroperitoneum was then closed with a running 2-0 Vicryl suture. The small bowel was run its entirety and  found to be without injury. This was placed back in the pelvis. The transverse colon omentum replaced over this. The midline fascia was closed with a #1 PDS suture beginning proximally and distally and  tying in the middle. The wound again was irrigated with saline and hemostasis was obtained left cautery. This skin was closed with 3-0 subcuticular Vicryl suture beginning proximally and distally and tying in the middle. Dressing was applied. The patient has 2+ dorsalis pedis pulses bilaterally. She was transferred to the recovery room extubated in stable condition   Curt Jews, M.D. 02/16/2016 12:31 PM

## 2016-01-31 NOTE — Progress Notes (Signed)
Dr. Conrad [place] and Dr. Donnetta Hutching at bedside to assess patient.  Dr. Donnetta Hutching aware of Right neck hematoma and stated it is better.

## 2016-01-31 NOTE — Interval H&P Note (Signed)
History and Physical Interval Note:  02/16/2016 7:02 AM  Ruth [name]  has presented today for surgery, with the diagnosis of Abdominal aortic aneurysm I71.4  The various methods of treatment have been discussed with the patient and family. After consideration of risks, benefits and other options for treatment, the patient has consented to  Procedure(s): ANEURYSM ABDOMINAL AORTIC REPAIR (N/A) as a surgical intervention .  The patient's history has been reviewed, patient examined, no change in status, stable for surgery.  I have reviewed the patient's chart and labs.  Questions were answered to the patient's satisfaction.     Curt Jews

## 2016-01-31 NOTE — Anesthesia Postprocedure Evaluation (Signed)
Anesthesia Post Note  Patient: Ruth [name]  Procedure(s) Performed: Procedure(s) (LRB): ABDOMINAL AORTIC ANEURYSM REPAIR (N/A)  Patient location during evaluation: PACU Anesthesia Type: General Level of consciousness: awake and alert Pain management: pain level controlled Vital Signs Assessment: post-procedure vital signs reviewed and stable Respiratory status: spontaneous breathing, nonlabored ventilation, respiratory function stable and patient connected to nasal cannula oxygen Cardiovascular status: blood pressure returned to baseline and stable Postop Assessment: no signs of nausea or vomiting Anesthetic complications: no    Last Vitals:  Vitals:   02/03/2016 1600 02/21/2016 1615  BP: 117/84   Pulse: 86 87  Resp: 17 17  Temp: (!) 35.8 C (!) 35.8 C    Last Pain:  Vitals:   02/21/2016 1600  TempSrc: Core (Comment)                 Quention Mcneill DAVID

## 2016-01-31 NOTE — Anesthesia Procedure Notes (Signed)
Procedure Name: Intubation Date/Time: 02/13/2016 7:55 AM Performed by: [place] Pink Pre-anesthesia Checklist: Patient identified, Emergency Drugs available, Suction available and Patient being monitored Patient Re-evaluated:Patient Re-evaluated prior to inductionOxygen Delivery Method: Circle System Utilized Preoxygenation: Pre-oxygenation with 100% oxygen Intubation Type: IV induction Ventilation: Mask ventilation without difficulty Laryngoscope Size: Mac and 3 Grade View: Grade I Tube type: Subglottic suction tube Tube size: 7.0 mm Number of attempts: 1 Airway Equipment and Method: Stylet Placement Confirmation: ETT inserted through vocal cords under direct vision,  positive ETCO2 and breath sounds checked- equal and bilateral Secured at: 21 cm Tube secured with: Tape Dental Injury: Teeth and Oropharynx as per pre-operative assessment

## 2016-02-01 ENCOUNTER — Inpatient Hospital Stay (HOSPITAL_COMMUNITY): Payer: Medicare Other

## 2016-02-01 ENCOUNTER — Encounter (HOSPITAL_COMMUNITY): Payer: Self-pay | Admitting: Vascular Surgery

## 2016-02-01 LAB — BLOOD GAS, ARTERIAL
ACID-BASE DEFICIT: 3.4 mmol/L — AB (ref 0.0–2.0)
BICARBONATE: 21.7 meq/L (ref 20.0–24.0)
FIO2: 0.28
O2 SAT: 98.8 %
PO2 ART: 123 mmHg — AB (ref 80.0–100.0)
Patient temperature: 97.9
TCO2: 23 mmol/L (ref 0–100)
pCO2 arterial: 42.3 mmHg (ref 35.0–45.0)
pH, Arterial: 7.328 — ABNORMAL LOW (ref 7.350–7.450)

## 2016-02-01 LAB — COMPREHENSIVE METABOLIC PANEL
ALBUMIN: 2.4 g/dL — AB (ref 3.5–5.0)
ALT: 22 U/L (ref 14–54)
ANION GAP: 4 — AB (ref 5–15)
AST: 35 U/L (ref 15–41)
Alkaline Phosphatase: 83 U/L (ref 38–126)
BILIRUBIN TOTAL: 0.6 mg/dL (ref 0.3–1.2)
BUN: 19 mg/dL (ref 6–20)
CO2: 22 mmol/L (ref 22–32)
Calcium: 7.6 mg/dL — ABNORMAL LOW (ref 8.9–10.3)
Chloride: 113 mmol/L — ABNORMAL HIGH (ref 101–111)
Creatinine, Ser: 1.4 mg/dL — ABNORMAL HIGH (ref 0.44–1.00)
GFR calc Af Amer: 40 mL/min — ABNORMAL LOW (ref >60)
GFR, EST NON AFRICAN AMERICAN: 34 mL/min — AB (ref >60)
Glucose, Bld: 76 mg/dL (ref 65–99)
POTASSIUM: 4.5 mmol/L (ref 3.5–5.1)
Sodium: 139 mmol/L (ref 135–145)
TOTAL PROTEIN: 4.4 g/dL — AB (ref 6.5–8.1)

## 2016-02-01 LAB — GLUCOSE, CAPILLARY
Glucose-Capillary: 82 mg/dL (ref 65–99)
Glucose-Capillary: 104 mg/dL — ABNORMAL HIGH (ref 65–99)
Glucose-Capillary: 77 mg/dL (ref 65–99)
Glucose-Capillary: 146 mg/dL — ABNORMAL HIGH (ref 65–99)
Glucose-Capillary: 158 mg/dL — ABNORMAL HIGH (ref 65–99)
Glucose-Capillary: 131 mg/dL — ABNORMAL HIGH (ref 65–99)

## 2016-02-01 LAB — CBC
HEMATOCRIT: 32.4 % — AB (ref 36.0–46.0)
Hemoglobin: 10.5 g/dL — ABNORMAL LOW (ref 12.0–15.0)
MCH: 31.5 pg (ref 26.0–34.0)
MCHC: 32.4 g/dL (ref 30.0–36.0)
MCV: 97.3 fL (ref 78.0–100.0)
Platelets: 111 10*3/uL — ABNORMAL LOW (ref 150–400)
RBC: 3.33 MIL/uL — ABNORMAL LOW (ref 3.87–5.11)
RDW: 13.2 % (ref 11.5–15.5)
WBC: 21.7 10*3/uL — ABNORMAL HIGH (ref 4.0–10.5)

## 2016-02-01 LAB — AMYLASE: Amylase: 107 U/L — ABNORMAL HIGH (ref 28–100)

## 2016-02-01 LAB — MAGNESIUM: MAGNESIUM: 2.4 mg/dL (ref 1.7–2.4)

## 2016-02-01 MED ORDER — HALOPERIDOL LACTATE 5 MG/ML IJ SOLN
2.5000 mg | Freq: Once | INTRAMUSCULAR | Status: AC
  Administered 2016-02-01: 2.5 mg via INTRAVENOUS
  Filled 2016-02-01: qty 1

## 2016-02-01 MED ORDER — SODIUM CHLORIDE 0.9 % IV BOLUS (SEPSIS)
500.0000 mL | Freq: Once | INTRAVENOUS | Status: AC
  Administered 2016-02-01: 500 mL via INTRAVENOUS

## 2016-02-01 MED ORDER — HALOPERIDOL LACTATE 5 MG/ML IJ SOLN
2.5000 mg | Freq: Four times a day (QID) | INTRAMUSCULAR | Status: DC | PRN
  Administered 2016-02-02: 2.5 mg via INTRAVENOUS
  Filled 2016-02-01: qty 1

## 2016-02-01 MED FILL — Sodium Chloride IV Soln 0.9%: INTRAVENOUS | Qty: 3000 | Status: AC

## 2016-02-01 MED FILL — Heparin Sodium (Porcine) Inj 1000 Unit/ML: INTRAMUSCULAR | Qty: 30 | Status: AC

## 2016-02-01 NOTE — Progress Notes (Addendum)
Patient suddenly very agitated. Pulled out PIV and arterial line. Mittens placed, yet patient still pulling off mittens and tugging at ECG wires and introducer in IJ. Dr. Oneida Alar paged and orders received for restraints and 2.5 mg Haldol once and to update him in one hour. Medication given, restraints applied. Patient's husband Tsuyako Schnackenberg updated about patient condition and in agreeance with plan of care. Patient resting and easily arrousable and much calmer, but still trying to pick at lines and mittens. Dr. Oneida Alar updated, orders received for Haldol 2.5 mg q6 PRN if needed. Will continue to monitor patient.

## 2016-02-01 NOTE — Progress Notes (Signed)
Subjective: Interval History: Groggy this morning. Easily arousable. Reports abdominal soreness but no other complaints. Objective: Vital signs in last 24 hours: Temp:  [95.9 F (35.5 C)-97.7 F (36.5 C)] 97.4 F (36.3 C) (08/08 0700) Pulse Rate:  [68-114] 103 (08/08 0800) Resp:  [13-28] 17 (08/08 0800) BP: (108-157)/(59-104) 117/104 (08/08 0800) SpO2:  [90 %-100 %] 98 % (08/08 0800) Arterial Line BP: (125-160)/(54-72) 125/54 (08/07 1315) Weight:  [89 lb 15.2 oz (40.8 kg)-91 lb 11.4 oz (41.6 kg)] 91 lb 11.4 oz (41.6 kg) (08/08 0400)  Intake/Output from previous day: 08/07 0701 - 08/08 0700 In: 5300 [I.V.:3980; NG/GT:160; IV Piggyback:1150] Out: 1760 [Urine:1290; Emesis/NG output:270; Blood:200] Intake/Output this shift: Total I/O In: 150 [I.V.:100; IV Piggyback:50] Out: 130 [Urine:30; Emesis/NG output:100]  Abdomen soft mild tenderness. 2+ dorsalis pedis pulses bilaterally  Lab Results:  Recent Labs  01/27/2016 1300 02/01/16 0337  WBC 20.4* 21.7*  HGB 11.2* 10.5*  HCT 35.2* 32.4*  PLT 148* 111*   BMET  Recent Labs  02/03/2016 1300 02/01/16 0337  NA 136 139  K 3.8 4.5  CL 107 113*  CO2 25 22  GLUCOSE 201* 76  BUN 15 19  CREATININE 1.09* 1.40*  CALCIUM 7.9* 7.6*    Studies/Results: Dg Chest Port 1 View  Result Date: 02/01/2016 CLINICAL DATA:  AAA repair EXAM: PORTABLE CHEST 1 VIEW COMPARISON:  02/13/2016 FINDINGS: Swan-Ganz catheter removed. Sheath remains in the proximal SVC. NG tube remains in the stomach. Lungs remain clear without infiltrate or effusion. Negative for heart failure. IMPRESSION: Swan-Ganz catheter removed.  Lungs remain clear. Electronically Signed   By: Franchot Gallo M.D.   On: 02/01/2016 07:46   Dg Chest Port 1 View  Result Date: 02/09/2016 CLINICAL DATA:  Status post AAA repair EXAM: PORTABLE CHEST 1 VIEW COMPARISON:  06/20/2015 FINDINGS: Cardiac shadow is within normal limits. The lungs are again hyperinflated. Bilateral nipple shadows are  again seen. Swan-Ganz catheter is noted in the pulmonary outflow tract. Nasogastric catheter is noted extending into the stomach. No infiltrate or sizable pneumothorax is seen. No bony abnormality is noted. IMPRESSION: Tubes and lines as described. No acute abnormality seen. Electronically Signed   By: Inez Catalina M.D.   On: 01/28/2016 12:48   Dg Abd Portable 1v  Result Date: 01/26/2016 CLINICAL DATA:  Status post aortic aneurysm repair EXAM: PORTABLE ABDOMEN - 1 VIEW COMPARISON:  None. FINDINGS: Postsurgical changes are seen. A nasogastric catheter is noted deep in the stomach. Degenerative changes of the lumbar spine are seen with evidence of stable L1 compression deformity. No soft tissue abnormality is seen. IMPRESSION: Postsurgical changes.  No acute abnormality noted. Electronically Signed   By: Inez Catalina M.D.   On: 01/30/2016 12:47   Anti-infectives: Anti-infectives    Start     Dose/Rate Route Frequency Ordered Stop   02/20/2016 2000  cefUROXime (ZINACEF) 1.5 g in dextrose 5 % 50 mL IVPB     1.5 g 100 mL/hr over 30 Minutes Intravenous Every 12 hours 02/02/2016 1325 02/01/16 0807   02/09/2016 0603  cefUROXime (ZINACEF) 1.5 g in dextrose 5 % 50 mL IVPB     1.5 g 100 mL/hr over 30 Minutes Intravenous 30 min pre-op 02/15/2016 0603 01/30/2016 0811      Assessment/Plan: s/p Procedure(s): ABDOMINAL AORTIC ANEURYSM REPAIR (N/A) Stable postop day 1. Urine output is been down. Has received several boluses. Chest x-ray shows no evidence of congestive failure. May need additional volume. Minimal NG output. Will DC MG. (Mobilize.  LOS: 1 day   Curt Jews 02/01/2016, 8:50 AM

## 2016-02-01 NOTE — Plan of Care (Signed)
Problem: Activity: Goal: Ability to tolerate increased activity will improve Outcome: Not Progressing When attempting to get patient to chair from bed patient became dizzy and hypotensive, question vagal response vs. orthostatic hypotension. Pt returned to bed, mentation and all vitals except blood pressure returned to pre-activity baseline. Blood pressure marginally low 90s/60s. PRN order for 500 mL bolus for SBP <100 given. BP improved to 120s/80s.   Problem: Respiratory: Goal: Levels of oxygenation will improve Outcome: Progressing Pt decreased to 2 liters [place] over night and tolerating well, morning ABG good. Needs a lot of reinforcement with incentive spirometer. Pulling 500 on the IS with lots of verbal cues.

## 2016-02-01 NOTE — Progress Notes (Signed)
OT Cancellation Note  Patient Details Name: Ruth [name] MRN: [id number] DOB: 08/23/1932   Cancelled Treatment:    Reason Eval/Treat Not Completed: Other (comment).  Spoke to [employer]. Pt is not ready to be seen by OT yet.  She is lethargic and BP is dropping whenever she moves to EOB.  Will check back tomorrow.  Lori Liew 02/01/2016, 1:11 PM  Lesle Chris, OTR/L [phone] 02/01/2016

## 2016-02-01 NOTE — Plan of Care (Signed)
Patient drops Bp down to 60's when dangled or moved to chair. Patient still lethargic but will arouse to name and follow commands. Patient confused as to situation and place.Not steady on feet.

## 2016-02-01 NOTE — Evaluation (Signed)
Physical Therapy Evaluation Patient Details Name: Ruth [name] MRN: [id number] DOB: 10/22/1932 Today's Date: 02/01/2016   History of Present Illness  Pt underwent AAA repair on 02/09/2016. PMH - COPD, Lt CVA, Cdiff, L1 compression fx, osteoporosis.  Clinical Impression  Pt admitted with above diagnosis and presents to PT with functional limitations due to deficits listed below (See PT problem list). Pt needs skilled PT to maximize independence and safety to allow discharge to ST-SNF prior to return to ALF.  Expect will make better progress as recovers from anesthesia.     Follow Up Recommendations SNF    Equipment Recommendations  Other (comment) (To be assessed)    Recommendations for Other Services       Precautions / Restrictions Precautions Precautions: Fall;Other (comment) Precaution Comments: watch BP Restrictions Weight Bearing Restrictions: No      Mobility  Bed Mobility Overal bed mobility: Needs Assistance Bed Mobility: Supine to Sit;Sit to Supine     Supine to sit: +2 for physical assistance;Total assist Sit to supine: +2 for physical assistance;Total assist   General bed mobility comments: Assist with all aspects.   Transfers                    Ambulation/Gait                Stairs            Wheelchair Mobility    Modified Rankin (Stroke Patients Only)       Balance Overall balance assessment: Needs assistance Sitting-balance support: Bilateral upper extremity supported;Feet supported Sitting balance-Leahy Scale: Zero Sitting balance - Comments: Pt sat EOB x 3 minutes with pt initially attempting to lie back down. Pt anxious. Pt then with incr lethargy with BP 90's/70's. Returned to supine.                                     Pertinent Vitals/Pain Pain Assessment: Faces Faces Pain Scale: Hurts whole lot Pain Location: incision Pain Descriptors / Indicators: Grimacing;Operative site guarding Pain  Intervention(s): Limited activity within patient's tolerance;Monitored during session;Repositioned    Home Living Family/patient expects to be discharged to:: [place]: [place] Rile - 2 wheels;Shower seat - built in Additional Comments: Lived in ALF but will need SNF.     Prior Function Level of Independence: Needs assistance   Gait / Transfers Assistance Needed: amb independently ? use of walker           Hand Dominance   Dominant Hand: Right    Extremity/Trunk Assessment   Upper Extremity Assessment: Defer to OT evaluation           Lower Extremity Assessment: RLE deficits/detail;LLE deficits/detail RLE Deficits / Details: active movement but very weak. Also limited by lethargy LLE Deficits / Details: active movement but very weak. Also limited by lethargy     Communication   Communication: HOH  Cognition Arousal/Alertness: Lethargic Behavior During Therapy: Flat affect;Anxious Overall Cognitive Status: Impaired/Different from baseline Area of Impairment: Attention;Following commands;Problem solving   Current Attention Level: Focused   Following Commands: Follows one step commands inconsistently     Problem Solving: Slow processing;Decreased initiation;Requires verbal cues;Requires tactile cues General Comments: Expect some due to post anesthesia    General Comments      Exercises  Assessment/Plan    PT Assessment Patient needs continued PT services  PT Diagnosis Difficulty walking;Generalized weakness   PT Problem List Decreased strength;Decreased activity tolerance;Decreased balance;Decreased mobility;Cardiopulmonary status limiting activity  PT Treatment Interventions DME instruction;Gait training;Functional mobility training;Therapeutic activities;Therapeutic exercise;Balance training;Patient/family education   PT Goals (Current goals can be found in the Care Plan section) Acute Rehab PT  Goals Patient Stated Goal: Pt unable to state PT Goal Formulation: Patient unable to participate in goal setting Time For Goal Achievement: 02/15/16 Potential to Achieve Goals: Fair    Frequency Min 3X/week   Barriers to discharge        Co-evaluation               End of Session Equipment Utilized During Treatment: Oxygen Activity Tolerance: Patient limited by fatigue;Patient limited by lethargy Patient left: in bed;with call bell/phone within reach;with bed alarm set Nurse Communication: Mobility status         Time: [id number] PT Time Calculation (min) (ACUTE ONLY): 13 min   Charges:   PT Evaluation $PT Eval Moderate Complexity: 1 Procedure     PT G Codes:        Amr Sturtevant February 17, 2016, 4:05 PM [facility] PT [phone]

## 2016-02-02 DIAGNOSIS — I714 Abdominal aortic aneurysm, without rupture: Principal | ICD-10-CM

## 2016-02-02 DIAGNOSIS — Z8679 Personal history of other diseases of the circulatory system: Secondary | ICD-10-CM

## 2016-02-02 DIAGNOSIS — Z9889 Other specified postprocedural states: Secondary | ICD-10-CM

## 2016-02-02 DIAGNOSIS — Z720 Tobacco use: Secondary | ICD-10-CM

## 2016-02-02 LAB — CBC
HEMATOCRIT: 25.8 % — AB (ref 36.0–46.0)
HEMOGLOBIN: 8.5 g/dL — AB (ref 12.0–15.0)
MCH: 32.3 pg (ref 26.0–34.0)
MCHC: 32.9 g/dL (ref 30.0–36.0)
MCV: 98.1 fL (ref 78.0–100.0)
Platelets: 97 10*3/uL — ABNORMAL LOW (ref 150–400)
RBC: 2.63 MIL/uL — ABNORMAL LOW (ref 3.87–5.11)
RDW: 14 % (ref 11.5–15.5)
WBC: 18 10*3/uL — ABNORMAL HIGH (ref 4.0–10.5)

## 2016-02-02 LAB — POCT I-STAT 3, ART BLOOD GAS (G3+)
Acid-base deficit: 7 mmol/L — ABNORMAL HIGH (ref 0.0–2.0)
BICARBONATE: 17.6 meq/L — AB (ref 20.0–24.0)
O2 SAT: 91 %
PCO2 ART: 30.6 mmHg — AB (ref 35.0–45.0)
TCO2: 19 mmol/L (ref 0–100)
pH, Arterial: 7.368 (ref 7.350–7.450)
pO2, Arterial: 62 mmHg — ABNORMAL LOW (ref 80.0–100.0)

## 2016-02-02 LAB — GLUCOSE, CAPILLARY
GLUCOSE-CAPILLARY: 100 mg/dL — AB (ref 65–99)
GLUCOSE-CAPILLARY: 155 mg/dL — AB (ref 65–99)
GLUCOSE-CAPILLARY: 81 mg/dL (ref 65–99)
Glucose-Capillary: 143 mg/dL — ABNORMAL HIGH (ref 65–99)
Glucose-Capillary: 79 mg/dL (ref 65–99)
Glucose-Capillary: 97 mg/dL (ref 65–99)

## 2016-02-02 LAB — PHOSPHORUS: PHOSPHORUS: 2.4 mg/dL — AB (ref 2.5–4.6)

## 2016-02-02 LAB — PROTIME-INR
INR: 1.27
Prothrombin Time: 16 seconds — ABNORMAL HIGH (ref 11.4–15.2)

## 2016-02-02 LAB — BASIC METABOLIC PANEL
Anion gap: 3 — ABNORMAL LOW (ref 5–15)
BUN: 26 mg/dL — ABNORMAL HIGH (ref 6–20)
CHLORIDE: 111 mmol/L (ref 101–111)
CO2: 21 mmol/L — AB (ref 22–32)
CREATININE: 1.52 mg/dL — AB (ref 0.44–1.00)
Calcium: 7.3 mg/dL — ABNORMAL LOW (ref 8.9–10.3)
GFR calc non Af Amer: 31 mL/min — ABNORMAL LOW (ref >60)
GFR, EST AFRICAN AMERICAN: 36 mL/min — AB (ref >60)
Glucose, Bld: 77 mg/dL (ref 65–99)
Potassium: 4.9 mmol/L (ref 3.5–5.1)
Sodium: 135 mmol/L (ref 135–145)

## 2016-02-02 LAB — MAGNESIUM: Magnesium: 2.3 mg/dL (ref 1.7–2.4)

## 2016-02-02 LAB — APTT: aPTT: 47 seconds — ABNORMAL HIGH (ref 24–36)

## 2016-02-02 MED ORDER — SODIUM CHLORIDE 0.9 % IV BOLUS (SEPSIS)
500.0000 mL | Freq: Once | INTRAVENOUS | Status: AC
  Administered 2016-02-02: 500 mL via INTRAVENOUS

## 2016-02-02 MED ORDER — SODIUM CHLORIDE 0.9 % IV BOLUS (SEPSIS)
1000.0000 mL | Freq: Once | INTRAVENOUS | Status: AC
  Administered 2016-02-02: 1000 mL via INTRAVENOUS

## 2016-02-02 NOTE — Consult Note (Addendum)
PULMONARY / CRITICAL CARE MEDICINE   Name: Ruth [name] MRN: [id number] DOB: 01/17/1933    ADMISSION DATE:  02/07/2016 CONSULTATION DATE:  8/9  REFERRING MD:  Early  CHIEF COMPLAINT:  Post AAA repair 8/7 with decreased urine output and rising creatine 8/9.  HISTORY OF PRESENT ILLNESS:   82 yo 1 ppd smoker x 61 years, recent TIA(7/17) without residuals who was scheduled for AAA repair in July but was postponed due to TIA. She had AAA repair 8/7 with cross clamp time of 40 minutes and extubated in OR with out issue. On 8/9 she has minimal urine output, Hypotension with sitting up along with syncope. Due to COPD and continued tobacco abuse, worsening renal failure PCCM asked to evaluate and assist in her care. She has some confusion but is able to answer simple questions. PCCM will continue with fluid challenge(2D with ef 60%) and if she does not respond we may need to place cvl for cvp measurements and possible [place] abd to confirm no obstruction secondary to operative interventions.   PAST MEDICAL HISTORY :  She  has a past medical history of AAA (abdominal aortic aneurysm) ([place]); Breast cancer ([place]); Cancer [facility]); COPD (chronic obstructive pulmonary disease) ([place]); Hearing loss (04/01/2013); Hyperlipidemia (04/01/2013); Osteoporosis, unspecified (04/01/2013); Ovarian cancer ([place]); Skin cancer; Stroke [facility]); and Syncope and collapse.  PAST SURGICAL HISTORY: She  has a past surgical history that includes Abdominal hysterectomy; Cholecystectomy; ovarian cyst removed; Cholecystectomy; Breast lumpectomy; skin cancer removed; and Abdominal aortic aneurysm repair (N/A, 02/05/2016).  Allergies  Allergen Reactions  . Latex Other (See Comments)    Non-specified. Pt stated she had a latex allergy in the past.   . No Known Allergies     No current facility-administered medications on file prior to encounter.    Current Outpatient Prescriptions on File Prior to Encounter  Medication Sig  . Albuterol  Sulfate (PROAIR RESPICLICK) [id number] (90 Base) MCG/ACT AEPB Inhale 2 puffs into the lungs daily as needed (shortness of breath).  Marland Kitchen alendronate (FOSAMAX) 70 MG tablet Take 70 mg by mouth once a week. Take with a full glass of water on an empty stomach. On Monday  . calcium carbonate (TUMS EX) 750 MG chewable tablet Chew 1 tablet by mouth daily.   . cholecalciferol (VITAMIN D) 1000 UNITS tablet Take 1 tablet (1,000 Units total) by mouth daily.  . clopidogrel (PLAVIX) 75 MG tablet Take 1 tablet (75 mg total) by mouth daily.  . pravastatin (PRAVACHOL) 10 MG tablet Take 10 mg by mouth daily.  . tamoxifen (NOLVADEX) 20 MG tablet Take 1 tablet (20 mg total) by mouth daily.  Marland Kitchen tiotropium (SPIRIVA) 18 MCG inhalation capsule Place 18 mcg into inhaler and inhale daily.  . fish oil-omega-3 fatty acids 1000 MG capsule Take 1 g by mouth daily.   Marland Kitchen ibuprofen (ADVIL,MOTRIN) 200 MG tablet Take 200 mg by mouth every 6 (six) hours as needed for moderate pain.    FAMILY HISTORY:  Her indicated that the status of her mother is unknown. She indicated that the status of her father is unknown. She indicated that the status of her sister is unknown. She indicated that the status of her daughter is unknown. She indicated that the status of her son is unknown. She indicated that the status of her neg hx is unknown.    SOCIAL HISTORY: She  reports that she has been smoking Cigarettes.  She started smoking about 64 years ago. She has a 61.00 pack-year smoking history. She  has never used smokeless tobacco. She reports that she drinks alcohol. She reports that she does not use drugs.  REVIEW OF SYSTEMS:   10 point review of system taken, please see HPI for positives and negatives.   SUBJECTIVE:  Mild confusion, follows commands  VITAL SIGNS: BP (!) 138/58   Pulse (!) 111   Temp 98.2 F (36.8 C) (Oral)   Resp 16   Ht 5' (1.524 m)   Wt 91 lb 11.4 oz (41.6 kg)   SpO2 97%   BMI 17.91 kg/m   HEMODYNAMICS:     VENTILATOR SETTINGS:    INTAKE / OUTPUT: I/O last 3 completed shifts: In: [id number] [I.V.:3620; Other:20; NG/GT:120; IV [id number] Out: 1150 [Urine:800; Emesis/NG output:350]  PHYSICAL EXAMINATION: General: Thin HOH WF in no distress Neuro: Follows commands by MAEx4, mild confusion, very HOH. HEENT:  No JVD, No LAN, oral mucosa dry Cardiovascular:  HSR RRR ST 123 Lungs:  CTA Abdomen:  Surgical site intact Musculoskeletal:  intact Skin:  Extensive ecchymosis over chest and abd  LABS:  BMET  Recent Labs Lab 02/23/2016 1212 02/15/2016 1300 02/01/16 0337  NA 140 136 139  K 3.7 3.8 4.5  CL  --  107 113*  CO2  --  25 22  BUN  --  15 19  CREATININE  --  1.09* 1.40*  GLUCOSE  --  201* 76    Electrolytes  Recent Labs Lab 01/25/2016 1300 02/01/16 0337  CALCIUM 7.9* 7.6*  MG 1.6* 2.4    CBC  Recent Labs Lab 01/25/2016 1212 01/29/2016 1300 02/01/16 0337  WBC  --  20.4* 21.7*  HGB 10.9* 11.2* 10.5*  HCT 32.0* 35.2* 32.4*  PLT  --  148* 111*    Coag's  Recent Labs Lab 02/17/2016 1400  APTT 28  INR 1.27    Sepsis Markers No results for input(s): LATICACIDVEN, PROCALCITON, O2SATVEN in the last 168 hours.  ABG  Recent Labs Lab 02/12/2016 1212 02/06/2016 1448 02/01/16 0351  PHART 7.283* 7.349* 7.328*  PCO2ART 48.4* 50.8* 42.3  PO2ART 70.0* 202.0* 123*    Liver Enzymes  Recent Labs Lab 02/01/16 0337  AST 35  ALT 22  ALKPHOS 83  BILITOT 0.6  ALBUMIN 2.4*    Cardiac Enzymes No results for input(s): TROPONINI, PROBNP in the last 168 hours.  Glucose  Recent Labs Lab 02/01/16 0348 02/01/16 0512 02/01/16 0750 02/01/16 1153 02/01/16 1616 02/01/16 1938  GLUCAP 82 104* 77 146* 158* 131*    Imaging No results found.   STUDIES:    CULTURES: none  ANTIBIOTICS: None  SIGNIFICANT EVENTS: 8/7 AAA repair  LINES/TUBES: 8/7 rt I j cordis>> 8/7 ett>>8/7 8/7 aline>>8/8  DISCUSSION: 82 yo 1 ppd smoker x 61 years, recent TIA(7/17)  without residuals who was scheduled for AAA repair in July but was postponed due to TIA. She had AAA repair 8/7 with cross clamp time of 40 minutes and extubated in OR with out issue. On 8/9 she has minimal urine output, Hypotension with sitting up along with syncope. Due to COPD and continued tobacco abuse, worsening renal failure PCCM asked to evaluate and assist in her care. She has some confusion but is able to answer simple questions. PCCM will continue with fluid challenge(2D with ef 60%) and if she does not respond we may need to place cvl for cvp measurements and possible [place] abd to confirm no obstruction secondary to operative interventions.  ASSESSMENT / PLAN:  PULMONARY A: COPD FVC (L) 1.76 2.18 80 *1.50 *  68 *-14 FEV1 (L) *0.97 1.61 *60 *0.82 *50 -15 FEV1/FVC (%) *55 74 *74 *54 *73 -1 P:   O2 as needed  Pulmonary toilet  CARDIOVASCULAR A:  Syncope  Hypertensive hx Orthostatic hypotension AAA repair 8/7 P:  Fluid resuscitation  Consider cvl for cvp guidance  RENAL Lab Results  Component Value Date   CREATININE 1.40 (H) 02/01/2016   CREATININE 1.09 (H) 02/12/2016   CREATININE 1.15 (H) 01/24/2016   CREATININE 1.1 04/20/2015   CREATININE 1.5 (H) 04/14/2014   CREATININE 1.2 (H) 04/01/2013    A:   AKI post AAA 8/7 P:   Continue hydration Consider placed cvl for CVP if not responsive to fluid May need US kidneys if creatine worsens Bladder scan 50 8/9  GASTROINTESTINAL A:   Currently (8-9) NPO post AAA 8/7. P:   Sips / chips and advance diet May need Feeding tube  HEMATOLOGIC  Recent Labs  02/14/2016 1300 02/01/16 0337  HGB 11.2* 10.5*    A:   Recent AAA repair 8/7 P:  Follow hgb  INFECTIOUS A:   Leukocytosis with out fever P:   No abx at this  Monitor for infectious process    ENDOCRINE A:   No acute issue  P:   Follow glucose  NEUROLOGIC A:   Mild confusion post op Hx of TIA earlier in 7/17 P:   RASS goal: 0 Minimize  sedation Follow neuro status closely   FAMILY  - Updates:   - Inter-disciplinary family meet or Palliative Care meeting due by:  8/15  Richardson Landry Minor ACNP Maryanna Shape PCCM Pager [phone] till 3 pm If no answer page [phone] 02/02/2016, 10:56 AM  STAFF NOTE: I, Merrie Roof, MD FACP have personally reviewed patient's available data, including medical history, events of note, physical examination and test results as part of my evaluation. I have discussed with resident/NP and other care providers such as pharmacist, RN and RRT. In addition, I personally evaluated patient and elicited key findings of: becomes alert, she is confused and agitated, normal strength equally all ext, no facial droop, perrl, clinically this is encephalopathy/ delirium related to metabolic stressors / sun downing post op, her EF last done is normal so we can use IV low dose Haldol prn for now when needed, with frequent orientations and correction of CRT etc, she still appears to be dry, will provide continued hydration and follow bmet in am, her exam does not prompt a CT head at this stage, would avoid benzo as able, her renal failure is likely ATN s/p (short) cross clamp time, with haldol would re assess qtc now, with her age would avoid high dose haldol with torsades risk as well, when able if she is a responder to haldol would add rispirdal oral 0.5 bid, with her int lethargy will assess ABG x 1, will follow daily, increase volume, fortunatley her resp status appears to be reasonable, she does have int prominence related to chronic  Smoking, will re assess pcxr in am for edema risk with pos balance  Lavon Paganini. Titus Mould, MD, [place] Pgr: [facility] 02/02/2016 1:51 PM

## 2016-02-02 NOTE — Progress Notes (Signed)
OT Cancellation Note  Patient Details Name: Ruth [name] MRN: [id number] DOB: 01-05-1933   Cancelled Treatment:    Reason Eval/Treat Not Completed: Medical issues which prohibited therapy. Pt's HR at 130 resting in bed, pt continues to be confused/agitated and attempting to pull out lines. Per RN, nursing attempted to sit pt EOB this morning and pt had another syncopal episode. Feel pt is unsafe to mobilize at this time. Will check back tomorrow as able.  Redmond Baseman, OTR/L Pager: [phone] 02/02/2016, 1:52 PM

## 2016-02-02 NOTE — Progress Notes (Signed)
Subjective: Interval History: none.. Became agitated and confused last evening. More sedated with Haldol. He answers questions. Does not know where she is. Still confused. Has soft restraints in place.   Objective: Vital signs in last 24 hours: Temp:  [97.5 F (36.4 C)-98.2 F (36.8 C)] 98.2 F (36.8 C) (08/09 0400) Pulse Rate:  [105-119] 109 (08/09 0700) Resp:  [15-27] 15 (08/09 0700) BP: (90-154)/(52-87) 154/60 (08/09 0700) SpO2:  [90 %-98 %] 95 % (08/09 0806)  Intake/Output from previous day: 08/08 0701 - 08/09 0700 In: 2510 [I.V.:2400; IV Piggyback:100] Out: 640 [Urine:540; Emesis/NG output:100] Intake/Output this shift: Total I/O In: 200 [I.V.:200] Out: 20 [Urine:20]  Abdomen soft. Midline dressing intact. 2+ dorsalis pedis pulses. Bruising in her right neck but no expansion of her hematoma  Lab Results:  Recent Labs  02/11/2016 1300 02/01/16 0337  WBC 20.4* 21.7*  HGB 11.2* 10.5*  HCT 35.2* 32.4*  PLT 148* 111*   BMET  Recent Labs  02/14/2016 1300 02/01/16 0337  NA 136 139  K 3.8 4.5  CL 107 113*  CO2 25 22  GLUCOSE 201* 76  BUN 15 19  CREATININE 1.09* 1.40*  CALCIUM 7.9* 7.6*    Studies/Results: Dg Chest Port 1 View  Result Date: 02/01/2016 CLINICAL DATA:  AAA repair EXAM: PORTABLE CHEST 1 VIEW COMPARISON:  01/30/2016 FINDINGS: Swan-Ganz catheter removed. Sheath remains in the proximal SVC. NG tube remains in the stomach. Lungs remain clear without infiltrate or effusion. Negative for heart failure. IMPRESSION: Swan-Ganz catheter removed.  Lungs remain clear. Electronically Signed   By: Ruth [name] M.D.   On: 02/01/2016 07:46   Dg Chest Port 1 View  Result Date: 01/29/2016 CLINICAL DATA:  Status post AAA repair EXAM: PORTABLE CHEST 1 VIEW COMPARISON:  06/20/2015 FINDINGS: Cardiac shadow is within normal limits. The lungs are again hyperinflated. Bilateral nipple shadows are again seen. Swan-Ganz catheter is noted in the pulmonary outflow tract.  Nasogastric catheter is noted extending into the stomach. No infiltrate or sizable pneumothorax is seen. No bony abnormality is noted. IMPRESSION: Tubes and lines as described. No acute abnormality seen. Electronically Signed   By: Ruth [name] M.D.   On: 02/07/2016 12:48   Dg Abd Portable 1v  Result Date: 02/13/2016 CLINICAL DATA:  Status post aortic aneurysm repair EXAM: PORTABLE ABDOMEN - 1 VIEW COMPARISON:  None. FINDINGS: Postsurgical changes are seen. A nasogastric catheter is noted deep in the stomach. Degenerative changes of the lumbar spine are seen with evidence of stable L1 compression deformity. No soft tissue abnormality is seen. IMPRESSION: Postsurgical changes.  No acute abnormality noted. Electronically Signed   By: Ruth [name] M.D.   On: 01/26/2016 12:47   Anti-infectives: Anti-infectives    Start     Dose/Rate Route Frequency Ordered Stop   01/26/2016 2000  cefUROXime (ZINACEF) 1.5 g in dextrose 5 % 50 mL IVPB     1.5 g 100 mL/hr over 30 Minutes Intravenous Every 12 hours 02/23/2016 1325 02/01/16 0807   02/14/2016 0603  cefUROXime (ZINACEF) 1.5 g in dextrose 5 % 50 mL IVPB     1.5 g 100 mL/hr over 30 Minutes Intravenous 30 min pre-op 02/02/2016 0603 02/07/2016 0811      Assessment/Plan: s/p Procedure(s): ABDOMINAL AORTIC ANEURYSM REPAIR (N/A) Postoperative delirium. Remains hemodynamically stable. Tachycardia related to agitation. Have asked Dr. Titus [name] with critical care medicine to assist in her care. Will keep in ICU with her delirium   LOS: 2 days   Ruth [name] 02/02/2016, 10:18  AM

## 2016-02-02 NOTE — Care Management Important Message (Signed)
Important Message  Patient Details  Name: Ruth [name] MRN: [id number] Date of Birth: 1933-02-23   Medicare Important Message Given:  Yes    Edita Weyenberg, [place]oy Sea 02/02/2016, 2:50 PM

## 2016-02-02 NOTE — Progress Notes (Signed)
[facility] Progress Note Patient Name: Ruth [name] DOB: 1933-02-24 MRN: [id number]   Date of Service  02/02/2016  HPI/Events of Note  Oliguia - LVEF = 60-65%. Last CXR - no infiltrate or pulmonary congestion.   eICU Interventions  Will order: 1. Bolus with 0.9 NaCl 1 liter IV over 1 hour now.      Intervention Category Intermediate Interventions: Oliguria - evaluation and management  Marcos Ruelas [place] 02/02/2016, 6:05 PM

## 2016-02-02 NOTE — Progress Notes (Signed)
Notified E-line re: continues low urine output; orders received for bolus.  Will continue to monitor.

## 2016-02-03 ENCOUNTER — Inpatient Hospital Stay (HOSPITAL_COMMUNITY): Payer: Medicare Other

## 2016-02-03 DIAGNOSIS — N179 Acute kidney failure, unspecified: Secondary | ICD-10-CM

## 2016-02-03 DIAGNOSIS — J449 Chronic obstructive pulmonary disease, unspecified: Secondary | ICD-10-CM

## 2016-02-03 DIAGNOSIS — G9340 Encephalopathy, unspecified: Secondary | ICD-10-CM

## 2016-02-03 LAB — COMPREHENSIVE METABOLIC PANEL
ALBUMIN: 1.7 g/dL — AB (ref 3.5–5.0)
ALK PHOS: 124 U/L (ref 38–126)
ALT: 24 U/L (ref 14–54)
ANION GAP: 4 — AB (ref 5–15)
AST: 32 U/L (ref 15–41)
BUN: 21 mg/dL — AB (ref 6–20)
CO2: 20 mmol/L — AB (ref 22–32)
Calcium: 6.9 mg/dL — ABNORMAL LOW (ref 8.9–10.3)
Chloride: 113 mmol/L — ABNORMAL HIGH (ref 101–111)
Creatinine, Ser: 1.32 mg/dL — ABNORMAL HIGH (ref 0.44–1.00)
GFR calc Af Amer: 43 mL/min — ABNORMAL LOW (ref >60)
GFR calc non Af Amer: 37 mL/min — ABNORMAL LOW (ref >60)
GLUCOSE: 135 mg/dL — AB (ref 65–99)
POTASSIUM: 4.9 mmol/L (ref 3.5–5.1)
SODIUM: 137 mmol/L (ref 135–145)
Total Bilirubin: 0.8 mg/dL (ref 0.3–1.2)
Total Protein: 3.7 g/dL — ABNORMAL LOW (ref 6.5–8.1)

## 2016-02-03 LAB — CBC WITH DIFFERENTIAL/PLATELET
BASOS PCT: 0 %
Basophils Absolute: 0 10*3/uL (ref 0.0–0.1)
Eosinophils Absolute: 0 10*3/uL (ref 0.0–0.7)
Eosinophils Relative: 0 %
HEMATOCRIT: 24.4 % — AB (ref 36.0–46.0)
HEMOGLOBIN: 8.1 g/dL — AB (ref 12.0–15.0)
LYMPHS ABS: 0.3 10*3/uL — AB (ref 0.7–4.0)
LYMPHS PCT: 3 %
MCH: 32.3 pg (ref 26.0–34.0)
MCHC: 33.2 g/dL (ref 30.0–36.0)
MCV: 97.2 fL (ref 78.0–100.0)
MONOS PCT: 3 %
Monocytes Absolute: 0.3 10*3/uL (ref 0.1–1.0)
NEUTROS ABS: 9.9 10*3/uL — AB (ref 1.7–7.7)
Neutrophils Relative %: 94 %
Platelets: 99 10*3/uL — ABNORMAL LOW (ref 150–400)
RBC: 2.51 MIL/uL — AB (ref 3.87–5.11)
RDW: 14.1 % (ref 11.5–15.5)
WBC MORPHOLOGY: INCREASED
WBC: 10.5 10*3/uL (ref 4.0–10.5)

## 2016-02-03 LAB — GLUCOSE, CAPILLARY
GLUCOSE-CAPILLARY: 120 mg/dL — AB (ref 65–99)
GLUCOSE-CAPILLARY: 126 mg/dL — AB (ref 65–99)
GLUCOSE-CAPILLARY: 130 mg/dL — AB (ref 65–99)
GLUCOSE-CAPILLARY: 130 mg/dL — AB (ref 65–99)
GLUCOSE-CAPILLARY: 84 mg/dL (ref 65–99)
Glucose-Capillary: 145 mg/dL — ABNORMAL HIGH (ref 65–99)
Glucose-Capillary: 100 mg/dL — ABNORMAL HIGH (ref 65–99)

## 2016-02-03 MED ORDER — VITAL AF 1.2 CAL PO LIQD
1000.0000 mL | ORAL | Status: DC
  Administered 2016-02-03 – 2016-02-06 (×4): 1000 mL

## 2016-02-03 MED ORDER — IPRATROPIUM-ALBUTEROL 0.5-2.5 (3) MG/3ML IN SOLN
3.0000 mL | Freq: Four times a day (QID) | RESPIRATORY_TRACT | Status: DC
  Administered 2016-02-03 – 2016-02-06 (×12): 3 mL via RESPIRATORY_TRACT
  Filled 2016-02-03 (×10): qty 3

## 2016-02-03 MED ORDER — DEXTROSE-NACL 5-0.9 % IV SOLN
INTRAVENOUS | Status: DC
  Administered 2016-02-03 (×2): via INTRAVENOUS

## 2016-02-03 MED ORDER — VITAL HIGH PROTEIN PO LIQD: 1000.0000 mL | ORAL | Status: DC

## 2016-02-03 NOTE — Progress Notes (Addendum)
Initial Nutrition Assessment  DOCUMENTATION CODES:   Underweight  INTERVENTION:    Once CORTRAK tube placed & verified, initiate Vital AF 1.2 formula at 20 ml/hr  NUTRITION DIAGNOSIS:   Inadequate oral intake related to lethargy/confusion as evidenced by NPO status  GOAL:   Patient will meet greater than or equal to 90% of their needs  MONITOR:   TF tolerance, Labs, Weight trends, I & O's  REASON FOR ASSESSMENT:   Consult Enteral/tube feeding initiation and management  ASSESSMENT:   82 yo 1 ppd smoker x 61 years, recent TIA(7/17) without residuals who was scheduled for AAA repair in July but was postponed due to TIA. She had AAA repair 8/7 with cross clamp time of 40 minutes and extubated in OR with out issue. On 8/9 she has minimal urine output, Hypotension with sitting up along with syncope. Due to COPD , delerium, worsening renal failure PCCM asked to evaluate and assist in her care.   Patient s/p procedure 8/7: RESECTION AND GRAFTING ABDOMINAL AORTIC ANEURYSM   Pt with some confusion and agitation. [place] team placing feeding tube at time of visit. MD request to start TF at trickle rate.  Unable to complete Nutrition Focused Physical Exam at this time, however, suspect malnutrition.  Diet Order:  Diet NPO time specified  Skin:  Reviewed, no issues  Last BM:  8/10  Height:   Ht Readings from Last 1 Encounters:  02/17/2016 5' (1.524 m)    Weight:   Wt Readings from Last 1 Encounters:  02/01/16 91 lb 11.4 oz (41.6 kg)    Ideal Body Weight:  45.4 kg  BMI:  Body mass index is 17.91 kg/m.  Estimated Nutritional Needs:   Kcal:  1200-1400  Protein:  70-80 gm  Fluid:  >/= 1.5 L  EDUCATION NEEDS:   No education needs identified at this time  Arthur Holms, RD, LDN Pager #: [phone] After-Hours Pager #: [phone]

## 2016-02-03 NOTE — Progress Notes (Signed)
Physical Therapy Treatment Patient Details Name: Ruth [name] MRN: [id number] DOB: 1933-05-26 Today's Date: 02/03/2016    History of Present Illness Pt underwent AAA repair on 02/05/2016. PMH - COPD, Lt CVA, Cdiff, L1 compression fx, osteoporosis.    PT Comments    Pt remains confused. Able to tolerate OOB to chair so is making progress but it is slow.  Follow Up Recommendations  SNF     Equipment Recommendations  Other (comment) (to be assessed)    Recommendations for Other Services       Precautions / Restrictions Precautions Precautions: Fall;Other (comment) Precaution Comments: watch BP Restrictions Weight Bearing Restrictions: No    Mobility  Bed Mobility Overal bed mobility: Needs Assistance Bed Mobility: Supine to Sit     Supine to sit: Max assist;+2 for physical assistance     General bed mobility comments: Pt already moving BLE off bed due to confusion. Assist for trunk support to come to sitting position. Pt required min-max assist for sitting balance as pt leaning heavily back in attempts to lay back down.  Transfers Overall transfer level: Needs assistance Equipment used: 2 person hand held assist Transfers: Sit to/from [facility] Sit to Stand: +2 physical assistance;Mod assist Stand pivot transfers: +2 physical assistance;Mod assist       General transfer comment: Assist to bring hips up and for support for pivotal steps to chair.   Ambulation/Gait                 Stairs            Wheelchair Mobility    Modified Rankin (Stroke Patients Only)       Balance Overall balance assessment: Needs assistance Sitting-balance support: Bilateral upper extremity supported Sitting balance-Leahy Scale: Poor Sitting balance - Comments: Pt sat EOB 7-8 minutes with min to max A. At times pt trying to lie down. Postural control: Posterior lean                          Cognition Arousal/Alertness: Lethargic Behavior  During Therapy: Flat affect;Anxious Overall Cognitive Status: Impaired/Different from baseline Area of Impairment: Orientation;Attention;Memory;Following commands;Safety/judgement;Awareness;Problem solving Orientation Level: Disoriented to;Place;Time;Situation Current Attention Level: Focused Memory: Decreased short-term memory Following Commands: Follows one step commands inconsistently;Follows one step commands with increased time Safety/Judgement: Decreased awareness of safety;Decreased awareness of deficits Awareness: Intellectual Problem Solving: Slow processing;Decreased initiation;Difficulty sequencing;Requires verbal cues;Requires tactile cues General Comments: Continues to be confused and lethargic. Required max multimodal cues to open eyes and respond to therapists.    Exercises      General Comments        Pertinent Vitals/Pain Pain Assessment: Faces Faces Pain Scale: Hurts little more Pain Location: generalized Pain Descriptors / Indicators: Grimacing Pain Intervention(s): Limited activity within patient's tolerance;Monitored during session;Repositioned    Home Living Family/patient expects to be discharged to:: [place]: [facility] Rile - 2 wheels;Shower seat - built in Additional Comments: Lived in ALF but will need SNF.     Prior Function Level of Independence: Needs assistance  Gait / Transfers Assistance Needed: amb independently ? use of walker       PT Goals (current goals can now be found in the care plan section) Progress towards PT goals: Progressing toward goals    Frequency  Min 3X/week    PT Plan Current plan remains appropriate    Co-evaluation PT/OT/SLP Co-Evaluation/Treatment:  Yes Reason for Co-Treatment: Complexity of the patient's impairments (multi-system involvement);For patient/therapist safety PT goals addressed during session: Mobility/safety with mobility;Balance OT goals addressed during  session: ADL's and self-care     End of Session Equipment Utilized During Treatment: Gait belt;Oxygen Activity Tolerance: Patient limited by fatigue;Patient limited by lethargy Patient left: in chair;with call bell/phone within reach;with chair alarm set;with nursing/sitter in room     Time: [id number] PT Time Calculation (min) (ACUTE ONLY): 23 min  Charges:  $Gait Training: 8-22 mins                    G Codes:      Mikhaela Zaugg 2016/02/09, 12:23 PM [employer] PT [phone]

## 2016-02-03 NOTE — Progress Notes (Signed)
Occupational Therapy Evaluation Patient Details Name: Ruth [name] MRN: [id number] DOB: 14-Oct-1932 Today's Date: 02/03/2016    History of Present Illness Pt underwent AAA repair on 02/03/2016. PMH - COPD, Lt CVA, Cdiff, L1 compression fx, osteoporosis.   Clinical Impression   Unsure of pt's PLOF or setup at ALF as pt continues to be confused and lethargic and no family was present during OT eval. Pt tolerated sitting EOB and ranged from min-max assist with required assistance due to poor trunk control and confusion. Pt completed stand-pivot transfer to chair with mod +2 assist. Recommend SNF for post-acute rehab. Pt will benefit from continued acute OT to increase independence and safety with ADLs and mobility.     Follow Up Recommendations  SNF;Supervision/Assistance - 24 hour    Equipment Recommendations  Other (comment) (TBD at next venue)    Recommendations for Other Services       Precautions / Restrictions Precautions Precautions: Fall;Other (comment) Precaution Comments: watch BP Restrictions Weight Bearing Restrictions: No      Mobility Bed Mobility Overal bed mobility: Needs Assistance Bed Mobility: Supine to Sit     Supine to sit: Max assist;+2 for physical assistance     General bed mobility comments: Pt already moving BLE off bed due to confusion. Assist for trunk support to come to sitting position. Pt required min-max assist for sitting balance as pt leaning heavily back in attempts to lay back down.  Transfers Overall transfer level: Needs assistance Equipment used: 2 person hand held assist Transfers: Sit to/from [facility] Sit to Stand: +2 physical assistance;Mod assist Stand pivot transfers: +2 physical assistance;Mod assist       General transfer comment: Assist to bring hips up and for support for pivotal steps to chair.     Balance Overall balance assessment: Needs assistance Sitting-balance support: Bilateral upper extremity  supported Sitting balance-Leahy Scale: Poor Sitting balance - Comments: Pt sat EOB 7-8 minutes with min to max A. At times pt trying to lie down. Postural control: Posterior lean Standing balance support: Bilateral upper extremity supported Standing balance-Leahy Scale: Zero Standing balance comment: +2 assist to maintain balance                            ADL Overall ADL's : Needs assistance/impaired         Upper Body Bathing: Total assistance;Bed level   Lower Body Bathing: Total assistance;+2 for physical assistance;Bed level   Upper Body Dressing : Total assistance;Bed level   Lower Body Dressing: Total assistance;+2 for physical assistance;Bed level   Toilet Transfer: Moderate assistance;+2 for physical assistance;Stand-pivot;BSC   Toileting- Clothing Manipulation and Hygiene: Moderate assistance;+2 for physical assistance;Sit to/from stand       Functional mobility during ADLs: Moderate assistance;+2 for physical assistance General ADL Comments: Total assist for ADLs due to lethargy and confusion. Pt completed stand-pivot well with light mod +2 assist.     Vision Additional Comments: Unsure as pt was lethargic and a poor historian   Perception     Praxis      Pertinent Vitals/Pain Pain Assessment: Faces Faces Pain Scale: Hurts little more Pain Location: generalized Pain Descriptors / Indicators: Grimacing Pain Intervention(s): Limited activity within patient's tolerance;Monitored during session;Repositioned     Hand Dominance Right   Extremity/Trunk Assessment Upper Extremity Assessment Upper Extremity Assessment: Difficult to assess due to impaired cognition   Lower Extremity Assessment Lower Extremity Assessment: Generalized weakness;Difficult to assess due to impaired  cognition   Cervical / Trunk Assessment Cervical / Trunk Assessment: Kyphotic   Communication Communication Communication: HOH   Cognition Arousal/Alertness:  Lethargic Behavior During Therapy: Flat affect;Anxious Overall Cognitive Status: Impaired/Different from baseline Area of Impairment: Orientation;Attention;Memory;Following commands;Safety/judgement;Awareness;Problem solving Orientation Level: Disoriented to;Place;Time;Situation Current Attention Level: Focused Memory: Decreased short-term memory Following Commands: Follows one step commands inconsistently;Follows one step commands with increased time Safety/Judgement: Decreased awareness of safety;Decreased awareness of deficits Awareness: Intellectual Problem Solving: Slow processing;Decreased initiation;Difficulty sequencing;Requires verbal cues;Requires tactile cues General Comments: Continues to be confused and lethargic. Required max multimodal cues to open eyes and respond to therapists.   General Comments       Exercises       Shoulder Instructions      Home Living Family/patient expects to be discharged to:: Skilled nursing facility                             Home Equipment: [facility] Rile - 2 wheels;Shower seat - built in   Additional Comments: Lived in ALF but will need SNF.       Prior Functioning/Environment Level of Independence: Needs assistance  Gait / Transfers Assistance Needed: amb independently ? use of walker          OT Diagnosis: Generalized weakness;Cognitive deficits;Acute pain;Altered mental status   OT Problem List: Decreased strength;Decreased range of motion;Decreased activity tolerance;Impaired balance (sitting and/or standing);Decreased coordination;Decreased cognition;Decreased safety awareness;Decreased knowledge of use of DME or AE;Cardiopulmonary status limiting activity;Pain   OT Treatment/Interventions: Self-care/ADL training;Therapeutic exercise;DME and/or AE instruction;Therapeutic activities;Cognitive remediation/compensation;Patient/family education;Balance training;Neuromuscular education    OT Goals(Current goals can be found  in the care plan section) Acute Rehab OT Goals Patient Stated Goal: Pt unable to state OT Goal Formulation: Patient unable to participate in goal setting Time For Goal Achievement: 02/17/16 Potential to Achieve Goals: Good ADL Goals Pt Will Perform Grooming: with set-up;sitting Pt Will Perform Upper Body Bathing: with set-up;sitting Pt Will Perform Lower Body Bathing: with min assist;sit to/from stand Pt Will Transfer to Toilet: with min assist;ambulating;bedside commode (over toilet) Pt Will Perform Toileting - Clothing Manipulation and hygiene: with min assist;sit to/from stand  OT Frequency: Min 2X/week   Barriers to D/C:            Co-evaluation PT/OT/SLP Co-Evaluation/Treatment: Yes Reason for Co-Treatment: Complexity of the patient's impairments (multi-system involvement);For patient/therapist safety PT goals addressed during session: Mobility/safety with mobility;Balance OT goals addressed during session: ADL's and self-care      End of Session Equipment Utilized During Treatment: Gait belt Nurse Communication: Mobility status  Activity Tolerance: Patient limited by lethargy Patient left: in chair;with call bell/phone within reach;with nursing/sitter in room;with chair alarm set   Time: [phone] OT Time Calculation (min): 23 min Charges:  OT General Charges $OT Visit: 1 Procedure OT Evaluation $OT Eval Moderate Complexity: 1 Procedure G-Codes:    Redmond Baseman, OTR/L [phone] 02/03/2016, 1:22 PM

## 2016-02-03 NOTE — Progress Notes (Signed)
PULMONARY / CRITICAL CARE MEDICINE   Name: Ruth [name] MRN: [id number] DOB: [birth year]    ADMISSION DATE:  02/10/2016 CONSULTATION DATE:  8/9  REFERRING MD:  Early  CHIEF COMPLAINT:  Post AAA repair 8/7 with decreased urine output and rising creatine 8/9.  HISTORY OF PRESENT ILLNESS:   82 yo 1 ppd smoker x 61 years, recent TIA(7/17) without residuals who was scheduled for AAA repair in July but was postponed due to TIA. She had AAA repair 8/7 with cross clamp time of 40 minutes and extubated in OR with out issue. On 8/9 she has minimal urine output, Hypotension with sitting up along with syncope. Due to COPD , delerium, worsening renal failure PCCM asked to evaluate and assist in her care.      SUBJECTIVE:  Remains confused , weak cough - unable to expectorate Afebrile UO poor but better last 12 h   VITAL SIGNS: BP (!) 127/53   Pulse (!) 117   Temp 98.1 F (36.7 C) (Oral)   Resp 20   Ht 5' (1.524 m)   Wt 91 lb 11.4 oz (41.6 kg)   SpO2 100%   BMI 17.91 kg/m   HEMODYNAMICS:    VENTILATOR SETTINGS:    INTAKE / OUTPUT: I/O last 3 completed shifts: In: [id number] [I.V.:3600; Other:10; IV Piggyback:1550] Out: 840 [Urine:840]  PHYSICAL EXAMINATION: General: Thin HOH WF in no distress Neuro: Follows commands by MAEx4, mild confusion,  Oriented to self & address HEENT:  No JVD, No LAN, oral mucosa dry Cardiovascular:  HSR RRR ST 123 Lungs:  CTA Abdomen:  Surgical site intact Musculoskeletal:  intact Skin:  Extensive ecchymosis over chest and abd  LABS:  BMET  Recent Labs Lab 02/01/16 0337 02/02/16 1045 02/03/16 0401  NA 139 135 137  K 4.5 4.9 4.9  CL 113* 111 113*  CO2 22 21* 20*  BUN 19 26* 21*  CREATININE 1.40* 1.52* 1.32*  GLUCOSE 76 77 135*    Electrolytes  Recent Labs Lab 02/19/2016 1300 02/01/16 0337 02/02/16 1045 02/03/16 0401  CALCIUM 7.9* 7.6* 7.3* 6.9*  MG 1.6* 2.4 2.3  --   PHOS  --   --  2.4*  --     CBC  Recent Labs Lab  02/01/16 0337 02/02/16 1045 02/03/16 0401  WBC 21.7* 18.0* 10.5  HGB 10.5* 8.5* 8.1*  HCT 32.4* 25.8* 24.4*  PLT 111* 97* 99*    Coag's  Recent Labs Lab 02/14/2016 1400 02/02/16 1045  APTT 28 47*  INR 1.27 1.27    Sepsis Markers No results for input(s): LATICACIDVEN, PROCALCITON, O2SATVEN in the last 168 hours.  ABG  Recent Labs Lab 02/07/2016 1448 02/01/16 0351 02/02/16 1409  PHART 7.349* 7.328* 7.368  PCO2ART 50.8* 42.3 30.6*  PO2ART 202.0* 123* 62.0*    Liver Enzymes  Recent Labs Lab 02/01/16 0337 02/03/16 0401  AST 35 32  ALT 22 24  ALKPHOS 83 124  BILITOT 0.6 0.8  ALBUMIN 2.4* 1.7*    Cardiac Enzymes No results for input(s): TROPONINI, PROBNP in the last 168 hours.  Glucose  Recent Labs Lab 02/02/16 1145 02/02/16 1550 02/02/16 2042 02/02/16 2338 02/03/16 0340 02/03/16 0755  GLUCAP 81 79 97 155* 145* 100*    Imaging Dg Chest Port 1 View  Result Date: 02/03/2016 CLINICAL DATA:  Respiratory failure.  Cough. EXAM: PORTABLE CHEST 1 VIEW COMPARISON:  02/01/2016, 02/19/2016 and 06/20/2015 FINDINGS: Central venous sheath remains in the superior vena cava. NG tube is been removed. New haziness  at both bases consistent with small bilateral effusions, left more than right. Heart size and pulmonary vascularity appear normal. IMPRESSION: Interval development of small bilateral pleural effusions, left more than right. Electronically Signed   By: Lorriane Shire M.D.   On: 02/03/2016 07:33     STUDIES:    CULTURES: none  ANTIBIOTICS: None  SIGNIFICANT EVENTS: 8/7 AAA repair  LINES/TUBES: 8/7 rt I j cordis>> 8/7 ett>>8/7 8/7 aline>>8/8  DISCUSSION:  Delerium persists but slight better AKI improving  ASSESSMENT / PLAN:  PULMONARY A: Severe COPD, smoker  FEV1 (L) *0.97 FEV1/FVC (%) *55  P:   Minimise O2 as needed  Pulmonary toilet Duonebs  CARDIOVASCULAR A:  Syncope  Hypertensive hx Orthostatic hypotension AAA repair 8/7 P:   Ct fluids   RENAL Lab Results  Component Value Date   CREATININE 1.32 (H) 02/03/2016   CREATININE 1.52 (H) 02/02/2016   CREATININE 1.40 (H) 02/01/2016   CREATININE 1.1 04/20/2015   CREATININE 1.5 (H) 04/14/2014   CREATININE 1.2 (H) 04/01/2013    A:   AKI post AAA 8/7 P:   Continue hydration May need US kidneys if creatine worsens   GASTROINTESTINAL A:   Currently (8-9) NPO post AAA 8/7. P:   Sips / chips and advance diet May need Feeding tube  HEMATOLOGIC  Recent Labs  02/02/16 1045 02/03/16 0401  HGB 8.5* 8.1*    A:   Recent AAA repair 8/7 P:  Follow hgb, target 8 & above  INFECTIOUS A:   Leukocytosis with out fever P:   No abx at this  Monitor for infectious process    ENDOCRINE A:   No acute issue  P:   Follow glucose  NEUROLOGIC A:   Mild confusion post op Hx of TIA earlier in 7/17 P:   RASS goal: 0 Minimize sedation, haldol prn agitation only Sitter   FAMILY  - Updates:   - Inter-disciplinary family meet or Palliative Care meeting due by:  8/15   cctime x 2m  Kara Mead MD. FCCP. [facility] Pager [phone] If no response call [phone]    02/03/2016, 8:59 AM

## 2016-02-03 NOTE — Progress Notes (Signed)
Subjective: Interval History: none.. Alert this morning. Confused. No respiratory distress. Reports that she is very tired. Asking where Ruth [name] is who is her husband. Explained he would be by later this morning tachycardic when agitated  Objective: Vital signs in last 24 hours: Temp:  [97.3 F (36.3 C)-99.3 F (37.4 C)] 97.5 F (36.4 C) (08/10 0344) Pulse Rate:  [111-134] 119 (08/10 0600) Resp:  [16-37] 23 (08/10 0600) BP: (101-156)/(47-135) 113/95 (08/10 0600) SpO2:  [84 %-100 %] 100 % (08/10 0600)  Intake/Output from previous day: 08/09 0701 - 08/10 0700 In: 3850 [I.V.:2300; IV Piggyback:1550] Out: 600 [Urine:600] Intake/Output this shift: No intake/output data recorded.  Abdomen soft and nontender. Hypoactive bowel sounds. 2+ femoral pulses.  Lab Results:  Recent Labs  02/02/16 1045 02/03/16 0401  WBC 18.0* 10.5  HGB 8.5* 8.1*  HCT 25.8* 24.4*  PLT 97* 99*   BMET  Recent Labs  02/02/16 1045 02/03/16 0401  NA 135 137  K 4.9 4.9  CL 111 113*  CO2 21* 20*  GLUCOSE 77 135*  BUN 26* 21*  CREATININE 1.52* 1.32*  CALCIUM 7.3* 6.9*    Studies/Results: Dg Chest Port 1 View  Result Date: 02/01/2016 CLINICAL DATA:  AAA repair EXAM: PORTABLE CHEST 1 VIEW COMPARISON:  02/15/2016 FINDINGS: Swan-Ganz catheter removed. Sheath remains in the proximal SVC. NG tube remains in the stomach. Lungs remain clear without infiltrate or effusion. Negative for heart failure. IMPRESSION: Swan-Ganz catheter removed.  Lungs remain clear. Electronically Signed   By: Franchot Gallo M.D.   On: 02/01/2016 07:46   Dg Chest Port 1 View  Result Date: 02/21/2016 CLINICAL DATA:  Status post AAA repair EXAM: PORTABLE CHEST 1 VIEW COMPARISON:  06/20/2015 FINDINGS: Cardiac shadow is within normal limits. The lungs are again hyperinflated. Bilateral nipple shadows are again seen. Swan-Ganz catheter is noted in the pulmonary outflow tract. Nasogastric catheter is noted extending into the stomach. No  infiltrate or sizable pneumothorax is seen. No bony abnormality is noted. IMPRESSION: Tubes and lines as described. No acute abnormality seen. Electronically Signed   By: Inez Catalina M.D.   On: 02/19/2016 12:48   Dg Abd Portable 1v  Result Date: 01/28/2016 CLINICAL DATA:  Status post aortic aneurysm repair EXAM: PORTABLE ABDOMEN - 1 VIEW COMPARISON:  None. FINDINGS: Postsurgical changes are seen. A nasogastric catheter is noted deep in the stomach. Degenerative changes of the lumbar spine are seen with evidence of stable L1 compression deformity. No soft tissue abnormality is seen. IMPRESSION: Postsurgical changes.  No acute abnormality noted. Electronically Signed   By: Inez Catalina M.D.   On: 01/30/2016 12:47   Anti-infectives: Anti-infectives    Start     Dose/Rate Route Frequency Ordered Stop   02/19/2016 2000  cefUROXime (ZINACEF) 1.5 g in dextrose 5 % 50 mL IVPB     1.5 g 100 mL/hr over 30 Minutes Intravenous Every 12 hours 01/28/2016 1325 02/01/16 0807   02/07/2016 0603  cefUROXime (ZINACEF) 1.5 g in dextrose 5 % 50 mL IVPB     1.5 g 100 mL/hr over 30 Minutes Intravenous 30 min pre-op 02/11/2016 0603 02/24/2016 0811      Assessment/Plan: s/p Procedure(s): ABDOMINAL AORTIC ANEURYSM REPAIR (N/A) Stable overall. Main concerns continue to be respiratory and confusion. Appreciate critical care medicine's input. We'll continue to mobilize. Will have a sitter this morning to allow her to have no restraints.   LOS: 3 days   Early, Todd 02/03/2016, 7:23 AM

## 2016-02-04 ENCOUNTER — Inpatient Hospital Stay (HOSPITAL_COMMUNITY): Payer: Medicare Other

## 2016-02-04 LAB — BLOOD GAS, ARTERIAL
ACID-BASE DEFICIT: 6.6 mmol/L — AB (ref 0.0–2.0)
ACID-BASE DEFICIT: 4 mmol/L — AB (ref 0.0–2.0)
Bicarbonate: 18.4 mEq/L — ABNORMAL LOW (ref 20.0–24.0)
Bicarbonate: 21.1 mEq/L (ref 20.0–24.0)
Drawn by: 42624
Drawn by: 236041
FIO2: 0.5
FIO2: 50
O2 Saturation: 93.7 %
O2 Saturation: 96.5 %
PATIENT TEMPERATURE: 98.6
PATIENT TEMPERATURE: 98.6
PCO2 ART: 37.4 mmHg (ref 35.0–45.0)
PCO2 ART: 42.2 mmHg (ref 35.0–45.0)
TCO2: 19.6 mmol/L (ref 0–100)
TCO2: 22.4 mmol/L (ref 0–100)
pH, Arterial: 7.314 — ABNORMAL LOW (ref 7.350–7.450)
pH, Arterial: 7.32 — ABNORMAL LOW (ref 7.350–7.450)
pO2, Arterial: 73.1 mmHg — ABNORMAL LOW (ref 80.0–100.0)
pO2, Arterial: 90 mmHg (ref 80.0–100.0)

## 2016-02-04 LAB — BASIC METABOLIC PANEL
ANION GAP: 4 — AB (ref 5–15)
BUN: 20 mg/dL (ref 6–20)
CALCIUM: 7.2 mg/dL — AB (ref 8.9–10.3)
CHLORIDE: 116 mmol/L — AB (ref 101–111)
CO2: 19 mmol/L — AB (ref 22–32)
Creatinine, Ser: 1.25 mg/dL — ABNORMAL HIGH (ref 0.44–1.00)
GFR calc non Af Amer: 40 mL/min — ABNORMAL LOW (ref >60)
GFR, EST AFRICAN AMERICAN: 46 mL/min — AB (ref >60)
Glucose, Bld: 175 mg/dL — ABNORMAL HIGH (ref 65–99)
POTASSIUM: 4.1 mmol/L (ref 3.5–5.1)
Sodium: 139 mmol/L (ref 135–145)

## 2016-02-04 LAB — GLUCOSE, CAPILLARY
GLUCOSE-CAPILLARY: 166 mg/dL — AB (ref 65–99)
GLUCOSE-CAPILLARY: 137 mg/dL — AB (ref 65–99)
Glucose-Capillary: 110 mg/dL — ABNORMAL HIGH (ref 65–99)
Glucose-Capillary: 154 mg/dL — ABNORMAL HIGH (ref 65–99)
Glucose-Capillary: 77 mg/dL (ref 65–99)
Glucose-Capillary: 95 mg/dL (ref 65–99)

## 2016-02-04 LAB — CBC
HEMATOCRIT: 23.1 % — AB (ref 36.0–46.0)
HEMOGLOBIN: 7.5 g/dL — AB (ref 12.0–15.0)
MCH: 31.4 pg (ref 26.0–34.0)
MCHC: 32.5 g/dL (ref 30.0–36.0)
MCV: 96.7 fL (ref 78.0–100.0)
Platelets: 102 10*3/uL — ABNORMAL LOW (ref 150–400)
RBC: 2.39 MIL/uL — AB (ref 3.87–5.11)
RDW: 14.4 % (ref 11.5–15.5)
WBC: 8.1 10*3/uL (ref 4.0–10.5)

## 2016-02-04 MED ORDER — SODIUM CHLORIDE 0.9 % IV SOLN: INTRAVENOUS | Status: DC

## 2016-02-04 MED ORDER — ALTEPLASE 2 MG IJ SOLR: 2.0000 mg | Freq: Once | INTRAMUSCULAR | Status: DC

## 2016-02-04 MED ORDER — SODIUM BICARBONATE 8.4 % IV SOLN
INTRAVENOUS | Status: DC
  Administered 2016-02-04 – 2016-02-05 (×2): via INTRAVENOUS
  Filled 2016-02-04 (×3): qty 150

## 2016-02-04 NOTE — Progress Notes (Signed)
[facility] Progress Note Patient Name: Ruth [name] DOB: 30-May-1933 MRN: [id number]   Date of Service  02/04/2016  HPI/Events of Note  Notified that patient with increasing lethargy, difficulty managing secretions. Has required NTS a few times and uptitration of O2.   eICU Interventions  Will check ABG and CXR now, attempt to minimize sedation, push pulm hygiene     Intervention Category Major Interventions: Respiratory failure - evaluation and management  Aleshka Corney S. 02/04/2016, 4:04 AM

## 2016-02-04 NOTE — Progress Notes (Addendum)
AAA Progress Note    02/04/2016 10:35 AM 4 Days Post-Op  Subjective:  Says she's passing gas  Tm 99.3 now afebrile HR  110's-130's ST [id number] systolic 0000000 0000000  IVF at 75cc/hr  Vitals:   02/04/16 0800 02/04/16 0834  BP: (!) 123/44   Pulse: (!) 115   Resp: 17   Temp:  98.4 F (36.9 C)    Physical Exam: Cardiac:  tachy Lungs:  Coarse throughout Abdomen:  Mildly firm but non tender to palpation; BM yesterday per chart; +flatus per pt Incisions:  Clean and dry with steri strips in place Extremities:  Easily palpable bilateral femoral and DP pulses.    CBC    Component Value Date/Time   WBC 8.1 02/04/2016 0435   RBC 2.39 (L) 02/04/2016 0435   HGB 7.5 (L) 02/04/2016 0435   HGB 15.4 04/20/2015 1514   HCT 23.1 (L) 02/04/2016 0435   HCT 46.6 04/20/2015 1514   PLT 102 (L) 02/04/2016 0435   PLT 207 04/20/2015 1514   MCV 96.7 02/04/2016 0435   MCV 95.6 04/20/2015 1514   MCH 31.4 02/04/2016 0435   MCHC 32.5 02/04/2016 0435   RDW 14.4 02/04/2016 0435   RDW 13.6 04/20/2015 1514   LYMPHSABS 0.3 (L) 02/03/2016 0401   LYMPHSABS 1.8 04/20/2015 1514   MONOABS 0.3 02/03/2016 0401   MONOABS 0.5 04/20/2015 1514   EOSABS 0.0 02/03/2016 0401   EOSABS 0.1 04/20/2015 1514   BASOSABS 0.0 02/03/2016 0401   BASOSABS 0.1 04/20/2015 1514    BMET    Component Value Date/Time   NA 139 02/04/2016 0435   NA 141 04/20/2015 1514   K 4.1 02/04/2016 0435   K 4.1 04/20/2015 1514   CL 116 (H) 02/04/2016 0435   CL 105 03/26/2012 1145   CO2 19 (L) 02/04/2016 0435   CO2 30 (H) 04/20/2015 1514   GLUCOSE 175 (H) 02/04/2016 0435   GLUCOSE 119 04/20/2015 1514   GLUCOSE 89 03/26/2012 1145   BUN 20 02/04/2016 0435   BUN 16.1 04/20/2015 1514   CREATININE 1.25 (H) 02/04/2016 0435   CREATININE 1.1 04/20/2015 1514   CALCIUM 7.2 (L) 02/04/2016 0435   CALCIUM 9.6 04/20/2015 1514   GFRNONAA 40 (L) 02/04/2016 0435   GFRAA 46 (L) 02/04/2016 0435    INR    Component Value Date/Time   INR 1.27 02/02/2016 1045     Intake/Output Summary (Last 24 hours) at 02/04/16 1035 Last data filed at 02/04/16 0800  Gross per 24 hour  Intake             2009 ml  Output              480 ml  Net             1529 ml     Assessment/Plan:  82 y.o. female is s/p  Resection and grafting abdominal aortic aneurysm with a straight 98mm Hemashield graft 4 Days Post-Op  -pt alert this morning -she has easily palpable femoral and DP pulses bilaterally -acute surgical blood loss anemia-hgb 7.5-may need unit of blood - BP tolerating at this point.  Will d/w Dr. Donnetta Hutching (pt's EF 60-65% in June 2017) -creatinine improving-UOP had dropped off, but is picking up -continue pulmonary toilet/nebs per pulmonary-appreciate their assistance. -thrombocytopenia-plts up slightly this morning-continue to monitor-if drops again, may need HIT panel given pt is on Lovenox. -DVT prophylaxis - Lovenox.   Leontine Locket, PA-C [facility] [phone] 02/04/2016 10:35 AM Still concern  regarding postop delirium. She is not having any ear respiratory distress at this time. Is having difficulty controlling her secretions. Veins hemodynamically stable.  Discussed her care with Dr.Alva  Also discussed by telephone with her husband [name]. Splane this is a difficult situation with her delirium and inability to cooperate with her pulmonary toilet. Currently respiratory is stable. Discussed that if this deteriorates she may have to be placed on a vent. He strongly feels that she needs to be placed on a ventilator if this is required. Agree with continued maximal support.

## 2016-02-04 NOTE — Progress Notes (Signed)
Peripherally Inserted Central Catheter/Midline Placement  The IV Nurse has discussed with the patient and/or persons authorized to consent for the patient, the purpose of this procedure and the potential benefits and risks involved with this procedure.  The benefits include less needle sticks, lab draws from the catheter and patient may be discharged home with the catheter.  Risks include, but not limited to, infection, bleeding, blood clot (thrombus formation), and puncture of an artery; nerve damage and irregular heat beat.  Alternatives to this procedure were also discussed.  PICC/Midline Placement Documentation     Husband signed consent at bedside   Synthia Innocent 02/04/2016, 6:20 PM

## 2016-02-04 NOTE — Care Management Important Message (Signed)
Important Message  Patient Details  Name: Ruth [name] MRN: [id number] Date of Birth: January 31, 1933   Medicare Important Message Given:  Yes    Loann Quill 02/04/2016, 1:24 PM

## 2016-02-04 NOTE — Care Management Important Message (Signed)
Important Message  Patient Details  Name: Ruth [name] MRN: [id number] Date of Birth: 1933-01-18   Medicare Important Message Given:  Yes    Nathen May 02/04/2016, 10:38 AM

## 2016-02-04 NOTE — Progress Notes (Signed)
PULMONARY / CRITICAL CARE MEDICINE   Name: Ruth [name] MRN: [id number] DOB: 1933/02/22    ADMISSION DATE:  01/25/2016 CONSULTATION DATE:  8/9  REFERRING MD:  Early  CHIEF COMPLAINT:  Post AAA repair 8/7 with decreased urine output and rising creatine 8/9.  HISTORY OF PRESENT ILLNESS:   82 yo 1 ppd smoker x 61 years, recent TIA(7/17) without residuals who was scheduled for AAA repair in July but was postponed due to TIA. She had AAA repair 8/7 with cross clamp time of 40 minutes and extubated in OR with out issue. On 8/9 she has minimal urine output, Hypotension with sitting up along with syncope. Due to COPD , delerium, worsening renal failure PCCM asked to evaluate and assist in her care.    SUBJECTIVE:  Quite weak appearing , weak cough - unable to expectorate Afebrile UO poor but improving cr   VITAL SIGNS: BP (!) 123/44 (BP Location: Right Arm)   Pulse (!) 115   Temp 98.4 F (36.9 C) (Axillary)   Resp 17   Ht 5' (1.524 m)   Wt 104 lb 4.4 oz (47.3 kg)   SpO2 (!) 88%   BMI 20.37 kg/m   HEMODYNAMICS:    VENTILATOR SETTINGS: FiO2 (%):  [50 %] 50 %  INTAKE / OUTPUT: I/O last 3 completed shifts: In: 3422.3 [I.V.:3063.3; NG/GT:309; IV Piggyback:50] Out: 1005 [Urine:1005]  PHYSICAL EXAMINATION: General: Thin HOH WF in no distress Neuro: Follows commands , mild confusion,   Sleepy this am HEENT:  No JVD, No LAN, oral mucosa dry Cardiovascular:  HSR RRR  Lungs:  CTA Abdomen:  Surgical site intact Musculoskeletal:  intact Skin:  Extensive ecchymosis over chest and abd  LABS:  BMET  Recent Labs Lab 02/02/16 1045 02/03/16 0401 02/04/16 0435  NA 135 137 139  K 4.9 4.9 4.1  CL 111 113* 116*  CO2 21* 20* 19*  BUN 26* 21* 20  CREATININE 1.52* 1.32* 1.25*  GLUCOSE 77 135* 175*    Electrolytes  Recent Labs Lab 02/20/2016 1300 02/01/16 0337 02/02/16 1045 02/03/16 0401 02/04/16 0435  CALCIUM 7.9* 7.6* 7.3* 6.9* 7.2*  MG 1.6* 2.4 2.3  --   --   PHOS  --    --  2.4*  --   --     CBC  Recent Labs Lab 02/02/16 1045 02/03/16 0401 02/04/16 0435  WBC 18.0* 10.5 8.1  HGB 8.5* 8.1* 7.5*  HCT 25.8* 24.4* 23.1*  PLT 97* 99* 102*    Coag's  Recent Labs Lab 02/07/2016 1400 02/02/16 1045  APTT 28 47*  INR 1.27 1.27    Sepsis Markers No results for input(s): LATICACIDVEN, PROCALCITON, O2SATVEN in the last 168 hours.  ABG  Recent Labs Lab 02/01/16 0351 02/02/16 1409 02/04/16 0423  PHART 7.328* 7.368 7.314*  PCO2ART 42.3 30.6* 37.4  PO2ART 123* 62.0* 73.1*    Liver Enzymes  Recent Labs Lab 02/01/16 0337 02/03/16 0401  AST 35 32  ALT 22 24  ALKPHOS 83 124  BILITOT 0.6 0.8  ALBUMIN 2.4* 1.7*    Cardiac Enzymes No results for input(s): TROPONINI, PROBNP in the last 168 hours.  Glucose  Recent Labs Lab 02/03/16 1156 02/03/16 1653 02/03/16 1943 02/03/16 2353 02/04/16 0345 02/04/16 0830  GLUCAP 84 130* 126* 120* 166* 110*    Imaging Dg Chest Port 1 View  Result Date: 02/04/2016 CLINICAL DATA:  Hypoxemia and COPD EXAM: PORTABLE CHEST 1 VIEW COMPARISON:  02/03/2016 FINDINGS: Cardiac shadow is stable. Increasing density is  noted over the bases bilaterally consistent with posterior pleural effusions. Left retrocardiac atelectasis is noted increased from the prior exam. Right jugular sheath is again noted and stable. No bony abnormality is noted. IMPRESSION: Left retrocardiac atelectasis. Bilateral pleural effusions posteriorly. Electronically Signed   By: Inez Catalina M.D.   On: 02/04/2016 07:28   Dg Abd Portable 1v  Result Date: 02/03/2016 CLINICAL DATA:  Encounter for nasogastric tube placement. EXAM: PORTABLE ABDOMEN - 1 VIEW COMPARISON:  Radiograph of January 31, 2016. FINDINGS: The bowel gas pattern is normal. Distal tip of feeding tube is seen in expected position of distal stomach. Surgical staples are seen in the left perispinal region. IMPRESSION: Distal tip of feeding tube seen in expected position of distal  stomach. Electronically Signed   By: Marijo Conception, M.D.   On: 02/03/2016 16:56     STUDIES:    CULTURES: none  ANTIBIOTICS: None  SIGNIFICANT EVENTS: 8/7 AAA repair  LINES/TUBES: 8/7 rt I j cordis>> 8/7 ett>>8/7 8/7 aline>>8/8  DISCUSSION:  Delerium persists but slight better AKI improving, NAG acidosis  ASSESSMENT / PLAN:  PULMONARY A: Severe COPD, smoker  FEV1 (L) *0.97 FEV1/FVC (%) *55  P:   Minimise O2 as needed  Pulmonary toilet Duonebs  CARDIOVASCULAR A:  Syncope  Hypertensive hx Orthostatic hypotension AAA repair 8/7 P:  Ct fluids   RENAL Lab Results  Component Value Date   CREATININE 1.25 (H) 02/04/2016   CREATININE 1.32 (H) 02/03/2016   CREATININE 1.52 (H) 02/02/2016   CREATININE 1.1 04/20/2015   CREATININE 1.5 (H) 04/14/2014   CREATININE 1.2 (H) 04/01/2013    A:   AKI post AAA 8/7, resolved NAG acidosis  P:   Bicarb gtt   GASTROINTESTINAL A:   Currently (8-9) NPO post AAA 8/7. Protein calorie malnutrition P:   Sips / chips and advance diet Cortrak & TFs  HEMATOLOGIC  Recent Labs  02/03/16 0401 02/04/16 0435  HGB 8.1* 7.5*    A:   Recent AAA repair 8/7 P:  Follow hgb, target 8 & above  INFECTIOUS A:   Leukocytosis -resolved P:   No abx at this  Monitor for infectious process    ENDOCRINE A:   No acute issue  P:   Follow glucose  NEUROLOGIC A:   Mild confusion post op Hx of TIA earlier in 7/17 P:   RASS goal: 0 Minimize sedation, haldol prn agitation only Sitter   FAMILY  - Updates: none at bedside  - Inter-disciplinary family meet or Palliative Care meeting due by:  8/15   cctime x 47m  Kara Mead MD. FCCP. [facility] Pager [phone] If no response call [phone]    02/04/2016, 8:55 AM

## 2016-02-04 NOTE — Progress Notes (Signed)
Pt cortrak tube reinserted. Xray ordered and added back to assessment. Please contact cortrak team with any questions or concerns.

## 2016-02-05 ENCOUNTER — Inpatient Hospital Stay (HOSPITAL_COMMUNITY): Payer: Medicare Other

## 2016-02-05 DIAGNOSIS — J9690 Respiratory failure, unspecified, unspecified whether with hypoxia or hypercapnia: Secondary | ICD-10-CM

## 2016-02-05 DIAGNOSIS — J9620 Acute and chronic respiratory failure, unspecified whether with hypoxia or hypercapnia: Secondary | ICD-10-CM

## 2016-02-05 LAB — GLUCOSE, CAPILLARY
GLUCOSE-CAPILLARY: 140 mg/dL — AB (ref 65–99)
Glucose-Capillary: 114 mg/dL — ABNORMAL HIGH (ref 65–99)
Glucose-Capillary: 155 mg/dL — ABNORMAL HIGH (ref 65–99)
Glucose-Capillary: 155 mg/dL — ABNORMAL HIGH (ref 65–99)
Glucose-Capillary: 171 mg/dL — ABNORMAL HIGH (ref 65–99)

## 2016-02-05 LAB — BASIC METABOLIC PANEL
ANION GAP: 5 (ref 5–15)
BUN: 24 mg/dL — ABNORMAL HIGH (ref 6–20)
CALCIUM: 7.7 mg/dL — AB (ref 8.9–10.3)
CO2: 25 mmol/L (ref 22–32)
Chloride: 111 mmol/L (ref 101–111)
Creatinine, Ser: 1.2 mg/dL — ABNORMAL HIGH (ref 0.44–1.00)
GFR, EST AFRICAN AMERICAN: 48 mL/min — AB (ref >60)
GFR, EST NON AFRICAN AMERICAN: 42 mL/min — AB (ref >60)
Glucose, Bld: 125 mg/dL — ABNORMAL HIGH (ref 65–99)
Potassium: 4.1 mmol/L (ref 3.5–5.1)
SODIUM: 141 mmol/L (ref 135–145)

## 2016-02-05 LAB — CBC
HCT: 24 % — ABNORMAL LOW (ref 36.0–46.0)
HEMOGLOBIN: 7.8 g/dL — AB (ref 12.0–15.0)
MCH: 31.2 pg (ref 26.0–34.0)
MCHC: 32.5 g/dL (ref 30.0–36.0)
MCV: 96 fL (ref 78.0–100.0)
Platelets: 110 10*3/uL — ABNORMAL LOW (ref 150–400)
RBC: 2.5 MIL/uL — AB (ref 3.87–5.11)
RDW: 14.5 % (ref 11.5–15.5)
WBC: 8.9 10*3/uL (ref 4.0–10.5)

## 2016-02-05 LAB — MAGNESIUM: MAGNESIUM: 2.1 mg/dL (ref 1.7–2.4)

## 2016-02-05 LAB — PHOSPHORUS: PHOSPHORUS: 2.1 mg/dL — AB (ref 2.5–4.6)

## 2016-02-05 LAB — AMMONIA: Ammonia: 29 umol/L (ref 9–35)

## 2016-02-05 MED ORDER — SODIUM PHOSPHATES 45 MMOLE/15ML IV SOLN
10.0000 mmol | Freq: Once | INTRAVENOUS | Status: AC
  Administered 2016-02-05: 10 mmol via INTRAVENOUS
  Filled 2016-02-05: qty 3.33

## 2016-02-05 MED ORDER — FUROSEMIDE 10 MG/ML IJ SOLN
40.0000 mg | Freq: Two times a day (BID) | INTRAMUSCULAR | Status: DC
  Administered 2016-02-05 – 2016-02-06 (×2): 40 mg via INTRAVENOUS
  Filled 2016-02-05 (×2): qty 4

## 2016-02-05 NOTE — Progress Notes (Signed)
  Progress Note    02/05/2016 9:57 AM 5 Days Post-Op  Subjective:  Remains confused this a.m.  Vitals:   02/05/16 0732 02/05/16 0750  BP:    Pulse: (!) 116   Resp: (!) 23   Temp:  98.9 F (37.2 C)    Physical Exam: Cardiac:  tachycardic Lungs:  Coarse bilaterally Incisions:  cdi abdominal incision Extremities:  Palpable dp bilateral Abdomen:  Mild distension  CBC    Component Value Date/Time   WBC 8.9 02/05/2016 0351   RBC 2.50 (L) 02/05/2016 0351   HGB 7.8 (L) 02/05/2016 0351   HGB 15.4 04/20/2015 1514   HCT 24.0 (L) 02/05/2016 0351   HCT 46.6 04/20/2015 1514   PLT 110 (L) 02/05/2016 0351   PLT 207 04/20/2015 1514   MCV 96.0 02/05/2016 0351   MCV 95.6 04/20/2015 1514   MCH 31.2 02/05/2016 0351   MCHC 32.5 02/05/2016 0351   RDW 14.5 02/05/2016 0351   RDW 13.6 04/20/2015 1514   LYMPHSABS 0.3 (L) 02/03/2016 0401   LYMPHSABS 1.8 04/20/2015 1514   MONOABS 0.3 02/03/2016 0401   MONOABS 0.5 04/20/2015 1514   EOSABS 0.0 02/03/2016 0401   EOSABS 0.1 04/20/2015 1514   BASOSABS 0.0 02/03/2016 0401   BASOSABS 0.1 04/20/2015 1514    BMET    Component Value Date/Time   NA 141 02/05/2016 0351   NA 141 04/20/2015 1514   K 4.1 02/05/2016 0351   K 4.1 04/20/2015 1514   CL 111 02/05/2016 0351   CL 105 03/26/2012 1145   CO2 25 02/05/2016 0351   CO2 30 (H) 04/20/2015 1514   GLUCOSE 125 (H) 02/05/2016 0351   GLUCOSE 119 04/20/2015 1514   GLUCOSE 89 03/26/2012 1145   BUN 24 (H) 02/05/2016 0351   BUN 16.1 04/20/2015 1514   CREATININE 1.20 (H) 02/05/2016 0351   CREATININE 1.1 04/20/2015 1514   CALCIUM 7.7 (L) 02/05/2016 0351   CALCIUM 9.6 04/20/2015 1514   GFRNONAA 42 (L) 02/05/2016 0351   GFRAA 48 (L) 02/05/2016 0351    INR    Component Value Date/Time   INR 1.27 02/02/2016 1045     Intake/Output Summary (Last 24 hours) at 02/05/16 0957 Last data filed at 02/05/16 0600  Gross per 24 hour  Intake          1318.33 ml  Output              450 ml  Net            868.33 ml     Assessment:  82 y.o. female is s/p open AAA repair, failure to thrive, on tube feeds, nrb at 40% overnight without desaturation  5 Days Post-Op  Plan: Neuro: remains confused, monitoring without intevention at this time although labs without leukocytosis or uremia, check ammonia level Cv: remains in sinus tach, has prn metoprolol Pulm; guarded status, may need intubated if cannot manage secretions Gi: advancing tube feeds to goal FEN; adequate uop with improving Cr level now 1.2 Ppx: lovenox    Katriel Cutsforth C. Donzetta Matters, MD [facility] Office: [phone] Pager: [phone]  02/05/2016 9:57 AM

## 2016-02-05 NOTE — Progress Notes (Signed)
PULMONARY / CRITICAL CARE MEDICINE   Name: Ruth [name] MRN: [id number] DOB: [birth year]    ADMISSION DATE:  02/14/2016 CONSULTATION DATE:  8/9  REFERRING MD:  Early  CHIEF COMPLAINT:  Post AAA repair 8/7 with decreased urine output and rising creatine 8/9.  HISTORY OF PRESENT ILLNESS:   82 yo 1 ppd smoker x 61 years, recent TIA(7/17) without residuals who was scheduled for AAA repair in July but was postponed due to TIA. She had AAA repair 8/7 with cross clamp time of 40 minutes and extubated in OR with out issue. On 8/9 she has minimal urine output, Hypotension with sitting up along with syncope. Due to COPD , delerium, worsening renal failure PCCM asked to evaluate and assist in her care.    SUBJECTIVE:  Quite weak appearing , weak cough - unable to expectorate    VITAL SIGNS: BP (!) 128/104   Pulse (!) 119   Temp 98.9 F (37.2 C) (Axillary)   Resp (!) 27   Ht 5' (1.524 m)   Wt 107 lb 5.8 oz (48.7 kg)   SpO2 95%   BMI 20.97 kg/m   HEMODYNAMICS:    VENTILATOR SETTINGS: FiO2 (%):  [40 %-50 %] 40 %  INTAKE / OUTPUT: I/O last 3 completed shifts: In: 2613.3 [I.V.:2123.3; NG/GT:490] Out: [id number] [Urine:735]  PHYSICAL EXAMINATION: General: Thin HOH WF in no distress Neuro: Follows commands , mild confusion,   Appears sedated HEENT:  No JVD, No LAN, oral mucosa dry Cardiovascular:  HSR RRR  Lungs:  CTA Abdomen:  Surgical site intact Musculoskeletal:  intact Skin:  Extensive ecchymosis over chest and abd  LABS:  BMET  Recent Labs Lab 02/03/16 0401 02/04/16 0435 02/05/16 0351  NA 137 139 141  K 4.9 4.1 4.1  CL 113* 116* 111  CO2 20* 19* 25  BUN 21* 20 24*  CREATININE 1.32* 1.25* 1.20*  GLUCOSE 135* 175* 125*    Electrolytes  Recent Labs Lab 02/01/16 0337 02/02/16 1045 02/03/16 0401 02/04/16 0435 02/05/16 0351  CALCIUM 7.6* 7.3* 6.9* 7.2* 7.7*  MG 2.4 2.3  --   --  2.1  PHOS  --  2.4*  --   --  2.1*    CBC  Recent Labs Lab 02/03/16 0401  02/04/16 0435 02/05/16 0351  WBC 10.5 8.1 8.9  HGB 8.1* 7.5* 7.8*  HCT 24.4* 23.1* 24.0*  PLT 99* 102* 110*    Coag's  Recent Labs Lab 02/13/2016 1400 02/02/16 1045  APTT 28 47*  INR 1.27 1.27    Sepsis Markers No results for input(s): LATICACIDVEN, PROCALCITON, O2SATVEN in the last 168 hours.  ABG  Recent Labs Lab 02/02/16 1409 02/04/16 0423 02/04/16 1950  PHART 7.368 7.314* 7.320*  PCO2ART 30.6* 37.4 42.2  PO2ART 62.0* 73.1* 90.0    Liver Enzymes  Recent Labs Lab 02/01/16 0337 02/03/16 0401  AST 35 32  ALT 22 24  ALKPHOS 83 124  BILITOT 0.6 0.8  ALBUMIN 2.4* 1.7*    Cardiac Enzymes No results for input(s): TROPONINI, PROBNP in the last 168 hours.  Glucose  Recent Labs Lab 02/04/16 1221 02/04/16 1616 02/04/16 2018 02/04/16 2340 02/05/16 0345 02/05/16 0746  GLUCAP 95 137* 77 154* 114* 155*    Imaging Dg Abd Portable 1v  Result Date: 02/04/2016 CLINICAL DATA:  NG tube placement EXAM: PORTABLE ABDOMEN - 1 VIEW COMPARISON:  02/03/2016 FINDINGS: There is normal small bowel gas pattern. There is NG feeding tube with tip in mid duodenum. IMPRESSION: NG feeding  tube with tip in mid duodenum. Electronically Signed   By: Lahoma Crocker M.D.   On: 02/04/2016 14:37     STUDIES:    CULTURES: none  ANTIBIOTICS: None  SIGNIFICANT EVENTS: 8/7 AAA repair  LINES/TUBES: 8/7 rt I j cordis>> 8/7 ett>>8/7 8/7 aline>>8/8  DISCUSSION:  Delerium persists but slight better AKI improving, NAG acidosis  ASSESSMENT / PLAN:  PULMONARY A: Severe COPD, smoker  FEV1 (L) *0.97 FEV1/FVC (%) *55  P:   Minimise O2 as needed  Pulmonary toilet Duonebs  CARDIOVASCULAR A:  Syncope  Hypertensive hx Orthostatic hypotension AAA repair 8/7 P:  Ct fluids   RENAL Lab Results  Component Value Date   CREATININE 1.20 (H) 02/05/2016   CREATININE 1.25 (H) 02/04/2016   CREATININE 1.32 (H) 02/03/2016   CREATININE 1.1 04/20/2015   CREATININE 1.5 (H)  04/14/2014   CREATININE 1.2 (H) 04/01/2013    A:   AKI post AAA 8/7, resolved NAG acidosis  P:   Bicarb gtt co2 25 consider dc bicarb 8/12   GASTROINTESTINAL A:   NPO post AAA 8/7. Protein calorie malnutrition P:   Sips / chips and advance diet Cortrak & TFs  HEMATOLOGIC  Recent Labs  02/04/16 0435 02/05/16 0351  HGB 7.5* 7.8*    A:   Recent AAA repair 8/7 P:  Follow hgb, target 8 & above  INFECTIOUS A:   Leukocytosis -resolved P:   No abx at this  Monitor for infectious process    ENDOCRINE CBG (last 3)   Recent Labs  02/04/16 2340 02/05/16 0345 02/05/16 0746  GLUCAP 154* 114* 155*     A:   No acute issue  P:   Follow glucose  NEUROLOGIC A:   Mild confusion post op Hx of TIA earlier in 7/17 P:   RASS goal: 0 Minimize sedation, haldol prn agitation only Sitter   FAMILY  - Updates: none at bedside  - Inter-disciplinary family meet or Palliative Care meeting due by:  8/15   Richardson Landry Jamarl Pew ACNP [facility] PCCM Pager [phone] till 3 pm If no answer page [phone] 02/05/2016, 10:46 AM

## 2016-02-06 ENCOUNTER — Inpatient Hospital Stay (HOSPITAL_COMMUNITY): Payer: Medicare Other

## 2016-02-06 DIAGNOSIS — J9601 Acute respiratory failure with hypoxia: Secondary | ICD-10-CM

## 2016-02-06 LAB — GLUCOSE, CAPILLARY
GLUCOSE-CAPILLARY: 118 mg/dL — AB (ref 65–99)
GLUCOSE-CAPILLARY: 125 mg/dL — AB (ref 65–99)
GLUCOSE-CAPILLARY: 170 mg/dL — AB (ref 65–99)
Glucose-Capillary: 134 mg/dL — ABNORMAL HIGH (ref 65–99)
Glucose-Capillary: 141 mg/dL — ABNORMAL HIGH (ref 65–99)
Glucose-Capillary: 151 mg/dL — ABNORMAL HIGH (ref 65–99)
Glucose-Capillary: 99 mg/dL (ref 65–99)

## 2016-02-06 LAB — PHOSPHORUS: Phosphorus: 3.1 mg/dL (ref 2.5–4.6)

## 2016-02-06 LAB — BLOOD GAS, ARTERIAL
Acid-Base Excess: 4.4 mmol/L — ABNORMAL HIGH (ref 0.0–2.0)
BICARBONATE: 29 meq/L — AB (ref 20.0–24.0)
Drawn by: 28340
FIO2: 40
O2 SAT: 94.2 %
PO2 ART: 71.5 mmHg — AB (ref 80.0–100.0)
Patient temperature: 98.6
TCO2: 30.4 mmol/L (ref 0–100)
pCO2 arterial: 47.6 mmHg — ABNORMAL HIGH (ref 35.0–45.0)
pH, Arterial: 7.401 (ref 7.350–7.450)

## 2016-02-06 LAB — BASIC METABOLIC PANEL
Anion gap: 9 (ref 5–15)
BUN: 30 mg/dL — ABNORMAL HIGH (ref 6–20)
CALCIUM: 7.7 mg/dL — AB (ref 8.9–10.3)
CO2: 30 mmol/L (ref 22–32)
CREATININE: 1.24 mg/dL — AB (ref 0.44–1.00)
Chloride: 105 mmol/L (ref 101–111)
GFR calc Af Amer: 46 mL/min — ABNORMAL LOW (ref >60)
GFR calc non Af Amer: 40 mL/min — ABNORMAL LOW (ref >60)
GLUCOSE: 120 mg/dL — AB (ref 65–99)
Potassium: 3.4 mmol/L — ABNORMAL LOW (ref 3.5–5.1)
Sodium: 144 mmol/L (ref 135–145)

## 2016-02-06 LAB — TYPE AND SCREEN
ABO/RH(D): B POS
Antibody Screen: NEGATIVE
UNIT DIVISION: 0

## 2016-02-06 LAB — POCT I-STAT 3, ART BLOOD GAS (G3+)
Acid-Base Excess: 4 mmol/L — ABNORMAL HIGH (ref 0.0–2.0)
BICARBONATE: 30.6 meq/L — AB (ref 20.0–24.0)
O2 Saturation: 100 %
PCO2 ART: 57 mmHg — AB (ref 35.0–45.0)
Patient temperature: 98.4
TCO2: 32 mmol/L (ref 0–100)
pH, Arterial: 7.338 — ABNORMAL LOW (ref 7.350–7.450)
pO2, Arterial: 256 mmHg — ABNORMAL HIGH (ref 80.0–100.0)

## 2016-02-06 LAB — MAGNESIUM: Magnesium: 1.8 mg/dL (ref 1.7–2.4)

## 2016-02-06 LAB — CBC
HCT: 24.1 % — ABNORMAL LOW (ref 36.0–46.0)
HEMATOCRIT: 31.5 % — AB (ref 36.0–46.0)
HEMOGLOBIN: 7.7 g/dL — AB (ref 12.0–15.0)
HEMOGLOBIN: 10.4 g/dL — AB (ref 12.0–15.0)
MCH: 30.8 pg (ref 26.0–34.0)
MCH: 30.4 pg (ref 26.0–34.0)
MCHC: 32 g/dL (ref 30.0–36.0)
MCHC: 33 g/dL (ref 30.0–36.0)
MCV: 96.4 fL (ref 78.0–100.0)
MCV: 92.1 fL (ref 78.0–100.0)
PLATELETS: 94 10*3/uL — AB (ref 150–400)
Platelets: 89 10*3/uL — ABNORMAL LOW (ref 150–400)
RBC: 2.5 MIL/uL — ABNORMAL LOW (ref 3.87–5.11)
RBC: 3.42 MIL/uL — AB (ref 3.87–5.11)
RDW: 14.5 % (ref 11.5–15.5)
RDW: 15.4 % (ref 11.5–15.5)
WBC: 14.3 10*3/uL — ABNORMAL HIGH (ref 4.0–10.5)
WBC: 19.4 10*3/uL — AB (ref 4.0–10.5)

## 2016-02-06 LAB — TROPONIN I
TROPONIN I: 0.04 ng/mL — AB (ref <0.03)
TROPONIN I: 0.03 ng/mL — AB (ref <0.03)
Troponin I: 0.05 ng/mL (ref <0.03)

## 2016-02-06 LAB — PREPARE RBC (CROSSMATCH)

## 2016-02-06 MED ORDER — SODIUM CHLORIDE 0.9 % IV SOLN: Freq: Once | INTRAVENOUS | Status: AC

## 2016-02-06 MED ORDER — FENTANYL CITRATE (PF) 100 MCG/2ML IJ SOLN
25.0000 ug | INTRAMUSCULAR | Status: DC | PRN
  Administered 2016-02-06 – 2016-02-07 (×10): 100 ug via INTRAVENOUS
  Filled 2016-02-06 (×10): qty 2

## 2016-02-06 MED ORDER — CHLORHEXIDINE GLUCONATE 0.12% ORAL RINSE (MEDLINE KIT)
15.0000 mL | Freq: Two times a day (BID) | OROMUCOSAL | Status: DC
  Administered 2016-02-06 – 2016-02-07 (×3): 15 mL via OROMUCOSAL

## 2016-02-06 MED ORDER — ANTISEPTIC ORAL RINSE SOLUTION (CORINZ)
7.0000 mL | OROMUCOSAL | Status: DC
  Administered 2016-02-06 – 2016-02-07 (×15): 7 mL via OROMUCOSAL

## 2016-02-06 MED ORDER — SODIUM CHLORIDE 0.9 % IV SOLN: INTRAVENOUS | Status: DC

## 2016-02-06 MED ORDER — MIDAZOLAM HCL 2 MG/2ML IJ SOLN
1.0000 mg | INTRAMUSCULAR | Status: DC | PRN
  Administered 2016-02-06 – 2016-02-07 (×5): 1 mg via INTRAVENOUS
  Filled 2016-02-06 (×5): qty 2

## 2016-02-06 MED ORDER — IPRATROPIUM-ALBUTEROL 0.5-2.5 (3) MG/3ML IN SOLN
3.0000 mL | Freq: Four times a day (QID) | RESPIRATORY_TRACT | Status: DC
  Administered 2016-02-06 – 2016-02-07 (×5): 3 mL via RESPIRATORY_TRACT
  Filled 2016-02-06 (×6): qty 3

## 2016-02-06 MED ORDER — POTASSIUM CHLORIDE 10 MEQ/50ML IV SOLN
10.0000 meq | INTRAVENOUS | Status: AC
  Administered 2016-02-06 (×2): 10 meq via INTRAVENOUS
  Filled 2016-02-06 (×2): qty 50

## 2016-02-06 MED ORDER — SODIUM CHLORIDE 0.9 % IV BOLUS (SEPSIS)
500.0000 mL | Freq: Once | INTRAVENOUS | Status: AC
  Administered 2016-02-06: 500 mL via INTRAVENOUS

## 2016-02-06 MED ORDER — NOREPINEPHRINE BITARTRATE 1 MG/ML IV SOLN
2.0000 ug/min | INTRAVENOUS | Status: DC
  Administered 2016-02-06: 20 ug/min via INTRAVENOUS
  Administered 2016-02-06: 22 ug/min via INTRAVENOUS
  Administered 2016-02-06 – 2016-02-07 (×2): 8 ug/min via INTRAVENOUS
  Administered 2016-02-07 (×2): 50 ug/min via INTRAVENOUS
  Administered 2016-02-07: 9 ug/min via INTRAVENOUS
  Administered 2016-02-07: 25 ug/min via INTRAVENOUS
  Administered 2016-02-07: 30 ug/min via INTRAVENOUS
  Administered 2016-02-07: 35 ug/min via INTRAVENOUS
  Filled 2016-02-06 (×9): qty 4

## 2016-02-06 MED ORDER — FENTANYL CITRATE (PF) 100 MCG/2ML IJ SOLN: 50.0000 ug | INTRAMUSCULAR | Status: DC | PRN

## 2016-02-06 NOTE — Progress Notes (Signed)
   02/06/16 0700  Clinical Encounter Type  Visited With Patient not available  Visit Type Code  Referral From Nurse  Chaplain responded to paged Code Blue (brick not working). Patient somewhat stable, family not present. Will return when family arrives. Milbert Bixler, Chaplain

## 2016-02-06 NOTE — Progress Notes (Signed)
  Progress Note    02/06/2016 1:15 PM 6 Days Post-Op  Subjective:  intubated  Vitals:   02/06/16 1245 02/06/16 1300  BP:    Pulse: 91 90  Resp: 17 17  Temp:      Physical Exam: Cardiac:  rrr Lungs:  Coarse bilaterally Incisions:  cdi abdominal incision Extremities:  Palpable dp bilateral Abdomen:  Mild distension  CBC    Component Value Date/Time   WBC 14.3 (H) 02/06/2016 0355   RBC 2.50 (L) 02/06/2016 0355   HGB 7.7 (L) 02/06/2016 0355   HGB 15.4 04/20/2015 1514   HCT 24.1 (L) 02/06/2016 0355   HCT 46.6 04/20/2015 1514   PLT 94 (L) 02/06/2016 0355   PLT 207 04/20/2015 1514   MCV 96.4 02/06/2016 0355   MCV 95.6 04/20/2015 1514   MCH 30.8 02/06/2016 0355   MCHC 32.0 02/06/2016 0355   RDW 14.5 02/06/2016 0355   RDW 13.6 04/20/2015 1514   LYMPHSABS 0.3 (L) 02/03/2016 0401   LYMPHSABS 1.8 04/20/2015 1514   MONOABS 0.3 02/03/2016 0401   MONOABS 0.5 04/20/2015 1514   EOSABS 0.0 02/03/2016 0401   EOSABS 0.1 04/20/2015 1514   BASOSABS 0.0 02/03/2016 0401   BASOSABS 0.1 04/20/2015 1514    BMET    Component Value Date/Time   NA 144 02/06/2016 0355   NA 141 04/20/2015 1514   K 3.4 (L) 02/06/2016 0355   K 4.1 04/20/2015 1514   CL 105 02/06/2016 0355   CL 105 03/26/2012 1145   CO2 30 02/06/2016 0355   CO2 30 (H) 04/20/2015 1514   GLUCOSE 120 (H) 02/06/2016 0355   GLUCOSE 119 04/20/2015 1514   GLUCOSE 89 03/26/2012 1145   BUN 30 (H) 02/06/2016 0355   BUN 16.1 04/20/2015 1514   CREATININE 1.24 (H) 02/06/2016 0355   CREATININE 1.1 04/20/2015 1514   CALCIUM 7.7 (L) 02/06/2016 0355   CALCIUM 9.6 04/20/2015 1514   GFRNONAA 40 (L) 02/06/2016 0355   GFRAA 46 (L) 02/06/2016 0355    INR    Component Value Date/Time   INR 1.27 02/02/2016 1045     Intake/Output Summary (Last 24 hours) at 02/06/16 1315 Last data filed at 02/06/16 1300  Gross per 24 hour  Intake          1515.03 ml  Output             1855 ml  Net          -339.97 ml     Assessment:  82  y.o. female is s/p open AAA repair, failure to thrive, intubated for near respiratory arrest this a.m. 6 Days Post-Op  Plan: Neuro: holding further sedatives for neuro status Cv: on levophed for bp, hr improved s/p intubation Pulm; intubated this a.m., discussion for trach this wek Gi: tube feeds when off pressors FEN; Cr stable Ppx: lovenox    Brandon C. Donzetta Matters, MD [facility] Office: [phone] Pager: [phone]  02/06/2016 1:15 PM

## 2016-02-06 NOTE — Procedures (Signed)
Intubation Procedure Note Ruth [name] [id number] May 29, 1933  Procedure: Intubation Indications: Respiratory insufficiency  Procedure Details Consent: Unable to obtain consent because of emergent medical necessity. Time Out: Verified patient identification, verified procedure, site/side was marked, verified correct patient position, special equipment/implants available, medications/allergies/relevent history reviewed, required imaging and test results available.  Performed  Maximum sterile technique was used including gloves.  MAC and 4    Evaluation Hemodynamic Status: Transient hypotension treated with pressors; O2 sats: stable throughout Patient's Current Condition: stable Complications: No apparent complications Patient did tolerate procedure well. Chest X-ray ordered to verify placement.  CXR: pending.   Ruth [name] 02/06/2016

## 2016-02-06 NOTE — Significant Event (Signed)
During shift report, patient began to desat into the 72s. Pt unresponsive with no gag reflex upon suctioning. Strong femoral pulses palpated. Patient on 55% venti mask. Sats continued to drop and then patient developed bradycardia with rate in the 30s. BVM performed with gradual improvement of patients sats and HR. RT, Dr. Halford Chessman and Richardson Landry Minor, NP, at the bedside at this point. Pt became very hypotensive and Levophed gtt ordered and started.  Dr. Donzetta Matters, VVS MD, also at bedside and is to update family on patients condition.  CXR ordered and plan for aline to be placed. Continuing to watch pt very closely at this time.   Ruth [name]

## 2016-02-06 NOTE — Procedures (Signed)
Arterial Catheter Insertion Procedure Note Ruth [name] 12-27-1932  Procedure: Insertion of Arterial Catheter  Indications: Blood pressure monitoring and Frequent blood sampling  Procedure Details Consent: Unable to obtain consent because of altered level of consciousness. Time Out: Verified patient identification, verified procedure, site/side was marked, verified correct patient position, special equipment/implants available, medications/allergies/relevent history reviewed, required imaging and test results available.  Performed  Maximum sterile technique was used including antiseptics, cap, gloves, gown, hand hygiene, mask and sheet. Skin prep: Chlorhexidine; local anesthetic administered 20 gauge catheter was inserted into right femoral artery using the Seldinger technique.  Evaluation Blood flow good; BP tracing good. Complications: No apparent complications.   Ruth [name] PCCM Pager [phone] till 3 pm If no answer page [phone] 02/06/2016, 8:24 AM

## 2016-02-06 NOTE — Progress Notes (Signed)
PULMONARY / CRITICAL CARE MEDICINE   Name: Ruth [name] MRN: [id number] DOB: [birth year]    ADMISSION DATE:  02/19/2016 CONSULTATION DATE:  8/9  REFERRING MD:  Early  CHIEF COMPLAINT:  Post AAA repair 8/7 with decreased urine output and rising creatine 8/9.  HISTORY OF PRESENT ILLNESS:   82 yo 1 ppd smoker x 61 years, recent TIA(7/17) without residuals who was scheduled for AAA repair in July but was postponed due to TIA. She had AAA repair 8/7 with cross clamp time of 40 minutes and extubated in OR with out issue. On 8/9 she has minimal urine output, Hypotension with sitting up along with syncope. Due to COPD , delerium, worsening renal failure PCCM asked to evaluate and assist in her care.   SUBJECTIVE:  Bradycardia, respiratory distress this AM.  Developed loss of consciousness and emergently intubated.  Hypotensive.  VITAL SIGNS: BP (!) 178/76   Pulse (!) 113   Temp 98.4 F (36.9 C) (Oral)   Resp (!) 32   Ht 5' (1.524 m)   Wt 110 lb 3.7 oz (50 kg)   SpO2 100%   BMI 21.53 kg/m   HEMODYNAMICS:    VENTILATOR SETTINGS: Vent Mode: PRVC FiO2 (%):  [40 %-100 %] 100 % Set Rate:  [16 bmp] 16 bmp Vt Set:  [370 mL] 370 mL PEEP:  [5 cmH20] 5 cmH20 Plateau Pressure:  [15 cmH20] 15 cmH20  INTAKE / OUTPUT: I/O last 3 completed shifts: In: 2400 [I.V.:1200; NG/GT:800; IV Piggyback:400] Out: 2010 [Urine:2010]  PHYSICAL EXAMINATION: General: ill appearing Neuro: unresponsive HEENT: ETT in place, pupils reactive Cardiac: regular, no murmur Chest: b/l rhonchi Abd: wound site clean, mild distention Ext: no edema Skin: multiple areas of ecchymosis on chest and abdomen  LABS:  BMET  Recent Labs Lab 02/04/16 0435 02/05/16 0351 02/06/16 0355  NA 139 141 144  K 4.1 4.1 3.4*  CL 116* 111 105  CO2 19* 25 30  BUN 20 24* 30*  CREATININE 1.25* 1.20* 1.24*  GLUCOSE 175* 125* 120*    Electrolytes  Recent Labs Lab 02/02/16 1045  02/04/16 0435 02/05/16 0351 02/06/16 0355   CALCIUM 7.3*  < > 7.2* 7.7* 7.7*  MG 2.3  --   --  2.1 1.8  PHOS 2.4*  --   --  2.1* 3.1  < > = values in this interval not displayed.  CBC  Recent Labs Lab 02/04/16 0435 02/05/16 0351 02/06/16 0355  WBC 8.1 8.9 14.3*  HGB 7.5* 7.8* 7.7*  HCT 23.1* 24.0* 24.1*  PLT 102* 110* 94*    Coag's  Recent Labs Lab 02/11/2016 1400 02/02/16 1045  APTT 28 47*  INR 1.27 1.27    Sepsis Markers No results for input(s): LATICACIDVEN, PROCALCITON, O2SATVEN in the last 168 hours.  ABG  Recent Labs Lab 02/04/16 0423 02/04/16 1950 02/06/16 0400  PHART 7.314* 7.320* 7.401  PCO2ART 37.4 42.2 47.6*  PO2ART 73.1* 90.0 71.5*    Liver Enzymes  Recent Labs Lab 02/01/16 0337 02/03/16 0401  AST 35 32  ALT 22 24  ALKPHOS 83 124  BILITOT 0.6 0.8  ALBUMIN 2.4* 1.7*    Cardiac Enzymes No results for input(s): TROPONINI, PROBNP in the last 168 hours.  Glucose  Recent Labs Lab 02/05/16 0746 02/05/16 1135 02/05/16 1605 02/05/16 2003 02/06/16 0027 02/06/16 0418  GLUCAP 155* 155* 140* 171* 125* 118*    Imaging Dg Chest Port 1 View  Result Date: 02/05/2016 CLINICAL DATA:  Weakness.  Cough.  Hypoxia.  Breast cancer. EXAM: PORTABLE CHEST 1 VIEW COMPARISON:  Chest radiograph from one day prior. FINDINGS: Right PICC terminates in the middle third of the superior vena cava. Right internal jugular central venous sheath is stable with the tip in the upper third of the superior vena cava. Enteric tube enters stomach with the tip not seen on this image. Surgical clip overlies the left axilla. Stable cardiomediastinal silhouette with normal heart size. No pneumothorax. Stable small bilateral pleural effusions, left greater than right. No overt pulmonary edema. Patchy opacities at the left greater than right lung bases are stable. IMPRESSION: 1. Stable patchy bibasilar lung opacities, left greater than right, which could represent atelectasis, aspiration and/or pneumonia. 2. Stable small  bilateral pleural effusions, left greater than right. Electronically Signed   By: Ilona Sorrel M.D.   On: 02/05/2016 11:18     STUDIES:    CULTURES: none  ANTIBIOTICS: None  SIGNIFICANT EVENTS: 8/7 AAA repair 8/9 respiratory arrest leading to bradycardia/hypotension >> VDRF  LINES/TUBES: 8/7 rt I j cordis >> 8/11 8/7 ett >> 8/7 8/7 aline >> 8/8 8/11 Rt PICC >>  8/13 ETT >>  DISCUSSION: 82 yo female s/p AAA repair.  She has hx of severe COPD and nocturnal hypoxemia.  Developed respiratory arrest leading to bradycardia and altered mental status 8/13, requiring emergent intubation.  ASSESSMENT / PLAN:  PULMONARY A: Acute hypoxic respiratory failure. Severe COPD, smoker  >>FEV1 (L) *0.97 FEV1/FVC (%) *55. P:   Full vent support F/u CXR, ABG Scheduled BDs  CARDIOVASCULAR A:  Bradycardia, hypotension in setting of respiratory arrest 8/13. Syncope. Hx of HTN. Orthostatic hypotension. AAA repair 8/7. P:  Continue IV fluids Pressors to keep MAP > 65 Place arterial line Check Echo, cardiac enzymes  RENAL A:   AKI post AAA 8/7, resolved. NAG acidosis >> resolved. Hypokalemia. P:   Replace potassium  GASTROINTESTINAL A:   Abdominal distention after intubation 8/13. Protein calorie malnutrition. Constipation. P:   Tube feeds Pepcid for SUP Bowel regimen  HEMATOLOGIC A:   Anemia of critical illness and chronic disease. P:  Hb 7.7 >> transfuse 1 unit PRBC 8/13 in setting of respiratory/bradycardic arrest SCDs  INFECTIOUS A:   Leukocytosis. P:   Monitor clinically  ENDOCRINE A:   Hyperglycemia. P:   SSI  NEUROLOGIC A:   Acute metabolic encephalopathy. Hx of TIA July 2017. P:   RASS goal: 0 D/c haldol If mental status doesn't improve after optimizing oxygenation and hemodynamics, then will need CT head  CC time 38 minutes.  Chesley Mires, MD [facility] Pulmonary/Critical Care 02/06/2016, 7:35 AM Pager:  [phone] After 3pm call:  [phone]

## 2016-02-07 ENCOUNTER — Inpatient Hospital Stay (HOSPITAL_COMMUNITY): Payer: Medicare Other

## 2016-02-07 DIAGNOSIS — J189 Pneumonia, unspecified organism: Secondary | ICD-10-CM

## 2016-02-07 DIAGNOSIS — R0600 Dyspnea, unspecified: Secondary | ICD-10-CM

## 2016-02-07 DIAGNOSIS — M7989 Other specified soft tissue disorders: Secondary | ICD-10-CM

## 2016-02-07 LAB — POCT I-STAT 3, ART BLOOD GAS (G3+)
ACID-BASE DEFICIT: 4 mmol/L — AB (ref 0.0–2.0)
Acid-Base Excess: 2 mmol/L (ref 0.0–2.0)
BICARBONATE: 27.2 meq/L — AB (ref 20.0–24.0)
Bicarbonate: 20.9 mEq/L (ref 20.0–24.0)
O2 Saturation: 89 %
O2 Saturation: 85 %
PCO2 ART: 43.2 mmHg (ref 35.0–45.0)
PH ART: 7.406 (ref 7.350–7.450)
PH ART: 7.357 (ref 7.350–7.450)
PO2 ART: 56 mmHg — AB (ref 80.0–100.0)
Patient temperature: 98.1
TCO2: 28 mmol/L (ref 0–100)
TCO2: 22 mmol/L (ref 0–100)
pCO2 arterial: 37 mmHg (ref 35.0–45.0)
pO2, Arterial: 50 mmHg — ABNORMAL LOW (ref 80.0–100.0)

## 2016-02-07 LAB — BASIC METABOLIC PANEL
ANION GAP: 7 (ref 5–15)
ANION GAP: 9 (ref 5–15)
BUN: 39 mg/dL — ABNORMAL HIGH (ref 6–20)
BUN: 47 mg/dL — ABNORMAL HIGH (ref 6–20)
CALCIUM: 7.4 mg/dL — AB (ref 8.9–10.3)
CHLORIDE: 107 mmol/L (ref 101–111)
CO2: 27 mmol/L (ref 22–32)
CO2: 21 mmol/L — AB (ref 22–32)
Calcium: 7.5 mg/dL — ABNORMAL LOW (ref 8.9–10.3)
Chloride: 106 mmol/L (ref 101–111)
Creatinine, Ser: 1.43 mg/dL — ABNORMAL HIGH (ref 0.44–1.00)
Creatinine, Ser: 1.98 mg/dL — ABNORMAL HIGH (ref 0.44–1.00)
GFR calc Af Amer: 39 mL/min — ABNORMAL LOW (ref >60)
GFR, EST AFRICAN AMERICAN: 26 mL/min — AB (ref >60)
GFR, EST NON AFRICAN AMERICAN: 34 mL/min — AB (ref >60)
GFR, EST NON AFRICAN AMERICAN: 23 mL/min — AB (ref >60)
GLUCOSE: 145 mg/dL — AB (ref 65–99)
Glucose, Bld: 143 mg/dL — ABNORMAL HIGH (ref 65–99)
POTASSIUM: 3.5 mmol/L (ref 3.5–5.1)
POTASSIUM: 4.4 mmol/L (ref 3.5–5.1)
SODIUM: 141 mmol/L (ref 135–145)
Sodium: 136 mmol/L (ref 135–145)

## 2016-02-07 LAB — ECHOCARDIOGRAM COMPLETE
CHL CUP MV DEC (S): 193
CHL CUP RV SYS PRESS: 48 mmHg
CHL CUP TV REG PEAK VELOCITY: 337 cm/s
E decel time: 193 msec
FS: 46 % — AB (ref 28–44)
HEIGHTINCHES: 60 in
IVS/LV PW RATIO, ED: 0.89
LA ID, A-P, ES: 27 mm
LA diam end sys: 27 mm
LA vol index: 9.4 mL/m2
LA vol: 13.5 mL
LADIAMINDEX: 1.88 cm/m2
LAVOLA4C: 10.8 mL
LDCA: 2.84 cm2
LV PW d: 9 mm — AB (ref 0.6–1.1)
LV TDI E'LATERAL: 8.92
LV TDI E'MEDIAL: 5.33
LV e' LATERAL: 8.92 cm/s
LVOT diameter: 19 mm
MV pk E vel: 0.7 m/s
TR max vel: 337 cm/s
WEIGHTICAEL: 1728.41 [oz_av]

## 2016-02-07 LAB — URINALYSIS, ROUTINE W REFLEX MICROSCOPIC
GLUCOSE, UA: NEGATIVE mg/dL
Ketones, ur: NEGATIVE mg/dL
Nitrite: NEGATIVE
PROTEIN: 100 mg/dL — AB
SPECIFIC GRAVITY, URINE: 1.017 (ref 1.005–1.030)
pH: 6 (ref 5.0–8.0)

## 2016-02-07 LAB — GLUCOSE, CAPILLARY
GLUCOSE-CAPILLARY: 101 mg/dL — AB (ref 65–99)
Glucose-Capillary: 127 mg/dL — ABNORMAL HIGH (ref 65–99)
Glucose-Capillary: 129 mg/dL — ABNORMAL HIGH (ref 65–99)
Glucose-Capillary: 106 mg/dL — ABNORMAL HIGH (ref 65–99)
Glucose-Capillary: 238 mg/dL — ABNORMAL HIGH (ref 65–99)

## 2016-02-07 LAB — URINE MICROSCOPIC-ADD ON

## 2016-02-07 LAB — CBC
HEMATOCRIT: 33.3 % — AB (ref 36.0–46.0)
HEMOGLOBIN: 10.9 g/dL — AB (ref 12.0–15.0)
MCH: 29.9 pg (ref 26.0–34.0)
MCHC: 32.7 g/dL (ref 30.0–36.0)
MCV: 91.2 fL (ref 78.0–100.0)
Platelets: 85 10*3/uL — ABNORMAL LOW (ref 150–400)
RBC: 3.65 MIL/uL — AB (ref 3.87–5.11)
RDW: 16 % — ABNORMAL HIGH (ref 11.5–15.5)
WBC: 25.9 10*3/uL — AB (ref 4.0–10.5)

## 2016-02-07 LAB — PHOSPHORUS: PHOSPHORUS: 2.8 mg/dL (ref 2.5–4.6)

## 2016-02-07 LAB — CORTISOL

## 2016-02-07 LAB — TYPE AND SCREEN
ABO/RH(D): B POS
Antibody Screen: NEGATIVE
Unit division: 0

## 2016-02-07 LAB — HEMOGLOBIN AND HEMATOCRIT, BLOOD
HEMATOCRIT: 36.8 % (ref 36.0–46.0)
HEMOGLOBIN: 11.9 g/dL — AB (ref 12.0–15.0)

## 2016-02-07 LAB — CARBOXYHEMOGLOBIN
Carboxyhemoglobin: 0.6 % (ref 0.5–1.5)
Methemoglobin: 1.1 % (ref 0.0–1.5)
O2 Saturation: 48.1 %
Total hemoglobin: 9.3 g/dL — ABNORMAL LOW (ref 12.0–16.0)

## 2016-02-07 LAB — MAGNESIUM
Magnesium: 1.8 mg/dL (ref 1.7–2.4)
Magnesium: 1.6 mg/dL — ABNORMAL LOW (ref 1.7–2.4)

## 2016-02-07 LAB — TROPONIN I
Troponin I: 0.04 ng/mL (ref <0.03)
Troponin I: 0.03 ng/mL (ref <0.03)

## 2016-02-07 MED ORDER — SODIUM CHLORIDE 0.9 % IV SOLN
0.0400 [IU]/min | INTRAVENOUS | Status: DC
Start: 2016-02-07 — End: 2016-02-08
  Administered 2016-02-07: 0.04 [IU]/min via INTRAVENOUS
  Filled 2016-02-07: qty 2

## 2016-02-07 MED ORDER — ATROPINE SULFATE 1 % OP SOLN
2.0000 [drp] | Freq: Three times a day (TID) | OPHTHALMIC | Status: DC | PRN
  Filled 2016-02-07: qty 2

## 2016-02-07 MED ORDER — HYDROCORTISONE NA SUCCINATE PF 100 MG IJ SOLR
50.0000 mg | Freq: Once | INTRAMUSCULAR | Status: AC
  Administered 2016-02-07: 50 mg via INTRAVENOUS
  Filled 2016-02-07: qty 2

## 2016-02-07 MED ORDER — DEXTROSE 5 % IV SOLN
2.0000 g | INTRAVENOUS | Status: DC
  Administered 2016-02-07: 2 g via INTRAVENOUS
  Filled 2016-02-07 (×2): qty 2

## 2016-02-07 MED ORDER — DOPAMINE-DEXTROSE 3.2-5 MG/ML-% IV SOLN
0.0000 ug/kg/min | INTRAVENOUS | Status: DC
  Administered 2016-02-07: 5 ug/kg/min via INTRAVENOUS
  Filled 2016-02-07: qty 250

## 2016-02-07 MED ORDER — POTASSIUM CHLORIDE 20 MEQ/15ML (10%) PO SOLN
20.0000 meq | Freq: Once | ORAL | Status: AC
Start: 2016-02-07 — End: 2016-02-07
  Administered 2016-02-07: 20 meq via ORAL
  Filled 2016-02-07: qty 15

## 2016-02-07 MED ORDER — MORPHINE SULFATE 25 MG/ML IV SOLN
10.0000 mg/h | INTRAVENOUS | Status: DC
  Administered 2016-02-07: 10 mg/h via INTRAVENOUS
  Filled 2016-02-07: qty 10

## 2016-02-07 MED ORDER — MORPHINE BOLUS VIA INFUSION
5.0000 mg | INTRAVENOUS | Status: DC | PRN
  Filled 2016-02-07: qty 20

## 2016-02-07 MED ORDER — PHENYLEPHRINE HCL 10 MG/ML IJ SOLN
0.0000 ug/min | INTRAMUSCULAR | Status: DC
  Administered 2016-02-07 (×2): 400 ug/min via INTRAVENOUS
  Administered 2016-02-07: 25 ug/min via INTRAVENOUS
  Filled 2016-02-07 (×3): qty 4

## 2016-02-07 MED ORDER — MAGNESIUM SULFATE 2 GM/50ML IV SOLN
2.0000 g | Freq: Once | INTRAVENOUS | Status: AC
  Administered 2016-02-07: 2 g via INTRAVENOUS
  Filled 2016-02-07: qty 50

## 2016-02-07 NOTE — Progress Notes (Signed)
Patient ID: Ruth [name], female   DOB: 13-Jun-1933, 82 y.o.   MRN: [id number] Likely no change. Does not open eyes to voice on vent but does move all 4 in restraints. CT of head shows small vessel disease but no bleed Right arm duplex does show partial occlusive DVT in her right axillary and subclavian vein undoubtedly related to the PICC line. Certainly would be risky for full anticoagulation due to her current state. Low risk for PE associated with this. Needs the access from her PICC line. Will watch clinically

## 2016-02-07 NOTE — Progress Notes (Signed)
[facility] Progress Note Patient Name: Ruth [name] DOB: Mar 06, 1933 MRN: [id number]   Date of Service  02/07/2016  HPI/Events of Note  Lengthy discussion with the patient's husband and other family members at bedside via [place]. Explained that her shock continues to worsen and despite multiple vasopressor agents we have been unable to maintain a perfusing blood pressure. With persistent hypotension and further clinical decline despite aggressive support family wishes to transition to full comfort care at this time. Patient does have a DO NOT RESUSCITATE from home and family have brought in as well. I offered a hospital chaplain for spiritual support but has been declined.   eICU Interventions  Transitioning to full comfort care with withdrawal order set. Plan for terminal extubation. Morphine infusion for comfort as well as sublingual atropine drops.      Intervention Category Major Interventions: Other:  Tera Partridge 02/07/2016, 11:03 PM

## 2016-02-07 NOTE — Progress Notes (Addendum)
[facility] Progress Note Patient Name: Ruth [name] DOB: Feb 10, 1933 MRN: [id number]   Date of Service  02/07/2016  HPI/Events of Note  Notified regarding PaO2 50 on PEEP 5.    eICU Interventions  Increasing PEEP to 10. Repeat ABG  At midnight     Intervention Category Major Interventions: Hypoxemia - evaluation and management  Tera Partridge 02/07/2016, 6:44 PM

## 2016-02-07 NOTE — Progress Notes (Addendum)
AAA Progress Note    02/07/2016 7:54 AM 7 Days Post-Op  Subjective:  Re-intubated yesterday morning.  Afebrile HR  90's-100's NSR/ST [id number] systolic [id number] .50% FiO2  Gtts:  Levophed (requirements increased)   Vitals:   02/07/16 0715 02/07/16 0735  BP:  (!) 173/66  Pulse: (!) 109 (!) 123  Resp: (!) 23 20  Temp:      Physical Exam: Cardiac:  regular Lungs:  CTA anteriorly Abdomen:  Soft, ND; -BS; +BM yesterday Incisions:  Clean and dry with steri strips in place Extremities:  +palpable left femoral pulse; temporary line in right groin; 1-4th fingertips bilaterally & right thumb with discoloration; +palpable radial pulse right difficult to feel the left radial Right arm with some swelling and erythema & warmth in the distal arm.  PICC line present.  CBC    Component Value Date/Time   WBC 25.9 (H) 02/07/2016 0330   RBC 3.65 (L) 02/07/2016 0330   HGB 10.9 (L) 02/07/2016 0330   HGB 15.4 04/20/2015 1514   HCT 33.3 (L) 02/07/2016 0330   HCT 46.6 04/20/2015 1514   PLT 85 (L) 02/07/2016 0330   PLT 207 04/20/2015 1514   MCV 91.2 02/07/2016 0330   MCV 95.6 04/20/2015 1514   MCH 29.9 02/07/2016 0330   MCHC 32.7 02/07/2016 0330   RDW 16.0 (H) 02/07/2016 0330   RDW 13.6 04/20/2015 1514   LYMPHSABS 0.3 (L) 02/03/2016 0401   LYMPHSABS 1.8 04/20/2015 1514   MONOABS 0.3 02/03/2016 0401   MONOABS 0.5 04/20/2015 1514   EOSABS 0.0 02/03/2016 0401   EOSABS 0.1 04/20/2015 1514   BASOSABS 0.0 02/03/2016 0401   BASOSABS 0.1 04/20/2015 1514    BMET    Component Value Date/Time   NA 141 02/07/2016 0330   NA 141 04/20/2015 1514   K 3.5 02/07/2016 0330   K 4.1 04/20/2015 1514   CL 107 02/07/2016 0330   CL 105 03/26/2012 1145   CO2 27 02/07/2016 0330   CO2 30 (H) 04/20/2015 1514   GLUCOSE 143 (H) 02/07/2016 0330   GLUCOSE 119 04/20/2015 1514   GLUCOSE 89 03/26/2012 1145   BUN 39 (H) 02/07/2016 0330   BUN 16.1 04/20/2015 1514   CREATININE 1.43 (H) 02/07/2016 0330   CREATININE 1.1 04/20/2015 1514   CALCIUM 7.5 (L) 02/07/2016 0330   CALCIUM 9.6 04/20/2015 1514   GFRNONAA 34 (L) 02/07/2016 0330   GFRAA 39 (L) 02/07/2016 0330    INR    Component Value Date/Time   INR 1.27 02/02/2016 1045     Intake/Output Summary (Last 24 hours) at 02/07/16 0754 Last data filed at 02/07/16 0700  Gross per 24 hour  Intake          2373.01 ml  Output              845 ml  Net          1528.01 ml   Respiratory Cx 02/06/16: Specimen Description TRACHEAL ASPIRATE  Special Requests NONE  Gram Stain ABUNDANT WBC PRESENT, PREDOMINANTLY PMN  NO SQUAMOUS EPITHELIAL CELLS SEEN  ABUNDANT GRAM NEGATIVE RODS  RARE GRAM POSITIVE COCCI IN CLUSTERS      Culture      Assessment/Plan:  82 y.o. female is s/p  Open AAA 7 Days Post-Op  -pt re-intubated yesterday and on .[id number] -requiring Levophed for pressure support-this could account for the discoloration in her finger tips vs finger sticks for BS-will continue to monitor -some right arm swelling distally-will get a venous duplex  of the right arm. -increasing leukocytosis-pt is afebrile.  Check urine; incision looks good. -CXR with ? pleural effusions vs consolidation/atelectasis.  Resp gram stain reveals abundant GNR and rare GPC in clusters.   -nutrition-receiving TF @ 20cc/hr   Leontine Locket, PA-C [facility] [phone] 02/07/2016 7:54 AM   I have examined the patient, reviewed and agree with above.Unresponsive on vent. Continue maximal support. Hemodynamically stable on levophed currently.Curt Jews, MD 02/07/2016 10:03 AM

## 2016-02-07 NOTE — Procedures (Signed)
Extubation Procedure Note  Patient Details:   Name: Ruth [name] DOB: December 12, 1932 MRN: [id number]   Airway Documentation:     Evaluation  O2 sats: currently acceptable Complications: No apparent complications Patient did tolerate procedure well. Bilateral Breath Sounds: Rhonchi, Diminished  Pt. made DNR status with order: "Withdrawl of Life Sustaining Treatment" @2303  on 02/07/2016, RN aware @ bedside, No complications, MS started by RN prior to procedure, family remaining at bedside, uneventful, no complications.  Winferd Humphrey 02/07/2016, 11:35 PM

## 2016-02-07 NOTE — Care Management Important Message (Signed)
Important Message  Patient Details  Name: Ruth [name] MRN: [id number] Date of Birth: 1932/12/15   Medicare Important Message Given:  Yes    Nathen May 02/07/2016, 10:44 AM

## 2016-02-07 NOTE — Progress Notes (Signed)
Difficulty getting good pulse ox reading. RT at bedside. Elink camera in. Max out levo and neo to support blood pressure

## 2016-02-07 NOTE — Progress Notes (Signed)
  Echocardiogram 2D Echocardiogram has been performed.  Ruth [name] 02/07/2016, 12:17 PM

## 2016-02-07 NOTE — Progress Notes (Signed)
PULMONARY / CRITICAL CARE MEDICINE   Name: Ruth [name] MRN: [id number] DOB: April 08, 1933    ADMISSION DATE:  02/07/2016 CONSULTATION DATE:  8/9  REFERRING MD:  Early  CHIEF COMPLAINT:  Post AAA repair 8/7 with decreased urine output and rising creatine 8/9.  HISTORY OF PRESENT ILLNESS:   82 yo 1 ppd smoker x 61 years, recent TIA(7/17) without residuals who was scheduled for AAA repair in July but was postponed due to TIA. She had AAA repair 8/7 with cross clamp time of 40 minutes and extubated in OR with out issue. On 8/9 she has minimal urine output, Hypotension with sitting up along with syncope. Due to COPD , delerium, worsening renal failure PCCM asked to evaluate and assist in her care.  Bradycardia, respiratory distress , Developed loss of consciousness and emergently intubated 8/13  SUBJECTIVE:  Afebrile Tachy - on levo gtt On int fent for sedation - no obvious pain  VITAL SIGNS: BP (!) 173/66   Pulse (!) 117   Temp 98.1 F (36.7 C) (Axillary)   Resp (!) 28   Ht 5' (1.524 m)   Wt 49 kg (108 lb 0.4 oz)   SpO2 98%   BMI 21.10 kg/m   HEMODYNAMICS:    VENTILATOR SETTINGS: Vent Mode: PSV;CPAP FiO2 (%):  [40 %-60 %] 50 % Set Rate:  [16 bmp] 16 bmp Vt Set:  [370 mL] 370 mL PEEP:  [5 cmH20] 5 cmH20 Pressure Support:  [10 cmH20] 10 cmH20 Plateau Pressure:  [14 cmH20] 14 cmH20  INTAKE / OUTPUT: I/O last 3 completed shifts: In: 2706.1 [I.V.:1251.1; Blood:335; NG/GT:970; IV Piggyback:150] Out: 1955 [Urine:1755; Emesis/NG output:200]  PHYSICAL EXAMINATION: General:chr  ill appearing Neuro: unresponsive, moves all 4 Es spont HEENT: ETT in place, pupils reactive Cardiac: regular, no murmur Chest: b/l rhonchi Abd: wound site clean, mild distention Ext: 2+ edema BUE with bruising over RUE Skin: multiple areas of ecchymosis on chest and abdomen  LABS:  BMET  Recent Labs Lab 02/05/16 0351 02/06/16 0355 02/07/16 0330  NA 141 144 141  K 4.1 3.4* 3.5  CL 111 105  107  CO2 25 30 27   BUN 24* 30* 39*  CREATININE 1.20* 1.24* 1.43*  GLUCOSE 125* 120* 143*    Electrolytes  Recent Labs Lab 02/05/16 0351 02/06/16 0355 02/07/16 0330  CALCIUM 7.7* 7.7* 7.5*  MG 2.1 1.8 1.8  PHOS 2.1* 3.1 2.8    CBC  Recent Labs Lab 02/06/16 0355 02/06/16 1400 02/07/16 0330  WBC 14.3* 19.4* 25.9*  HGB 7.7* 10.4* 10.9*  HCT 24.1* 31.5* 33.3*  PLT 94* 89* 85*    Coag's  Recent Labs Lab 01/30/2016 1400 02/02/16 1045  APTT 28 47*  INR 1.27 1.27    Sepsis Markers No results for input(s): LATICACIDVEN, PROCALCITON, O2SATVEN in the last 168 hours.  ABG  Recent Labs Lab 02/06/16 0400 02/06/16 0813 02/07/16 0333  PHART 7.401 7.338* 7.406  PCO2ART 47.6* 57.0* 43.2  PO2ART 71.5* 256.0* 56.0*    Liver Enzymes  Recent Labs Lab 02/01/16 0337 02/03/16 0401  AST 35 32  ALT 22 24  ALKPHOS 83 124  BILITOT 0.6 0.8  ALBUMIN 2.4* 1.7*    Cardiac Enzymes  Recent Labs Lab 02/06/16 2000 02/07/16 0145 02/07/16 0712  TROPONINI 0.05* 0.04* 0.03*    Glucose  Recent Labs Lab 02/06/16 1223 02/06/16 1552 02/06/16 1959 02/06/16 2340 02/07/16 0335 02/07/16 0757  GLUCAP 170* 151* 134* 141* 127* 129*    Imaging Dg Chest Port 1 [address]  Result Date: 02/07/2016 CLINICAL DATA:  Endotracheal tube has been retracted. Acute respiratory failure. EXAM: PORTABLE CHEST 1 VIEW COMPARISON:  02/07/2016 FINDINGS: Endotracheal tube is approximately 3.9 cm above the carina and appropriately positioned. Nasogastric tube tip is near the GE junction. Feeding tube extends into the abdomen. Persistent hazy densities in lower lungs suggestive for pleural fluid and atelectasis/consolidation. PICC line tip in the SVC region. Heart size is normal. Again is a sclerotic density at the right humeral head. This could be related to an enchondroma. IMPRESSION: Endotracheal tube is appropriately positioned. Persistent bibasilar chest densities are suggestive for pleural fluid  and consolidation/atelectasis. Nasogastric tube near the GE junction. Electronically Signed   By: Markus Daft M.D.   On: 02/07/2016 07:47   Dg Chest Port 1 View  Result Date: 02/07/2016 CLINICAL DATA:  Respiratory failure. EXAM: PORTABLE CHEST 1 VIEW COMPARISON:  02/06/2016.  02/04/2016. FINDINGS: Endotracheal tube tip noted noted orifice the right mainstem bronchus. Proximal repositioning of approximately 3- 4 cm suggested. Right PICC line stable position. Heart size stable. Bilateral lower lobe pulmonary infiltrates and/or pulmonary edema noted. Prominent nipple shadow again noted projected over the left base. Small bilateral pleural effusions noted. No pneumothorax. Surgical clips left chest. IMPRESSION: 1. Endotracheal tube tip noted in the orifice of the right mainstem bronchus. Proximal repositioning of approximately 3 4 cm suggested. Right PICC line stable position. 2. Bilateral lower lobe pulmonary infiltrates and or edema noted on today's exam. Small bilateral pleural effusions. Critical Value/emergent results were called by telephone at the time of interpretation on 02/07/2016 at 7:07 am to nurse Janett Billow , who verbally acknowledged these results. Electronically Signed   By: Marcello Moores  Register   On: 02/07/2016 07:09     STUDIES:  Duplex 8/14 >>  CULTURES: none  ANTIBIOTICS: ceftaz 8/14 >>  SIGNIFICANT EVENTS: 8/7 AAA repair 8/9 respiratory arrest leading to bradycardia/hypotension >> VDRF  LINES/TUBES: 8/7 rt I j cordis >> 8/11 8/7 ett >> 8/7 8/7 aline >> 8/8 8/11 Rt PICC >>  8/13 ETT >>  DISCUSSION: 82 yo female with severe delerium  s/p AAA repair.  She has hx of severe COPD and nocturnal hypoxemia.  Developed respiratory arrest leading to bradycardia and altered mental status 8/13, requiring emergent intubation.  ASSESSMENT / PLAN:  PULMONARY A: Acute hypoxic respiratory failure. Severe COPD, smoker  >>FEV1 (L) *0.97 FEV1/FVC (%) *55. P:   SBTs but hold off  extubation Scheduled BDs ETT adjusted  CARDIOVASCULAR A:  Bradycardia, hypotension in setting of respiratory arrest 8/13. Hx of HTN. Orthostatic hypotension. AAA repair 8/7.  Echo 11/2015 >> nml LVEF, VSP 50 P:  Levophed to keep MAP > 65 Await rpt echo   RENAL A:   AKI post AAA 8/7, resolved. NAG acidosis >> resolved. Hypokalemia. P:   Replace potassium  GASTROINTESTINAL A:   Abdominal distention after intubation 8/13. Protein calorie malnutrition. Constipation. P:   Tube feeds via cortrak, dc OGT Pepcid for SUP Bowel regimen  HEMATOLOGIC A:   Anemia of critical illness and chronic disease. s/p  1 unit PRBC 8/13 Thrombocytopenia - was 150k on admit, 200k back in 2016 P:  SCDs Follow plts Duplex RUE planned by vascular  INFECTIOUS A:   HCAP Rising WBC P:   Start ceftaz empiric, await GNR in sputum  ENDOCRINE A:   Hyperglycemia. P:   SSI  NEUROLOGIC A:   Acute metabolic encephalopathy. Hx of TIA July 2017. P:   RASS goal: 0 D/c haldol, fent prn  CT head  CC time 35 minutes.   Kara Mead MD. [facility]. [facility] Pager [phone] If no response call [phone]    02/07/2016, 8:37 AM

## 2016-02-07 NOTE — Progress Notes (Signed)
[facility] Progress Note Patient Name: CICILIA MASEDA DOB: 06/03/1933 MRN: [id number]   Date of Service  02/07/2016  HPI/Events of Note  Contacted by bedside nurse of persistent sinus tachycardia with intermittent periods of ectopy. Shock is also worsened requiring escalating doses of Levophed. Currently saturating 99% without requirement of FiO2 adjustment.   eICU Interventions  1. Starting Neo-Synephrine infusion to spare Levophed 2. Checking BMP, magnesium, & ABG now      Intervention Category Major Interventions: Shock - evaluation and management  Tera Partridge 02/07/2016, 5:57 PM

## 2016-02-07 NOTE — Progress Notes (Signed)
Patient ID: Ruth [name], female   DOB: 09-Sep-1932, 82 y.o.   MRN: [id number] Discussed the patient's current clinical status with her husband and niece present. He asked if it was hopeless and I explained that she certainly has a guarded prognosis. He was asking percentage chance for survival. I did not give the percentage but explained that she had some chance for survival but it was more difficult as more systems failed. They were appreciative regarding the care she has received. Explained that we are continuing with maximal support area and also explained that if she continues to deteriorate over the next several days that decision to withdraw support may need to be made.

## 2016-02-07 NOTE — Progress Notes (Signed)
Pharmacy Antibiotic Note  Ruth [name] is a 82 y.o. female admitted on 02/09/2016 with GNR in sputum. Re-intubated 8/13 AM. Pharmacy has been consulted for ceftazidime dosing. Afebrile, wbc up 25.9, CrCl~22.  Plan: Ceftazidime 2g IV q24h Monitor clinical progress, c/s, renal function, abx plan/LOT  Height: 5' (152.4 cm) Weight: 108 lb 0.4 oz (49 kg) IBW/kg (Calculated) : 45.5  Temp (24hrs), Avg:98 F (36.7 C), Min:97.5 F (36.4 C), Max:99.2 F (37.3 C)   Recent Labs Lab 02/03/16 0401 02/04/16 0435 02/05/16 0351 02/06/16 0355 02/06/16 1400 02/07/16 0330  WBC 10.5 8.1 8.9 14.3* 19.4* 25.9*  CREATININE 1.32* 1.25* 1.20* 1.24*  --  1.43*    Estimated Creatinine Clearance: 22.5 mL/min (by C-G formula based on SCr of 1.43 mg/dL).    Allergies  Allergen Reactions  . Latex Other (See Comments)    Non-specified. Pt stated she had a latex allergy in the past.     Antimicrobials this admission: 8/14 ceftaz >>   Dose adjustments this admission:   Microbiology results: 8/14 TA:  8/13 TA: abundant GNR, rare GPC  7/31 MRSA PCR: neg   Elicia Lamp, PharmD, [facility] Clinical Pharmacist Pager [phone] 02/07/2016 8:43 AM

## 2016-02-07 NOTE — Progress Notes (Signed)
Nutrition Follow Up  DOCUMENTATION CODES:   Severe malnutrition in context of chronic illness  INTERVENTION:    Recommend Vital AF 1.2 formula at goal rate of 40 ml/hr to provide 1152 kcals, 72 gm protein, 778 ml of water  NUTRITION DIAGNOSIS:   Inadequate oral intake related to lethargy/confusion as evidenced by NPO status, ongoing  GOAL:   Patient will meet greater than or equal to 90% of their needs, progressing  MONITOR:   Vent status, TF tolerance, Labs, Weight trends, I & O's  ASSESSMENT:   82 yo 1 ppd smoker x 61 years, recent TIA(7/17) without residuals who was scheduled for AAA repair in July but was postponed due to TIA. She had AAA repair 8/7 with cross clamp time of 40 minutes and extubated in OR with out issue. On 8/9 she has minimal urine output, Hypotension with sitting up along with syncope. Due to COPD , delerium, worsening renal failure PCCM asked to evaluate and assist in her care.   Patient s/p procedure 8/7: RESECTION AND GRAFTING ABDOMINAL AORTIC ANEURYSM   Patient is currently intubated on ventilator support MV: 9.8 L/min Temp (24hrs), Avg:98.1 F (36.7 C), Min:97.3 F (36.3 C), Max:99.2 F (37.3 C)   Vital AF 1.2 formula currently infusing at 20 ml/hr via CORTRAK feeding tube (tip at 2nd portion of duodenum). Providing 576 kcals, 36 gm protein, 389 ml of free water. Nutrition-Focused physical exam completed. Findings are severe fat depletion and severe muscle depletion. Vascular Surgery noted reviewed >> prognosis guarded at this time.  Diet Order:  Diet NPO time specified  Skin:  Reviewed, no issues  Last BM:  8/13  Height:   Ht Readings from Last 1 Encounters:  02/11/2016 5' (1.524 m)    Weight:   Wt Readings from Last 1 Encounters:  02/07/16 108 lb 0.4 oz (49 kg)    Ideal Body Weight:  45.4 kg  BMI:  Body mass index is 21.1 kg/m.  Estimated Nutritional Needs:   Kcal:  1170  Protein:  70-80 gm  Fluid:  >/= 1.5  L  EDUCATION NEEDS:   No education needs identified at this time  Arthur Holms, RD, LDN Pager #: [phone] After-Hours Pager #: [phone]

## 2016-02-07 NOTE — Progress Notes (Signed)
Preliminary results by tech - Right Upper Ext. Venous Duplex Completed. Acute, partial thrombus in the distal subclavian vein, axillary and one of the paired proximal brachial vein. There is also superficial vein thrombosis in the basilic vein. Results given to patient's nurse Thayer Headings.  Oda Cogan, BS, RDMS, RVT

## 2016-02-08 LAB — CULTURE, RESPIRATORY W GRAM STAIN

## 2016-02-08 LAB — CULTURE, RESPIRATORY

## 2016-02-09 LAB — CULTURE, RESPIRATORY W GRAM STAIN

## 2016-02-09 LAB — CULTURE, RESPIRATORY

## 2016-02-25 NOTE — Progress Notes (Signed)
Wasted 250ml of Morphine with Inis Sizer RN

## 2016-02-25 NOTE — Accreditation Note (Signed)
o Restraints not reported to CMS Pursuant to regulation 482.13 (G) (3) use of soft wrist restraints was logged on 08.16.2017 at 1010 by Evette Cristal, RN, Patient Safety and Accreditation.

## 2016-02-25 NOTE — Progress Notes (Signed)
Dr. Donnetta Hutching notified of pt's worsening condition and additional pressors per Elink orders. Dr. Donnetta Hutching stated he had a lengthy convo with family today about pt's condition. Family notified of pt's current condition and state they will be arriving to the hospital shortly

## 2016-02-25 NOTE — Progress Notes (Signed)
Pt asystole on monitor. Apical pulse auscultated x 44min to confirm asystole. Verified by Inis Sizer RN. Family present at bedside. Postmortem checklist completed. Family declined autopsy. Unsure of funeral home at this time, given number to patient placement to update regarding funeral home in the morning

## 2016-02-25 NOTE — Progress Notes (Signed)
Family present at bedside. Pt's husband brought in copy of pt's DNR from their will. Dr. Ashok Cordia video in to update family on pt's current condition. Pt's husband decided to stop current plan of care and transition pt to comfort care. Family declined a chaplain to be present at bedside

## 2016-02-25 NOTE — Discharge Summary (Signed)
[facility] Death Summary  LAKAYA BLAESING 22-Jun-1933 82 y.o. female  ES:[id number]  Admission Date: 02/09/16  Date of Death: 2016/02/17  Physician: Curt Jews, MD  Admission Diagnosis: Abdominal aortic aneurysm I71.4  HPI:   This is a 82 y.o. female who presented to the [facility] office for discussion of her upcoming aneurysm repair. She had suffered a TIA prior to her scheduled repair. This was postponed and she has had the full neurology workup has been cleared for surgery. She is here today with her husband. She is completely back to her baseline.   She had undergone cardiac clearance with Dr. Gwenlyn Found. She has had a 2-D echo and a Myoview which shows showed no evidence of reversible ischemia and normal LV function. Dr. Donnetta Hutching reviewed her films again with the the device manufacturer and does not appear that she is a candidate for stent graft in which is what we discussed. Her neck of her aorta is very complex and begins just below the renal artery takeoff. She also has a significant baseline pulmonary dysfunction.   Hospital Course:  The patient was admitted to the hospital and taken to the operating room on 2016-02-09 and underwent: open abdominal aneurysm repair.   The patient tolerated the procedure well, was extubated in the OR and was transported to the PACU in stable condition.   Overnight, the patient had a drop in her urine output to 10 mL/h x 2 hours. Verbal orders were to give 500 mL NS bolus over 2 hours. After two hours, her urine output minimal slowly improved with only 35 mL per hour for 3 hours. A repeat 500 mL normal saline bolus was ordered.   POD 1: The patient was stable. Her urine output was down despite receiving several boluses. Chest x-ray showed no evidence of congestive heart failure. She was given additional volume. She had minimal NG output and this was discontinued. She complained of abdominal pain but her abdomen was soft. She had 2+ dorsalis pedis  pulses bilaterally.  POD 2: The patient came agitated and confused during the evening. She was sedated with Haldol. She also was tachycardic. This was felt to be related to her agitation. Critical care was consult for assistance with postoperative delirium. She was kept in the ICU.  POD 3: The patient remained confused. She was afebrile. Her urine output continued to be poor but was improved over the last 12 hours. Early that morning, the patient was noticed to have increasing lethargy and difficulty managing secretions. She also required up titration of oxygen. Her delirium persisted but was better. Her acute kidney injury was improving. She had non-anion gap acidosis and was on a bicarbonate drip.  POD 4: The patient was much more alert. She was not having any respiratory distress. She continued having difficulty controlling her secretions. She was unable to cooperate with her problem pulmonary toilet. Her creatinine continued to improve. She had easily palpable femoral and dorsalis pedis pulses bilaterally.  POD 5: The patient remained confused. Her labs showed no leukocytosis or uremia. An ammonia was checked. There is concern she may need to be intubated if she cannot manage her secretions. He had adequate urine output with improving creatinine.  POD 6: The patient had bradycardia, respiratory distress and loss of consciousness that morning and required emergent intubation. She was transfused 1 unit of packed red blood cells for anemia of critical illness chronic disease. She was started on levophed.   POD 7: The patient was hemodynamically  stable on levo fed. She was unresponsive on the vent. She had some right arm swelling distally. A venous duplex of the right arm was ordered. She had increasing leukocytosis but remained afebrile. UA was ordered. Her incisions were healing well. Her chest x-ray showed possible pleural effusions versus consolidation and atelectasis. Her respiratory Gram stain  revealed abundant gram-negative rods and rare gram-positive cocci in clusters. She was started on empiric ceftaz for likely HCAP given her positive sputum cultures (pseudomonas) . She was was receiving tube feeds.  A CT of the head was ordered showing small vessel disease but no bleed. Her right arm duplex showed partial occlusive DVT in her right axillary subclavian vein that was likely related to the PICC line. Given lower risk for PE, she was not started on anticoagulation given her current state. That evening she had persistent sinus tachycardia with intermittent periods of ectopy. Her shock was worsening requiring escalating doses of levophed.   She was started on Neo-Synephrine. Dr. Donnetta Hutching had a lengthy conversation with the family regarding the patient's poor prognosis. Overnight the patient was requiring additional pressors. The patient's husband decided to stop current plan of care and transition the patient to comfort care. The patient was extubated that night.  POD 8: The patient deceased on 02/23/2016.   Addendum  The patient was seen by a dietician and was diagnosed with protein malnutrition. This may be classified as severe protein malnutrition.    CBC    Component Value Date/Time   WBC 25.9 (H) 02/07/2016 0330   RBC 3.65 (L) 02/07/2016 0330   HGB 11.9 (L) 02/07/2016 2053   HGB 15.4 04/20/2015 1514   HCT 36.8 02/07/2016 2053   HCT 46.6 04/20/2015 1514   PLT 85 (L) 02/07/2016 0330   PLT 207 04/20/2015 1514   MCV 91.2 02/07/2016 0330   MCV 95.6 04/20/2015 1514   MCH 29.9 02/07/2016 0330   MCHC 32.7 02/07/2016 0330   RDW 16.0 (H) 02/07/2016 0330   RDW 13.6 04/20/2015 1514   LYMPHSABS 0.3 (L) 02/03/2016 0401   LYMPHSABS 1.8 04/20/2015 1514   MONOABS 0.3 02/03/2016 0401   MONOABS 0.5 04/20/2015 1514   EOSABS 0.0 02/03/2016 0401   EOSABS 0.1 04/20/2015 1514   BASOSABS 0.0 02/03/2016 0401   BASOSABS 0.1 04/20/2015 1514    BMET    Component Value Date/Time   NA 136  02/07/2016 1809   NA 141 04/20/2015 1514   K 4.4 02/07/2016 1809   K 4.1 04/20/2015 1514   CL 106 02/07/2016 1809   CL 105 03/26/2012 1145   CO2 21 (L) 02/07/2016 1809   CO2 30 (H) 04/20/2015 1514   GLUCOSE 145 (H) 02/07/2016 1809   GLUCOSE 119 04/20/2015 1514   GLUCOSE 89 03/26/2012 1145   BUN 47 (H) 02/07/2016 1809   BUN 16.1 04/20/2015 1514   CREATININE 1.98 (H) 02/07/2016 1809   CREATININE 1.1 04/20/2015 1514   CALCIUM 7.4 (L) 02/07/2016 1809   CALCIUM 9.6 04/20/2015 1514   GFRNONAA 23 (L) 02/07/2016 1809   GFRAA 26 (L) 02/07/2016 1809     Discharge Instructions:   The patient is discharged to home with extensive instructions on wound care and progressive ambulation. They are instructed not to drive or perform any heavy lifting until returning to see the physician in his office.    Discharge Diagnosis:  Abdominal aortic aneurysm I71.4  Secondary Diagnosis: Patient Active Problem List   Diagnosis Date Noted  . Respiratory failure ([place])   .  Status post abdominal aortic aneurysm repair   . Abdominal aortic aneurysm (AAA) ([place]) 02/11/2016  . TIA (transient ischemic attack) 01/03/2016  . Nocturnal hypoxemia 07/21/2015  . COPD exacerbation ([place]) 05/24/2015  . Recurrent UTI 04/21/2014  . Smoking 04/21/2014  . AAA (abdominal aortic aneurysm) without rupture ([place]) 03/31/2014  . CVA (cerebral infarction) 11/26/2013  . Protein-calorie malnutrition, severe ([place]) 11/26/2013  . Syncope 11/25/2013  . Recurrent Clostridium difficile diarrhea 11/15/2013  . Dehydration 11/15/2013  . Weakness generalized 11/15/2013  . Shiga toxin-producing Escherichia coli (E. coli) ([facility]), unspecified 11/15/2013  . Colitis 11/15/2013  . Osteoporosis, unspecified 04/01/2013  . Hyperlipidemia 04/01/2013  . Hearing loss 04/01/2013  . Breast cancer, left ([place]) 06/13/2011   Past Medical History:  Diagnosis Date  . AAA (abdominal aortic aneurysm) ([place])   . Breast cancer ([place])   . Cancer ([place])      cervical / ovarian   . COPD (chronic obstructive pulmonary disease) ([place])   . Hearing loss 04/01/2013  . Hyperlipidemia 04/01/2013  . Osteoporosis, unspecified 04/01/2013  . Ovarian cancer ([place])   . Skin cancer   . Stroke ([place])    TIAs  . Syncope and collapse        Medication List    ASK your doctor about these medications   alendronate 70 MG tablet Commonly known as:  FOSAMAX Take 70 mg by mouth once a week. Take with a full glass of water on an empty stomach. On Monday   aspirin 81 MG tablet Take 81 mg by mouth daily.   calcium carbonate 750 MG chewable tablet Commonly known as:  TUMS EX Chew 1 tablet by mouth daily.   cholecalciferol 1000 units tablet Commonly known as:  VITAMIN D Take 1 tablet (1,000 Units total) by mouth daily.   clopidogrel 75 MG tablet Commonly known as:  PLAVIX Take 1 tablet (75 mg total) by mouth daily.   fish oil-omega-3 fatty acids 1000 MG capsule Take 1 g by mouth daily.   ibuprofen 200 MG tablet Commonly known as:  ADVIL,MOTRIN Take 200 mg by mouth every 6 (six) hours as needed for moderate pain.   pravastatin 10 MG tablet Commonly known as:  PRAVACHOL Take 10 mg by mouth daily.   PROAIR RESPICLICK 123XX123 (90 Base) MCG/ACT Aepb Generic drug:  Albuterol Sulfate Inhale 2 puffs into the lungs daily as needed (shortness of breath).   tamoxifen 20 MG tablet Commonly known as:  NOLVADEX Take 1 tablet (20 mg total) by mouth daily.   tiotropium 18 MCG inhalation capsule Commonly known as:  SPIRIVA Place 18 mcg into inhaler and inhale daily.       Patient's condition: is deceased    Virgina Jock, [facility] [phone] 02-14-2016  2:15 PM   - For VQI Registry use ---   Post-op:  Time to Extubation: [x ] In OR, [ ]  < 12 hrs, [ ]  12-24 hrs, [ ]  >=24 hrs Vasopressors Req. Post-op: Yes ICU Stay: 7 days Transfusion: Yes  If yes, 1 units given MI: No, [ ]  Troponin only, [ ]  EKG or Clinical New  Arrhythmia: No  Complications: CHF: No Resp failure: Yes, [ ]  Pneumonia, [x ] Ventilator Chg in renal function: Yes, [ x] Inc. Cr > 0.5, [ ]  Temp. Dialysis, [ ]  Permanent dialysis Leg ischemia: No, no Surgery needed, [ ]  Yes, Surgery needed, [ ]  Amputation Bowel ischemia: No, [ ]  Medical Rx, [ ]  Surgical Rx Wound complication: No, [ ]  Superficial  separation/infection, [ ]  Return to OR Return to OR: No  Return to OR for bleeding: No Stroke: No, [ ]  Minor, [ ]  Major

## 2016-02-25 DEATH — deceased

## 2016-04-20 ENCOUNTER — Other Ambulatory Visit: Payer: Medicare Other

## 2016-04-20 ENCOUNTER — Ambulatory Visit: Payer: Medicare Other | Admitting: Nurse Practitioner

## 2017-06-25 IMAGING — CR DG CHEST 1V PORT
1 series · 1 of 1 positions shown · non-contrast
Comparison: 02/06/2016.  02/04/2016.

CLINICAL DATA: Respiratory failure.

EXAM:
PORTABLE CHEST 1 VIEW

[AP]
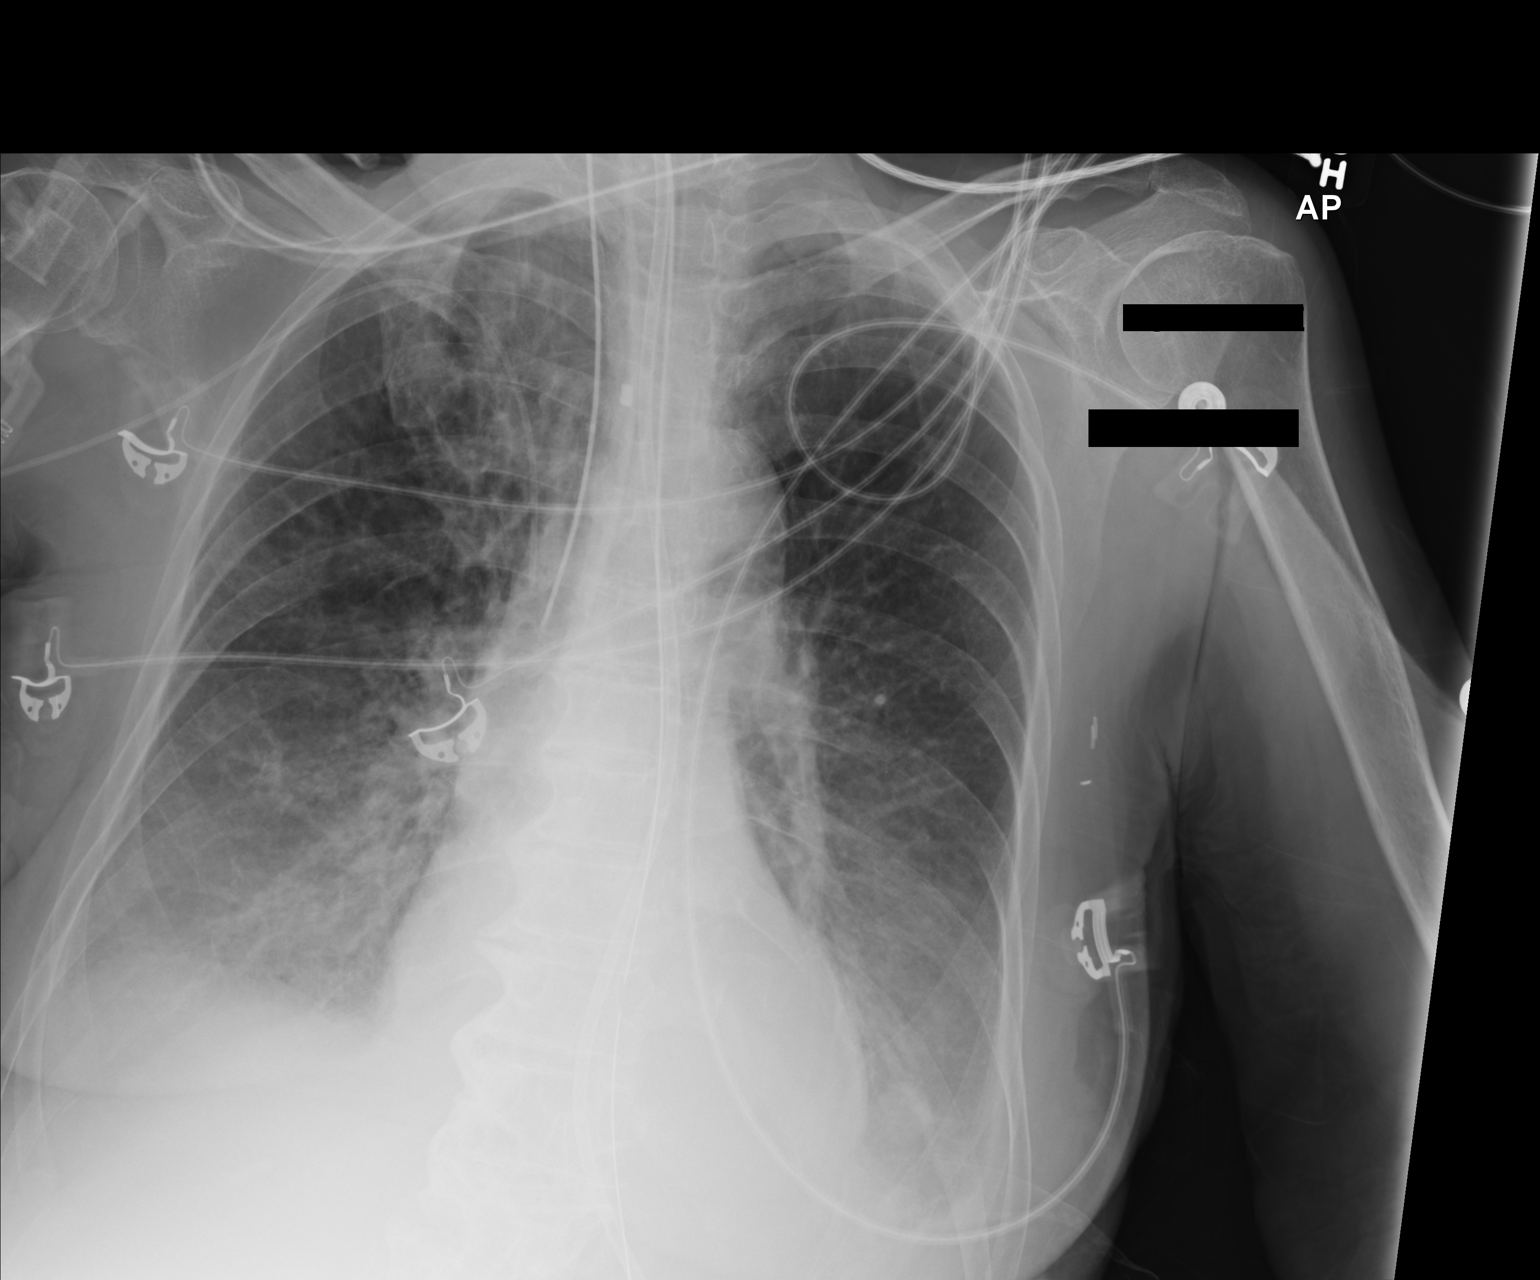

[1 of 1 positions shown; findings below may reference images not displayed]

FINDINGS: Endotracheal tube tip noted noted orifice the right mainstem
bronchus. Proximal repositioning of approximately 3- 4 cm suggested.
Right PICC line stable position. Heart size stable. Bilateral lower
lobe pulmonary infiltrates and/or pulmonary edema noted. Prominent
nipple shadow again noted projected over the left base. Small
bilateral pleural effusions noted. No pneumothorax. Surgical clips
left chest.
IMPRESSION: 1. Endotracheal tube tip noted in the orifice of the right mainstem
bronchus. Proximal repositioning of approximately 3 4 cm suggested.
Right PICC line stable position.
2. Bilateral lower lobe pulmonary infiltrates and or edema noted on
today's exam. Small bilateral pleural effusions.
Critical Value/emergent results were called by telephone at the time
of interpretation on 02/07/2016 at [DATE] to nurse Reog , who
verbally acknowledged these results.

## 2017-06-25 IMAGING — CT CT HEAD W/O CM
4 series · 16 of 47 positions shown, 18 images · non-contrast
Comparison: 06/02/2014 CT.  06/09/2014 MR.

CLINICAL DATA: Lethargic.  Does not follow commands.

EXAM:
CT HEAD WITHOUT CONTRAST
TECHNIQUE: Contiguous axial images were obtained from the base of the skull
through the vertex without intravenous contrast.

[Series 2: head without · axial · non-contrast · 0.39mm/px · z∈[-163,-53]mm · 7 of 30 slices shown, 9 images]
[im 4/30  brain]
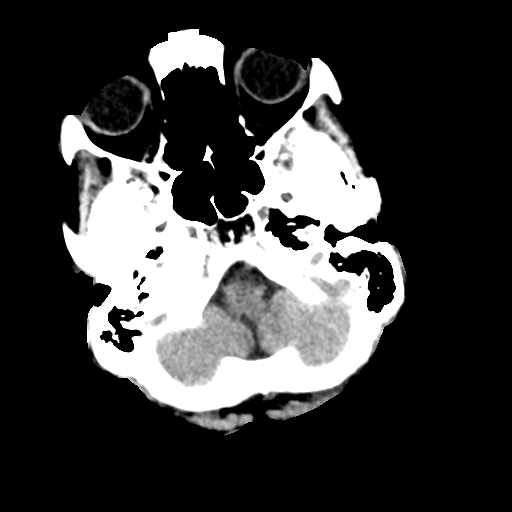
[im 4/30  bone]
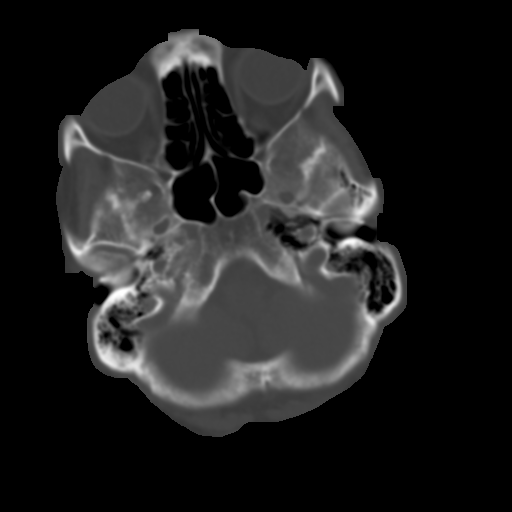
[im 8/30  brain]
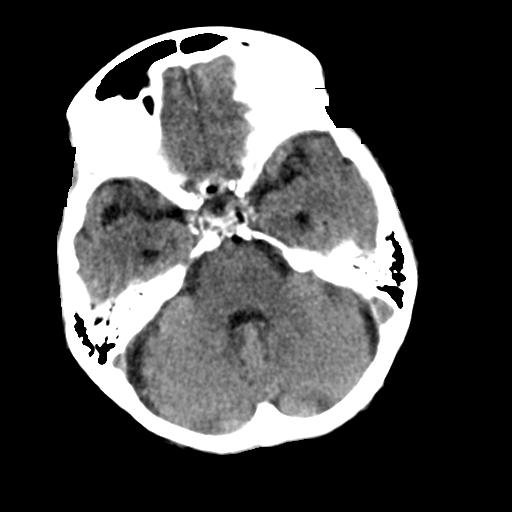
[im 11/30  brain]
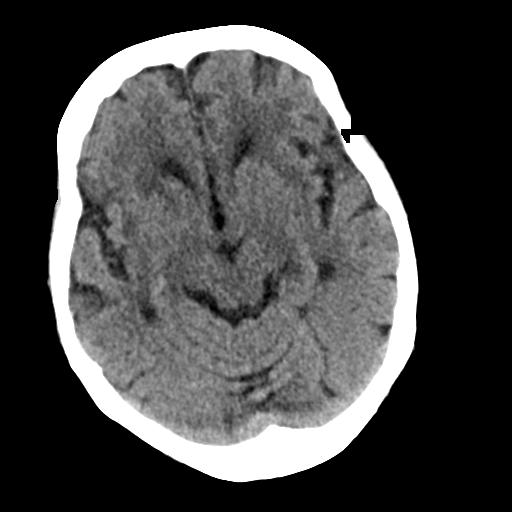
[im 15/30  brain]
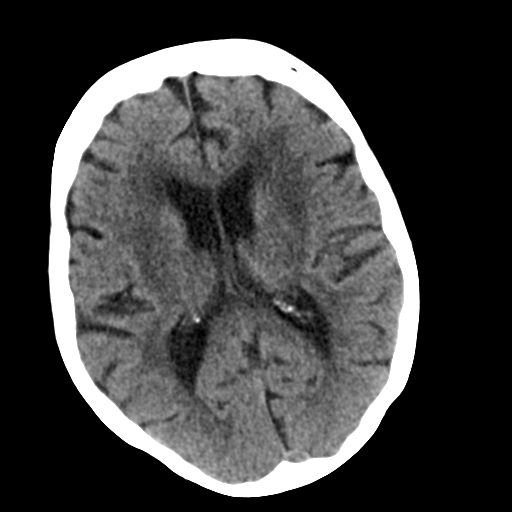
[im 19/30  brain]
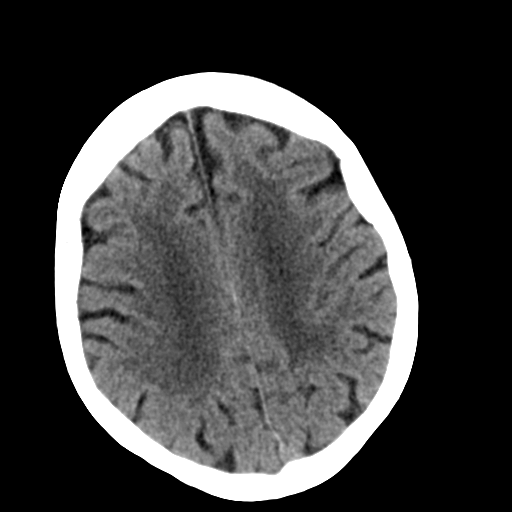
[im 19/30  bone]
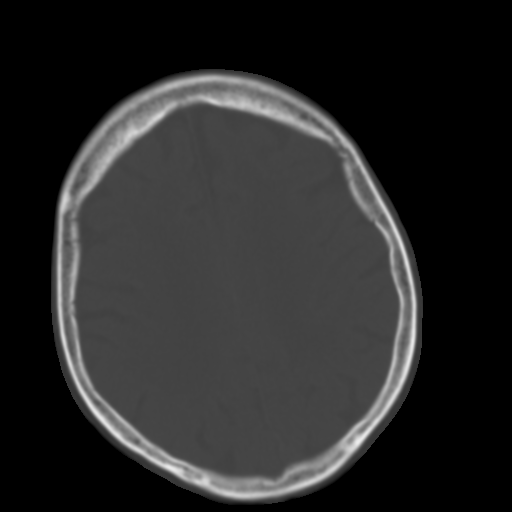
[im 22/30  brain]
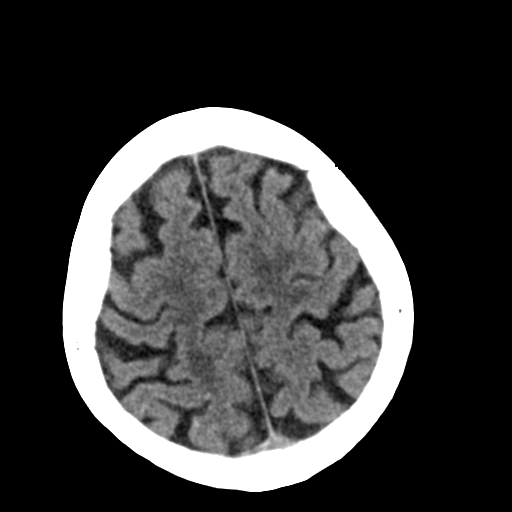
[im 26/30  brain]
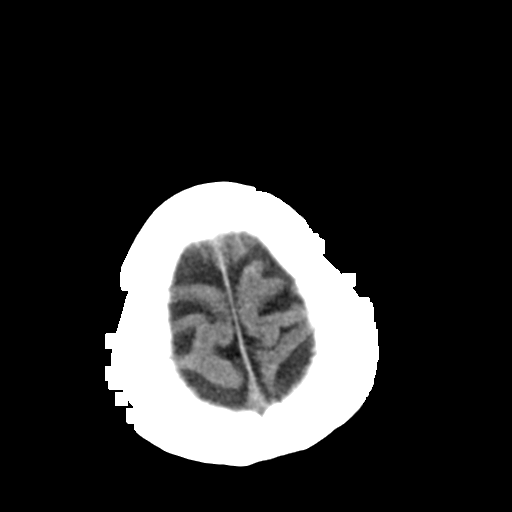

[Series 3: head bone · axial · 0.39mm/px · z∈[-164,-134]mm · 3 of 75 slices shown]
[im 8/75  bone]
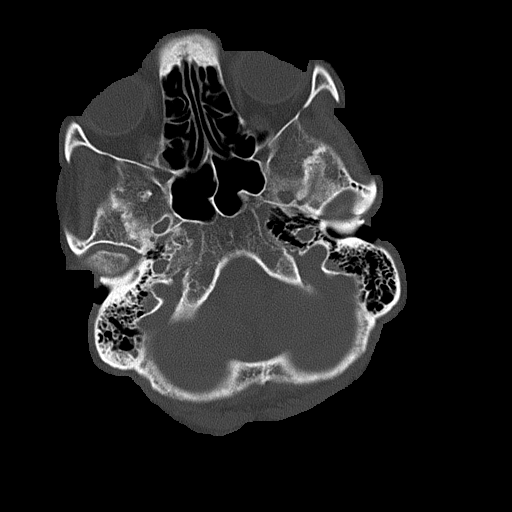
[im 15/75  bone]
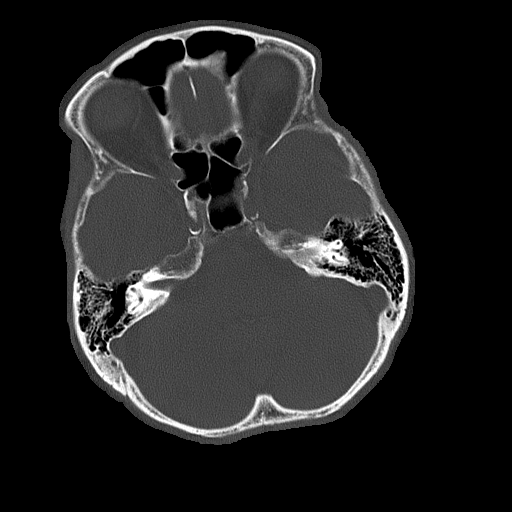
[im 23/75  bone]
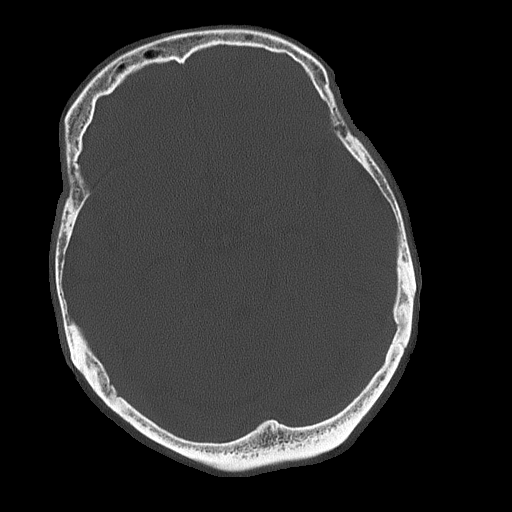

[Series 4: head without cor · coronal · non-contrast · 0.29mm/px · 3 of 67 slices shown]
[im 23/67  brain]
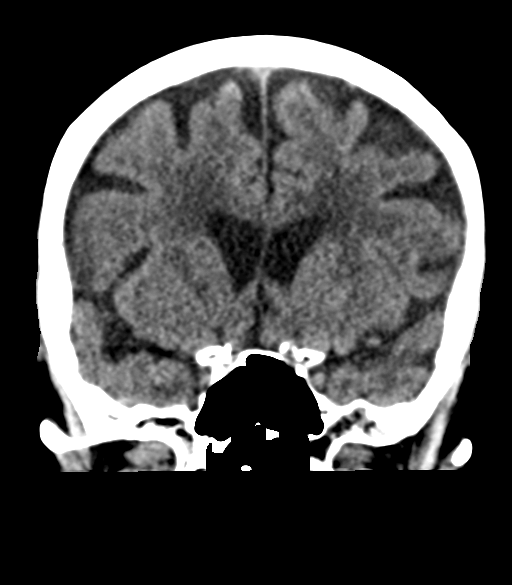
[im 30/67  brain]
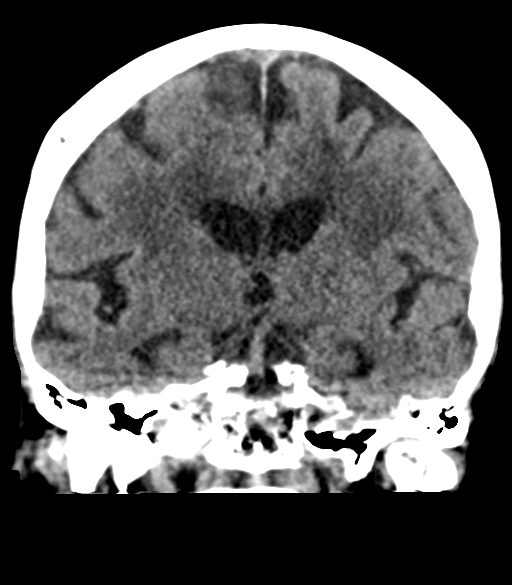
[im 37/67  brain]
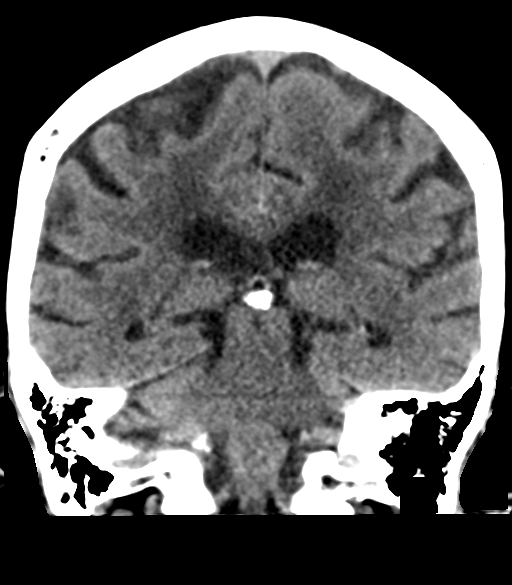

[Series 5: head without sag · sagittal · non-contrast · 0.29mm/px · 3 of 66 slices shown]
[im 22/66  brain]
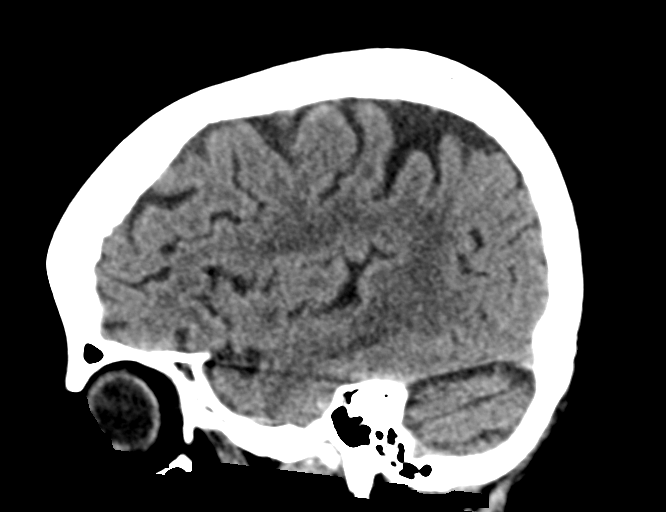
[im 33/66  brain]
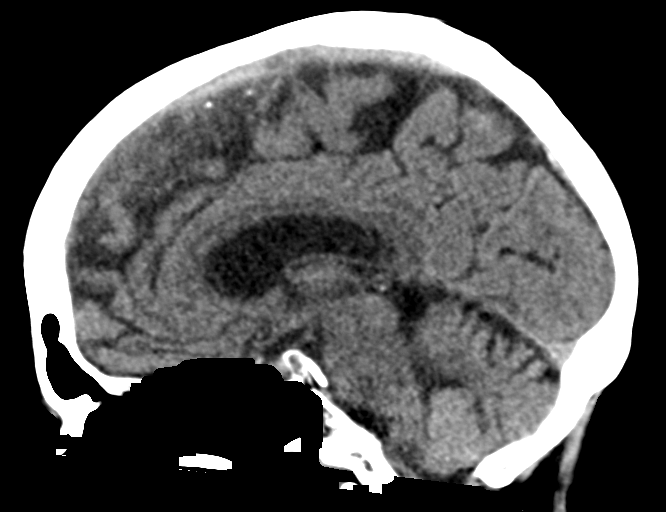
[im 44/66  brain]
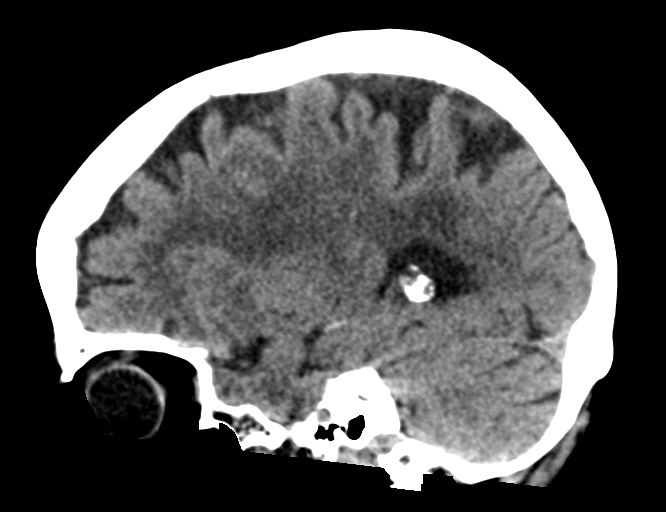

[16 of 47 positions shown; findings below may reference images not displayed]

FINDINGS: No evidence for acute infarction, hemorrhage, mass lesion,
hydrocephalus, or extra-axial fluid. Global atrophy, not unexpected
for age. Hypoattenuation of white matter favored to represent
chronic microvascular ischemic change. Calvarium intact. No sinus or
mastoid disease. Vascular calcification. Negative orbits. Similar
appearance to prior MR brain when technique differences are
considered.
IMPRESSION: Atrophy and small vessel disease.  No acute intracranial findings.
